# Patient Record
Sex: Female | Born: 1935 | Race: White | Hispanic: No | Marital: Married | Smoking: Never smoker
Health system: Southern US, Community
[De-identification: ages and names within clinical notes are randomized; demographics above are authoritative.]

## PROBLEM LIST (undated history)

## (undated) DIAGNOSIS — R0602 Shortness of breath: Secondary | ICD-10-CM

## (undated) DIAGNOSIS — K219 Gastro-esophageal reflux disease without esophagitis: Secondary | ICD-10-CM

## (undated) DIAGNOSIS — I10 Essential (primary) hypertension: Secondary | ICD-10-CM

## (undated) DIAGNOSIS — H209 Unspecified iridocyclitis: Secondary | ICD-10-CM

## (undated) DIAGNOSIS — J45909 Unspecified asthma, uncomplicated: Secondary | ICD-10-CM

## (undated) DIAGNOSIS — IMO0001 Reserved for inherently not codable concepts without codable children: Secondary | ICD-10-CM

## (undated) HISTORY — DX: Shortness of breath: R06.02

## (undated) HISTORY — PX: NO PAST SURGERIES: SHX2092

---

## 1998-12-16 ENCOUNTER — Other Ambulatory Visit: Admission: RE | Admit: 1998-12-16 | Discharge: 1998-12-16 | Payer: Self-pay | Admitting: *Deleted

## 1999-07-19 ENCOUNTER — Emergency Department (HOSPITAL_COMMUNITY): Admission: EM | Admit: 1999-07-19 | Discharge: 1999-07-19 | Payer: Self-pay

## 2000-05-08 ENCOUNTER — Other Ambulatory Visit: Admission: RE | Admit: 2000-05-08 | Discharge: 2000-05-08 | Payer: Self-pay | Admitting: *Deleted

## 2001-05-09 ENCOUNTER — Other Ambulatory Visit: Admission: RE | Admit: 2001-05-09 | Discharge: 2001-05-09 | Payer: Self-pay | Admitting: Family Medicine

## 2001-05-14 ENCOUNTER — Encounter: Payer: Self-pay | Admitting: Family Medicine

## 2001-05-14 ENCOUNTER — Encounter: Admission: RE | Admit: 2001-05-14 | Discharge: 2001-05-14 | Payer: Self-pay | Admitting: Family Medicine

## 2001-11-09 ENCOUNTER — Encounter: Admission: RE | Admit: 2001-11-09 | Discharge: 2001-11-09 | Payer: Self-pay | Admitting: Family Medicine

## 2001-11-09 ENCOUNTER — Encounter: Payer: Self-pay | Admitting: Family Medicine

## 2004-05-25 ENCOUNTER — Other Ambulatory Visit: Admission: RE | Admit: 2004-05-25 | Discharge: 2004-05-25 | Payer: Self-pay | Admitting: Family Medicine

## 2006-06-19 ENCOUNTER — Other Ambulatory Visit: Admission: RE | Admit: 2006-06-19 | Discharge: 2006-06-19 | Payer: Self-pay | Admitting: Family Medicine

## 2008-02-18 ENCOUNTER — Encounter: Admission: RE | Admit: 2008-02-18 | Discharge: 2008-02-18 | Payer: Self-pay | Admitting: Family Medicine

## 2008-06-16 ENCOUNTER — Encounter: Admission: RE | Admit: 2008-06-16 | Discharge: 2008-06-16 | Payer: Self-pay | Admitting: Family Medicine

## 2008-06-27 ENCOUNTER — Encounter: Admission: RE | Admit: 2008-06-27 | Discharge: 2008-06-27 | Payer: Self-pay | Admitting: Family Medicine

## 2008-12-04 ENCOUNTER — Encounter: Admission: RE | Admit: 2008-12-04 | Discharge: 2008-12-04 | Payer: Self-pay | Admitting: Family Medicine

## 2009-12-14 ENCOUNTER — Encounter: Admission: RE | Admit: 2009-12-14 | Discharge: 2009-12-14 | Payer: Self-pay | Admitting: Family Medicine

## 2010-06-17 ENCOUNTER — Other Ambulatory Visit: Payer: Self-pay | Admitting: Family Medicine

## 2010-06-17 DIAGNOSIS — R918 Other nonspecific abnormal finding of lung field: Secondary | ICD-10-CM

## 2010-06-23 ENCOUNTER — Ambulatory Visit
Admission: RE | Admit: 2010-06-23 | Discharge: 2010-06-23 | Disposition: A | Discharge status: Home / Self Care | Payer: Medicare Other | Source: Ambulatory Visit | Attending: Family Medicine | Admitting: Family Medicine

## 2010-06-23 DIAGNOSIS — R918 Other nonspecific abnormal finding of lung field: Secondary | ICD-10-CM

## 2010-06-23 MED ORDER — IOHEXOL 300 MG/ML  SOLN
75.0000 mL | Freq: Once | INTRAMUSCULAR | Status: AC | PRN
  Administered 2010-06-23: 75 mL via INTRAVENOUS

## 2014-05-27 ENCOUNTER — Encounter (HOSPITAL_COMMUNITY): Payer: Self-pay | Admitting: Family Medicine

## 2014-05-27 ENCOUNTER — Emergency Department (INDEPENDENT_AMBULATORY_CARE_PROVIDER_SITE_OTHER)
Admission: EM | Admit: 2014-05-27 | Discharge: 2014-05-27 | Disposition: A | Discharge status: Home / Self Care | Payer: Medicare Other | Source: Home / Self Care | Attending: Family Medicine | Admitting: Family Medicine

## 2014-05-27 DIAGNOSIS — R079 Chest pain, unspecified: Secondary | ICD-10-CM | POA: Diagnosis not present

## 2014-05-27 DIAGNOSIS — K219 Gastro-esophageal reflux disease without esophagitis: Secondary | ICD-10-CM

## 2014-05-27 DIAGNOSIS — M7918 Myalgia, other site: Secondary | ICD-10-CM

## 2014-05-27 DIAGNOSIS — IMO0001 Reserved for inherently not codable concepts without codable children: Secondary | ICD-10-CM

## 2014-05-27 DIAGNOSIS — M791 Myalgia: Secondary | ICD-10-CM | POA: Diagnosis not present

## 2014-05-27 HISTORY — DX: Unspecified iridocyclitis: H20.9

## 2014-05-27 HISTORY — DX: Essential (primary) hypertension: I10

## 2014-05-27 HISTORY — DX: Reserved for inherently not codable concepts without codable children: IMO0001

## 2014-05-27 HISTORY — DX: Gastro-esophageal reflux disease without esophagitis: K21.9

## 2014-05-27 MED ORDER — GI COCKTAIL ~~LOC~~
30.0000 mL | Freq: Once | ORAL | Status: AC
  Administered 2014-05-27: 30 mL via ORAL

## 2014-05-27 MED ORDER — GI COCKTAIL ~~LOC~~
ORAL | Status: AC
  Filled 2014-05-27: qty 30

## 2014-05-27 NOTE — Discharge Instructions (Signed)
Your chest pain does not appear to be due to your heart. Your likely splinting musculoskeletal pain in the form of costochondritis. Please consider using an anti-inflammatory such as ibuprofen for this pain and Zantac over the next several days. Presented to the emergency room if at any point if worse. Continue using her Zantac for the next several days.

## 2014-05-27 NOTE — ED Notes (Signed)
Reports chest pain with sob.  States within getting to clinic pain is now radiating to back.  States had similar episode on Friday.

## 2014-05-27 NOTE — ED Provider Notes (Signed)
CSN: [id number]     Arrival date & time 05/27/14  1238 History   First MD Initiated Contact with Patient 05/27/14 1249     Chief Complaint  Patient presents with  . Chest Pain   (Consider location/radiation/quality/duration/timing/severity/associated sxs/prior Treatment) HPI  CP started this morning while in bed. Zantac w/o benefit. Sharp. Took breath away/SOB. Center of chest. Started around 05:00. Pain is constant. Non-exertional. Increased work yesterday in the yard. Slept on R side. Still in pain but significantly improved. No radiatio. Back pain which is independent of CP. Denies HA, nausea, lightheadedness, diaphoresis, palpitations, syncope, vomiting, diarrhea, abd pain, dysuria, frequency, rash. Worse w/ certain moveements and breathing deeply.   Past Medical History  Diagnosis Date  . Iritis   . High blood pressure   . Reflux    History reviewed. No pertinent past surgical history. Family History  Problem Relation Age of Onset  . Congestive Heart Failure Father   . Congestive Heart Failure Brother   . Congestive Heart Failure Paternal Grandmother    History  Substance Use Topics  . Smoking status: Never Smoker   . Smokeless tobacco: Not on file  . Alcohol Use: No   OB History    No data available     Review of Systems Per HPI with all other pertinent systems negative.   Allergies  Review of patient's allergies indicates not on file.  Home Medications   Prior to Admission medications   Medication Sig Start Date End Date Taking? Authorizing Provider  famciclovir (FAMVIR) 500 MG tablet Take by mouth 3 (three) times daily.   Yes Historical Provider, MD  hydrochlorothiazide (HYDRODIURIL) 25 MG tablet Take 25 mg by mouth daily.   Yes Historical Provider, MD  prednisoLONE acetate (PRED FORTE) 1 % ophthalmic suspension 1 drop 4 (four) times daily.   Yes Historical Provider, MD  ranitidine (ZANTAC) 150 MG tablet Take 150 mg by mouth 2 (two) times daily.   Yes Historical  Provider, MD  vitamin B-12 (CYANOCOBALAMIN) 1000 MCG tablet Take 1,000 mcg by mouth daily.   Yes Historical Provider, MD   There were no vitals taken for this visit. Physical Exam Physical Exam  Constitutional: frail appearing. oriented to person, place, and time. appears well-developed and well-nourished. No distress.  HENT:  Head: Normocephalic and atraumatic.  Eyes: EOMI. PERRL.  Neck: Normal range of motion.  Cardiovascular: RRR, no m/r/g, 2+ distal pulses,  Pulmonary/Chest: Effort normal and breath sounds normal. No respiratory distress.  Abdominal: Soft. Bowel sounds are normal. NonTTP, no distension.  Musculoskeletal: Chest pain on palpation of costochondral border.  Neurological: alert and oriented to person, place, and time.  Skin: Skin is warm. No rash noted. non diaphoretic.  Psychiatric: normal mood and affect. behavior is normal. Judgment and thought content normal.   ED Course  Procedures (including critical care time) Labs Review Labs Reviewed - No data to display  Imaging Review No results found.  ED ECG REPORT  EKG ordered for CP and independently reviewed   Date: 05/27/2014  Rate: 75  Rhythm: normal sinus rhythm  QRS Axis: left  Intervals: normal  ST/T Wave abnormalities: normal  Conduction Disutrbances:none  Narrative Interpretation:   Old EKG Reviewed: none available  I have personally reviewed the EKG tracing and agree with the computerized printout as noted.  MDM   1. Chest pain, unspecified chest pain type   2. Musculoskeletal pain   3. Reflux    Relatively healthy 81 year old presenting with likely musculoskeletal chest pain.  Very reproducible on palpation. EKG reassuring. Anticipate patient overdid it from a physical standpoint yesterday working out in the yard and then slept on her side causing further irritation of the costochondral border resulting in costochondritis. Patient's chest pain is markedly improved with rest and sitting up. There  may also be a esophagitis component of this is patient with some underlying GERD and examined this morning. no initial benefit but now improving. GI cocktail and office without significantly reduced symptoms. Unlikely to be presenting with ischemic cardio myopathy or dissection, or pleuritic etiology of chest pain. Patient with very strict return precautions and will seek emergent medical attention if needed  Shelly Flatten, MD Family Medicine 05/27/2014, 1:19 PM     Ozella Rocks, MD 05/27/14 1321

## 2014-07-15 ENCOUNTER — Other Ambulatory Visit: Payer: Self-pay | Admitting: Family Medicine

## 2014-07-15 DIAGNOSIS — R1900 Intra-abdominal and pelvic swelling, mass and lump, unspecified site: Secondary | ICD-10-CM

## 2014-07-17 ENCOUNTER — Ambulatory Visit
Admission: RE | Admit: 2014-07-17 | Discharge: 2014-07-17 | Disposition: A | Discharge status: Home / Self Care | Payer: Medicare Other | Source: Ambulatory Visit | Attending: Family Medicine | Admitting: Family Medicine

## 2014-07-17 DIAGNOSIS — R1900 Intra-abdominal and pelvic swelling, mass and lump, unspecified site: Secondary | ICD-10-CM

## 2014-07-17 MED ORDER — IOHEXOL 300 MG/ML  SOLN
100.0000 mL | Freq: Once | INTRAMUSCULAR | Status: AC | PRN
  Administered 2014-07-17: 100 mL via INTRAVENOUS

## 2014-07-22 ENCOUNTER — Other Ambulatory Visit: Payer: Self-pay | Admitting: Family Medicine

## 2014-07-22 ENCOUNTER — Other Ambulatory Visit (HOSPITAL_COMMUNITY): Payer: Self-pay | Admitting: Family Medicine

## 2014-07-22 DIAGNOSIS — R911 Solitary pulmonary nodule: Secondary | ICD-10-CM

## 2014-07-24 ENCOUNTER — Ambulatory Visit (HOSPITAL_COMMUNITY): Payer: Medicare Other

## 2014-07-25 ENCOUNTER — Ambulatory Visit (HOSPITAL_COMMUNITY)
Admission: RE | Admit: 2014-07-25 | Discharge: 2014-07-25 | Disposition: A | Discharge status: Home / Self Care | Payer: Medicare Other | Source: Ambulatory Visit | Attending: Family Medicine | Admitting: Family Medicine

## 2014-07-25 ENCOUNTER — Other Ambulatory Visit: Payer: Medicare Other

## 2014-07-25 DIAGNOSIS — R590 Localized enlarged lymph nodes: Secondary | ICD-10-CM | POA: Diagnosis not present

## 2014-07-25 DIAGNOSIS — R911 Solitary pulmonary nodule: Secondary | ICD-10-CM | POA: Diagnosis present

## 2014-07-25 DIAGNOSIS — I700 Atherosclerosis of aorta: Secondary | ICD-10-CM | POA: Diagnosis not present

## 2014-07-25 DIAGNOSIS — R918 Other nonspecific abnormal finding of lung field: Secondary | ICD-10-CM | POA: Diagnosis not present

## 2014-07-25 DIAGNOSIS — I2510 Atherosclerotic heart disease of native coronary artery without angina pectoris: Secondary | ICD-10-CM | POA: Insufficient documentation

## 2014-07-25 LAB — GLUCOSE, CAPILLARY: Glucose-Capillary: 105 mg/dL — ABNORMAL HIGH (ref 65–99)

## 2014-07-25 MED ORDER — FLUDEOXYGLUCOSE F - 18 (FDG) INJECTION
6.8400 | Freq: Once | INTRAVENOUS | Status: AC | PRN
Start: 2014-07-25 — End: 2014-07-25
  Administered 2014-07-25: 6.84 via INTRAVENOUS

## 2014-07-28 ENCOUNTER — Other Ambulatory Visit: Payer: Self-pay | Admitting: General Surgery

## 2014-07-28 ENCOUNTER — Encounter (HOSPITAL_BASED_OUTPATIENT_CLINIC_OR_DEPARTMENT_OTHER): Payer: Self-pay | Admitting: *Deleted

## 2014-07-28 NOTE — H&P (Signed)
Anne Bradshaw 07/28/2014 11:57 AM Location: [facility] Patient #: [id number] DOB: 07/10/1927 Married / Language: English / Race: White Female History of Present Illness (Lucita Montoya MD; 07/28/2014 4:06 PM) The patient is a 81 year old female who presents with lymphadenopathy. Patient is an 81-year-old female who presents with right inguinal lymphadenopathy. She has had enlarged lymph nodes in her right groin for approximately 2 years. She had been doing some yard work and noted some left-sided lymphadenopathy and a rash in her groin. She has had this seen by multiple people including dermatology. She has had it treated as a fungal infection but this is not resolved to fungal treatment. She later had the left-sided lymphadenopathy resolved and started developing right-sided lymphadenopathy. She is not sure if it has changed since that has been noticed. It is not painful. She continues to have a reddish purplish discoloration of her skin over her mons pubis. This does seem to be extending. It is uncomfortable, but her lymph nodes are not painful. She underwent PET scanning which did not demonstrate any areas of hyper metabolism elsewhere other than in her lungs. She is felt to have patchy infiltrates consistent with a low-grade pneumonia. This is confusing to her because she states that she feels fine and is able to mostly whatever she wants. She picks her vegetables out of her garden without any significant difficulty. Other Problems (Sonya Bynum, CMA; 07/28/2014 11:57 AM) Asthma Gastroesophageal Reflux Disease Hemorrhoids High blood pressure Kidney Stone Migraine Headache  Past Surgical History (Sonya Bynum, CMA; 07/28/2014 11:57 AM) No pertinent past surgical history  Diagnostic Studies History (Sonya Bynum, CMA; 07/28/2014 11:57 AM) Colonoscopy never Mammogram >3 years ago Pap Smear >5 years ago  Allergies (Sonya Bynum, CMA; 07/28/2014 11:59 AM) No Known Drug  Allergies 07/28/2014  Medication History (Sonya Bynum, CMA; 07/28/2014 11:59 AM) Famciclovir (500MG Tablet, Oral) Active. Hydrochlorothiazide (25MG Tablet, Oral) Active. Ketoconazole (2% Cream, External) Active. Fluticasone Propionate (0.05% Cream, External) Active. Medications Reconciled  Social History (Sonya Bynum, CMA; 07/28/2014 11:57 AM) Caffeine use Coffee. No alcohol use No drug use Tobacco use Never smoker.  Family History (Sonya Bynum, CMA; 07/28/2014 11:57 AM) Cerebrovascular Accident Father, Mother. Heart Disease Brother, Father. Hypertension Brother, Daughter, Father.  Pregnancy / Birth History (Sonya Bynum, CMA; 07/28/2014 11:57 AM) Age at menarche 16 years. Age of menopause 46-50 Gravida 4 Maternal age 15-20 Para 3     Review of Systems (Sonya Bynum CMA; 07/28/2014 11:57 AM) General Present- Weight Loss. Not Present- Appetite Loss, Chills, Fatigue, Fever, Night Sweats and Weight Gain. Skin Not Present- Change in Wart/Mole, Dryness, Hives, Jaundice, New Lesions, Non-Healing Wounds, Rash and Ulcer. HEENT Present- Seasonal Allergies, Visual Disturbances and Wears glasses/contact lenses. Not Present- Earache, Hearing Loss, Hoarseness, Nose Bleed, Oral Ulcers, Ringing in the Ears, Sinus Pain, Sore Throat and Yellow Eyes. Respiratory Present- Snoring. Not Present- Bloody sputum, Chronic Cough, Difficulty Breathing and Wheezing. Breast Not Present- Breast Mass, Breast Pain, Nipple Discharge and Skin Changes. Cardiovascular Present- Leg Cramps. Not Present- Chest Pain, Difficulty Breathing Lying Down, Palpitations, Rapid Heart Rate, Shortness of Breath and Swelling of Extremities. Gastrointestinal Not Present- Abdominal Pain, Bloating, Bloody Stool, Change in Bowel Habits, Chronic diarrhea, Constipation, Difficulty Swallowing, Excessive gas, Gets full quickly at meals, Hemorrhoids, Indigestion, Nausea, Rectal Pain and Vomiting. Female Genitourinary Not Present-  Frequency, Nocturia, Painful Urination, Pelvic Pain and Urgency. Musculoskeletal Not Present- Back Pain, Joint Pain, Joint Stiffness, Muscle Pain, Muscle Weakness and Swelling of Extremities. Neurological Not Present- Decreased Memory,   Fainting, Headaches, Numbness, Seizures, Tingling, Tremor, Trouble walking and Weakness. Psychiatric Not Present- Anxiety, Bipolar, Change in Sleep Pattern, Depression, Fearful and Frequent crying. Endocrine Present- Cold Intolerance and Hair Changes. Not Present- Excessive Hunger, Heat Intolerance, Hot flashes and New Diabetes. Hematology Present- Gland problems. Not Present- Easy Bruising, Excessive bleeding, HIV and Persistent Infections.  Vitals (Sonya Bynum CMA; 07/28/2014 11:58 AM) 07/28/2014 11:58 AM Weight: 105.6 lb Height: 60in Body Surface Area: 1.42 m Body Mass Index: 20.62 kg/m Temp.: 97.4F(Temporal)  Pulse: 50 (Regular)  BP: 132/80 (Sitting, Left Arm, Standard)     Physical Exam (Arleta Ostrum MD; 07/28/2014 4:07 PM)  General Mental Status-Alert. General Appearance-Consistent with stated age. Hydration-Well hydrated. Voice-Normal.  Integumentary Note: Reddish purplish discoloration that blanches. She has edema over her mons. She has no peripheral edema.   Head and Neck Head-normocephalic, atraumatic with no lesions or palpable masses. Trachea-midline. Thyroid Gland Characteristics - normal size and consistency.  Eye Eyeball - Bilateral-Extraocular movements intact. Sclera/Conjunctiva - Bilateral-No scleral icterus.  Chest and Lung Exam Chest and lung exam reveals -quiet, even and easy respiratory effort with no use of accessory muscles and on auscultation, normal breath sounds, no adventitious sounds and normal vocal resonance. Inspection Chest Wall - Normal. Back - normal.  Cardiovascular Cardiovascular examination reveals -normal heart sounds, regular rate and rhythm with no murmurs and normal  pedal pulses bilaterally.  Abdomen Inspection Inspection of the abdomen reveals - No Hernias. Palpation/Percussion Palpation and Percussion of the abdomen reveal - Soft, Non Tender, No Rebound tenderness, No Rigidity (guarding) and No hepatosplenomegaly. Auscultation Auscultation of the abdomen reveals - Bowel sounds normal.  Neurologic Neurologic evaluation reveals -alert and oriented x 3 with no impairment of recent or remote memory. Mental Status-Normal.  Musculoskeletal Global Assessment -Note:no gross deformities.  Normal Exam - Left-Upper Extremity Strength Normal and Lower Extremity Strength Normal. Normal Exam - Right-Upper Extremity Strength Normal and Lower Extremity Strength Normal.  Lymphatic Head & Neck  General Head & Neck Lymphatics: Bilateral - Description - Normal. Axillary  General Axillary Region: Bilateral - Description - Normal. Tenderness - Non Tender. Femoral & Inguinal  Generalized Femoral & Inguinal Lymphatics: Bilateral - Description - Localized lymphadenopathy(Right matted lymphadenopathy.).    Assessment & Plan (Yanni Quiroa MD; 07/28/2014 4:08 PM)  LYMPHADENOPATHY, INGUINAL (785.6  R59.0) Impression: We will plan for a right inguinal lymph node biopsy. I discussed the risk of the procedure including bleeding, infection, damage to adjacent structure, seroma, lymphocele, leg swelling, non-diagnosis, possible heart-lung complications, and others.  Current Plans Schedule for Surgery Pt Education - Lymph Nodes, Enlarged: enlarged gland RASH OF UNKNOWN CAUSE (782.1  R21) Impression: She is asleep, I will do a skin biopsy of the affected area since she has had this treated for several years without result. 

## 2014-07-29 ENCOUNTER — Ambulatory Visit (HOSPITAL_BASED_OUTPATIENT_CLINIC_OR_DEPARTMENT_OTHER): Payer: Medicare Other | Admitting: Anesthesiology

## 2014-07-29 ENCOUNTER — Encounter (HOSPITAL_BASED_OUTPATIENT_CLINIC_OR_DEPARTMENT_OTHER)
Admission: RE | Disposition: A | Discharge status: Home / Self Care | Payer: Self-pay | Source: Ambulatory Visit | Attending: General Surgery

## 2014-07-29 ENCOUNTER — Encounter (HOSPITAL_BASED_OUTPATIENT_CLINIC_OR_DEPARTMENT_OTHER): Payer: Self-pay | Admitting: Anesthesiology

## 2014-07-29 ENCOUNTER — Ambulatory Visit (HOSPITAL_BASED_OUTPATIENT_CLINIC_OR_DEPARTMENT_OTHER)
Admission: RE | Admit: 2014-07-29 | Discharge: 2014-07-29 | Disposition: A | Discharge status: Home / Self Care | Payer: Medicare Other | Source: Ambulatory Visit | Attending: General Surgery | Admitting: General Surgery

## 2014-07-29 DIAGNOSIS — I889 Nonspecific lymphadenitis, unspecified: Secondary | ICD-10-CM | POA: Insufficient documentation

## 2014-07-29 DIAGNOSIS — I10 Essential (primary) hypertension: Secondary | ICD-10-CM | POA: Diagnosis not present

## 2014-07-29 DIAGNOSIS — K219 Gastro-esophageal reflux disease without esophagitis: Secondary | ICD-10-CM | POA: Diagnosis not present

## 2014-07-29 DIAGNOSIS — R599 Enlarged lymph nodes, unspecified: Secondary | ICD-10-CM | POA: Diagnosis present

## 2014-07-29 HISTORY — DX: Gastro-esophageal reflux disease without esophagitis: K21.9

## 2014-07-29 HISTORY — PX: SKIN BIOPSY: SHX1

## 2014-07-29 HISTORY — PX: LYMPH NODE BIOPSY: SHX201

## 2014-07-29 HISTORY — DX: Unspecified asthma, uncomplicated: J45.909

## 2014-07-29 LAB — POCT I-STAT, CHEM 8
BUN: 15 mg/dL (ref 6–20)
CHLORIDE: 102 mmol/L (ref 101–111)
Calcium, Ion: 1.19 mmol/L (ref 1.13–1.30)
Creatinine, Ser: 0.8 mg/dL (ref 0.44–1.00)
GLUCOSE: 104 mg/dL — AB (ref 65–99)
HCT: 40 % (ref 36.0–46.0)
Hemoglobin: 13.6 g/dL (ref 12.0–15.0)
Potassium: 3.8 mmol/L (ref 3.5–5.1)
Sodium: 140 mmol/L (ref 135–145)
TCO2: 25 mmol/L (ref 0–100)

## 2014-07-29 SURGERY — LYMPH NODE BIOPSY
Anesthesia: General | Site: Abdomen | Laterality: Right

## 2014-07-29 MED ORDER — MIDAZOLAM HCL 2 MG/2ML IJ SOLN: 1.0000 mg | INTRAMUSCULAR | Status: DC | PRN

## 2014-07-29 MED ORDER — LIDOCAINE-EPINEPHRINE (PF) 1 %-1:200000 IJ SOLN
INTRAMUSCULAR | Status: DC | PRN
  Administered 2014-07-29: 12 mL

## 2014-07-29 MED ORDER — OXYCODONE HCL 5 MG/5ML PO SOLN
5.0000 mg | Freq: Once | ORAL | Status: AC | PRN
Start: 2014-07-29 — End: 2014-07-29

## 2014-07-29 MED ORDER — GLYCOPYRROLATE 0.2 MG/ML IJ SOLN: 0.2000 mg | Freq: Once | INTRAMUSCULAR | Status: DC | PRN

## 2014-07-29 MED ORDER — CEFAZOLIN SODIUM-DEXTROSE 2-3 GM-% IV SOLR
INTRAVENOUS | Status: AC
  Filled 2014-07-29: qty 50

## 2014-07-29 MED ORDER — OXYCODONE HCL 5 MG PO TABS
5.0000 mg | ORAL_TABLET | Freq: Once | ORAL | Status: AC | PRN
  Administered 2014-07-29: 5 mg via ORAL

## 2014-07-29 MED ORDER — FENTANYL CITRATE (PF) 100 MCG/2ML IJ SOLN
INTRAMUSCULAR | Status: AC
  Filled 2014-07-29: qty 2

## 2014-07-29 MED ORDER — SODIUM CHLORIDE 0.9 % IV SOLN: 250.0000 mL | INTRAVENOUS | Status: DC | PRN

## 2014-07-29 MED ORDER — FENTANYL CITRATE (PF) 100 MCG/2ML IJ SOLN
INTRAMUSCULAR | Status: AC
  Filled 2014-07-29: qty 4

## 2014-07-29 MED ORDER — FENTANYL CITRATE (PF) 100 MCG/2ML IJ SOLN
25.0000 ug | INTRAMUSCULAR | Status: DC | PRN
  Administered 2014-07-29 (×2): 25 ug via INTRAVENOUS

## 2014-07-29 MED ORDER — ACETAMINOPHEN 650 MG RE SUPP: 650.0000 mg | RECTAL | Status: DC | PRN

## 2014-07-29 MED ORDER — ONDANSETRON HCL 4 MG/2ML IJ SOLN
INTRAMUSCULAR | Status: DC | PRN
  Administered 2014-07-29: 4 mg via INTRAVENOUS

## 2014-07-29 MED ORDER — LACTATED RINGERS IV SOLN
INTRAVENOUS | Status: DC
  Administered 2014-07-29 (×2): via INTRAVENOUS

## 2014-07-29 MED ORDER — MIDAZOLAM HCL 2 MG/2ML IJ SOLN
INTRAMUSCULAR | Status: AC
  Filled 2014-07-29: qty 2

## 2014-07-29 MED ORDER — CEFAZOLIN SODIUM-DEXTROSE 2-3 GM-% IV SOLR
2.0000 g | INTRAVENOUS | Status: AC
  Administered 2014-07-29: 2 g via INTRAVENOUS

## 2014-07-29 MED ORDER — SODIUM CHLORIDE 0.9 % IJ SOLN: 3.0000 mL | Freq: Two times a day (BID) | INTRAMUSCULAR | Status: DC

## 2014-07-29 MED ORDER — OXYCODONE HCL 5 MG PO TABS: 5.0000 mg | ORAL_TABLET | ORAL | Status: DC | PRN

## 2014-07-29 MED ORDER — FENTANYL CITRATE (PF) 100 MCG/2ML IJ SOLN: 50.0000 ug | INTRAMUSCULAR | Status: DC | PRN

## 2014-07-29 MED ORDER — SODIUM CHLORIDE 0.9 % IJ SOLN: 3.0000 mL | INTRAMUSCULAR | Status: DC | PRN

## 2014-07-29 MED ORDER — OXYCODONE HCL 5 MG PO TABS
ORAL_TABLET | ORAL | Status: AC
Start: 2014-07-29 — End: 2014-07-29
  Filled 2014-07-29: qty 1

## 2014-07-29 MED ORDER — TRAMADOL HCL 50 MG PO TABS: 50.0000 mg | ORAL_TABLET | Freq: Four times a day (QID) | ORAL | Status: DC | PRN

## 2014-07-29 MED ORDER — PROPOFOL INFUSION 10 MG/ML OPTIME
INTRAVENOUS | Status: DC | PRN
  Administered 2014-07-29: 25 ug/kg/min via INTRAVENOUS

## 2014-07-29 MED ORDER — FENTANYL CITRATE (PF) 100 MCG/2ML IJ SOLN
INTRAMUSCULAR | Status: DC | PRN
  Administered 2014-07-29 (×2): 25 ug via INTRAVENOUS

## 2014-07-29 MED ORDER — LIDOCAINE-EPINEPHRINE (PF) 1 %-1:200000 IJ SOLN
INTRAMUSCULAR | Status: AC
  Filled 2014-07-29: qty 10

## 2014-07-29 MED ORDER — ACETAMINOPHEN 325 MG PO TABS: 650.0000 mg | ORAL_TABLET | ORAL | Status: DC | PRN

## 2014-07-29 MED ORDER — ONDANSETRON HCL 4 MG/2ML IJ SOLN: 4.0000 mg | Freq: Once | INTRAMUSCULAR | Status: DC | PRN

## 2014-07-29 SURGICAL SUPPLY — 48 items
BLADE HEX COATED 2.75 (ELECTRODE) ×2 IMPLANT
BLADE SURG 10 STRL SS (BLADE) IMPLANT
BLADE SURG 15 STRL LF DISP TIS (BLADE) ×1 IMPLANT
BLADE SURG 15 STRL SS (BLADE) ×2
CANISTER SUCT 1200ML W/VALVE (MISCELLANEOUS) IMPLANT
CHLORAPREP W/TINT 26ML (MISCELLANEOUS) ×2 IMPLANT
CLIP TI MEDIUM 6 (CLIP) ×4 IMPLANT
COVER BACK TABLE 60X90IN (DRAPES) ×2 IMPLANT
COVER MAYO STAND STRL (DRAPES) ×2 IMPLANT
DECANTER SPIKE VIAL GLASS SM (MISCELLANEOUS) IMPLANT
DRAPE LAPAROTOMY 100X72 PEDS (DRAPES) ×2 IMPLANT
DRAPE UTILITY XL STRL (DRAPES) ×2 IMPLANT
DRSG TELFA 3X8 NADH (GAUZE/BANDAGES/DRESSINGS) ×2 IMPLANT
ELECT REM PT RETURN 9FT ADLT (ELECTROSURGICAL) ×2
ELECTRODE REM PT RTRN 9FT ADLT (ELECTROSURGICAL) ×1 IMPLANT
GLOVE BIO SURGEON STRL SZ 6 (GLOVE) ×2 IMPLANT
GLOVE BIO SURGEON STRL SZ 6.5 (GLOVE) ×1 IMPLANT
GLOVE BIOGEL PI IND STRL 6.5 (GLOVE) ×1 IMPLANT
GLOVE BIOGEL PI IND STRL 7.0 (GLOVE) IMPLANT
GLOVE BIOGEL PI INDICATOR 6.5 (GLOVE) ×1
GLOVE BIOGEL PI INDICATOR 7.0 (GLOVE) ×1
GLOVE ECLIPSE 6.5 STRL STRAW (GLOVE) ×1 IMPLANT
GOWN STRL REUS W/ TWL LRG LVL3 (GOWN DISPOSABLE) ×1 IMPLANT
GOWN STRL REUS W/TWL 2XL LVL3 (GOWN DISPOSABLE) ×2 IMPLANT
GOWN STRL REUS W/TWL LRG LVL3 (GOWN DISPOSABLE) ×2
LIQUID BAND (GAUZE/BANDAGES/DRESSINGS) ×2 IMPLANT
NDL HYPO 25X1 1.5 SAFETY (NEEDLE) ×1 IMPLANT
NEEDLE HYPO 25X1 1.5 SAFETY (NEEDLE) ×2 IMPLANT
NS IRRIG 1000ML POUR BTL (IV SOLUTION) ×1 IMPLANT
PACK BASIN DAY SURGERY FS (CUSTOM PROCEDURE TRAY) ×2 IMPLANT
PAD DRESSING TELFA 3X8 NADH (GAUZE/BANDAGES/DRESSINGS) IMPLANT
PENCIL BUTTON HOLSTER BLD 10FT (ELECTRODE) ×2 IMPLANT
PUNCH BIOPSY DERMAL 3MM (MISCELLANEOUS) ×1 IMPLANT
SLEEVE SCD COMPRESS KNEE MED (MISCELLANEOUS) IMPLANT
SPONGE LAP 18X18 X RAY DECT (DISPOSABLE) ×2 IMPLANT
STAPLER VISISTAT 35W (STAPLE) IMPLANT
SUT MON AB 4-0 PC3 18 (SUTURE) ×1 IMPLANT
SUT VIC AB 2-0 SH 27 (SUTURE) ×2
SUT VIC AB 2-0 SH 27XBRD (SUTURE) IMPLANT
SUT VIC AB 3-0 54X BRD REEL (SUTURE) IMPLANT
SUT VIC AB 3-0 BRD 54 (SUTURE)
SUT VIC AB 3-0 SH 27 (SUTURE) ×2
SUT VIC AB 3-0 SH 27X BRD (SUTURE) IMPLANT
SYR CONTROL 10ML LL (SYRINGE) ×2 IMPLANT
TOWEL OR 17X24 6PK STRL BLUE (TOWEL DISPOSABLE) ×2 IMPLANT
TOWEL OR NON WOVEN STRL DISP B (DISPOSABLE) ×2 IMPLANT
TUBE CONNECTING 20X1/4 (TUBING) ×1 IMPLANT
YANKAUER SUCT BULB TIP NO VENT (SUCTIONS) ×1 IMPLANT

## 2014-07-29 NOTE — Transfer of Care (Signed)
Immediate Anesthesia Transfer of Care Note  Patient: Anne Bradshaw  Procedure(s) Performed: Procedure(s): RIGHT INGUINAL LYMPH NODE BIOPSY (Right) SKIN PUNCH BIOPSY (Right)  Patient Location: PACU  Anesthesia Type:MAC  Level of Consciousness: awake and patient cooperative  Airway & Oxygen Therapy: Patient Spontanous Breathing  Post-op Assessment: Report given to RN and Post -op Vital signs reviewed and stable  Post vital signs: Reviewed and stable  Last Vitals:  Filed Vitals:   07/29/14 1208  BP: 170/65  Pulse: 57  Temp: 36.7 C  Resp: 16    Complications: No apparent anesthesia complications

## 2014-07-29 NOTE — Anesthesia Postprocedure Evaluation (Signed)
Anesthesia Post Note  Patient: Anne Bradshaw  Procedure(s) Performed: Procedure(s) (LRB): RIGHT INGUINAL LYMPH NODE BIOPSY (Right) SKIN PUNCH BIOPSY (Right)  Anesthesia type: MAC  Patient location: PACU  Post pain: Pain level controlled and Adequate analgesia  Post assessment: Post-op Vital signs reviewed, Patient's Cardiovascular Status Stable and Respiratory Function Stable  Last Vitals:  Filed Vitals:   07/29/14 1615  BP: 154/48  Pulse: 68  Temp: 36.7 C  Resp: 20    Post vital signs: Reviewed and stable  Level of consciousness: awake, alert  and oriented  Complications: No apparent anesthesia complications

## 2014-07-29 NOTE — Anesthesia Procedure Notes (Signed)
Procedure Name: MAC Date/Time: 07/29/2014 1:43 PM Performed by: Malverne DesanctisLINKA, [facility] Pre-anesthesia Checklist: Patient identified, Timeout performed, Emergency Drugs available, Suction available and Patient being monitored Patient Re-evaluated:Patient Re-evaluated prior to inductionOxygen Delivery Method: Simple face mask

## 2014-07-29 NOTE — Interval H&P Note (Signed)
History and Physical Interval Note:  07/29/2014 12:04 PM  Anne Bradshaw  has presented today for surgery, with the diagnosis of LYMPHADENAPATHY, RASH  The various methods of treatment have been discussed with the patient and family. After consideration of risks, benefits and other options for treatment, the patient has consented to  Procedure(s): RIGHT INGUINAL LYMPH NODE BIOPSY (Right) SKIN PUNCH BIOPSY (N/A) as a surgical intervention .  The patient's history has been reviewed, patient examined, no change in status, stable for surgery.  I have reviewed the patient's chart and labs.  Questions were answered to the patient's satisfaction.     Royden Bulman

## 2014-07-29 NOTE — Discharge Instructions (Addendum)
[facility] °Office Phone Number [phone] ° ° POST OP INSTRUCTIONS ° °Always review your discharge instruction sheet given to you by the facility where your surgery was performed. ° °IF YOU HAVE DISABILITY OR FAMILY LEAVE FORMS, YOU MUST BRING THEM TO THE OFFICE FOR PROCESSING.  DO NOT GIVE THEM TO YOUR DOCTOR. ° °1. A prescription for pain medication may be given to you upon discharge.  Take your pain medication as prescribed, if needed.  If narcotic pain medicine is not needed, then you may take acetaminophen (Tylenol) or ibuprofen (Advil) as needed. °2. Take your usually prescribed medications unless otherwise directed °3. If you need a refill on your pain medication, please contact your pharmacy.  They will contact our office to request authorization.  Prescriptions will not be filled after 5pm or on week-ends. °4. You should eat very light the first 24 hours after surgery, such as soup, crackers, pudding, etc.  Resume your normal diet the day after surgery °5. It is common to experience some constipation if taking pain medication after surgery.  Increasing fluid intake and taking a stool softener will usually help or prevent this problem from occurring.  A mild laxative (Milk of Magnesia or Miralax) should be taken according to package directions if there are no bowel movements after 48 hours. °6. You may shower in 48 hours.  The surgical glue will flake off in 2-3 weeks.   °7. ACTIVITIES:  No strenuous activity or heavy lifting for 1 week.   °a. You may drive when you no longer are taking prescription pain medication, you can comfortably wear a seatbelt, and you can safely maneuver your car and apply brakes. °b. RETURN TO WORK:  _________1 week_____________ °You should see your doctor in the office for a follow-up appointment approximately three-four weeks after your surgery.   ° °WHEN TO CALL YOUR DOCTOR: °1. Fever over 101.0 °2. Nausea and/or vomiting. °3. Extreme swelling or  bruising. °4. Continued bleeding from incision. °5. Increased pain, redness, or drainage from the incision. ° °The clinic staff is available to answer your questions during regular business hours.  Please don’t hesitate to call and ask to speak to one of the nurses for clinical concerns.  If you have a medical emergency, go to the nearest emergency room or call 911.  A surgeon from [facility] is always on call at the hospital. ° °For further questions, please visit [email or web address]  ° ° °Post Anesthesia Home Care Instructions ° °Activity: °Get plenty of rest for the remainder of the day. A responsible adult should stay with you for 24 hours following the procedure.  °For the next 24 hours, DO NOT: °-Drive a car °-Operate machinery °-Drink alcoholic beverages °-Take any medication unless instructed by your physician °-Make any legal decisions or sign important papers. ° °Meals: °Start with liquid foods such as gelatin or soup. Progress to regular foods as tolerated. Avoid greasy, spicy, heavy foods. If nausea and/or vomiting occur, drink only clear liquids until the nausea and/or vomiting subsides. Call your physician if vomiting continues. ° °Special Instructions/Symptoms: °Your throat may feel dry or sore from the anesthesia or the breathing tube placed in your throat during surgery. If this causes discomfort, gargle with warm salt water. The discomfort should disappear within 24 hours. ° °If you had a scopolamine patch placed behind your ear for the management of post- operative nausea and/or vomiting: ° °1. The medication in the patch is effective for 72 hours, after which it should   be removed.  Wrap patch in a tissue and discard in the trash. Wash hands thoroughly with soap and water. °2. You may remove the patch earlier than 72 hours if you experience unpleasant side effects which may include dry mouth, dizziness or visual disturbances. °3. Avoid touching the patch. Wash your hands with  soap and water after contact with the patch. °  ° ° °

## 2014-07-29 NOTE — Anesthesia Preprocedure Evaluation (Signed)
Anesthesia Evaluation  Patient identified by MRN, date of birth, ID band Patient awake    Reviewed: Allergy & Precautions, NPO status , Patient's Chart, lab work & pertinent test results  Airway Mallampati: II   Neck ROM: full    Dental   Pulmonary asthma ,  breath sounds clear to auscultation        Cardiovascular hypertension, Rhythm:regular Rate:Normal     Neuro/Psych    GI/Hepatic GERD-  ,  Endo/Other    Renal/GU      Musculoskeletal   Abdominal   Peds  Hematology   Anesthesia Other Findings   Reproductive/Obstetrics                             Anesthesia Physical Anesthesia Plan  ASA: II  Anesthesia Plan: General   Post-op Pain Management:    Induction: Intravenous  Airway Management Planned: LMA  Additional Equipment:   Intra-op Plan:   Post-operative Plan:   Informed Consent: I have reviewed the patients History and Physical, chart, labs and discussed the procedure including the risks, benefits and alternatives for the proposed anesthesia with the patient or authorized representative who has indicated his/her understanding and acceptance.     Plan Discussed with: CRNA, Anesthesiologist and Surgeon  Anesthesia Plan Comments:         Anesthesia Quick Evaluation

## 2014-07-29 NOTE — H&P (View-Only) (Signed)
Anne Bradshaw 07/28/2014 11:57 AM Location: [facility] Patient #: [id number] DOB: [birth year] Married / Language: [place] / Race: White Female History of Present Illness Anne Lint MD; 07/28/2014 4:06 PM) The patient is a 81 year old female who presents with lymphadenopathy. Patient is an 81 year old female who presents with right inguinal lymphadenopathy. She has had enlarged lymph nodes in her right groin for approximately 2 years. She had been doing some yard work and noted some left-sided lymphadenopathy and a rash in her groin. She has had this seen by multiple people including dermatology. She has had it treated as a fungal infection but this is not resolved to fungal treatment. She later had the left-sided lymphadenopathy resolved and started developing right-sided lymphadenopathy. She is not sure if it has changed since that has been noticed. It is not painful. She continues to have a reddish purplish discoloration of her skin over her mons pubis. This does seem to be extending. It is uncomfortable, but her lymph nodes are not painful. She underwent PET scanning which did not demonstrate any areas of hyper metabolism elsewhere other than in her lungs. She is felt to have patchy infiltrates consistent with a low-grade pneumonia. This is confusing to her because she states that she feels fine and is able to mostly whatever she wants. She picks her vegetables out of her garden without any significant difficulty. Other Problems Anne Bradshaw, CMA; 07/28/2014 11:57 AM) Asthma Gastroesophageal Reflux Disease Hemorrhoids High blood pressure Kidney Stone Migraine Headache  Past Surgical History Anne Bradshaw, CMA; 07/28/2014 11:57 AM) No pertinent past surgical history  Diagnostic Studies History Anne Bradshaw, CMA; 07/28/2014 11:57 AM) Colonoscopy never Mammogram >3 years ago Pap Smear >5 years ago  Allergies Anne Bradshaw, CMA; 07/28/2014 11:59 AM) No Known Drug  Allergies 07/28/2014  Medication History (Anne Bradshaw, CMA; 07/28/2014 11:59 AM) Famciclovir (  Tablet, Oral) Active. Hydrochlorothiazide (  Tablet, Oral) Active. Ketoconazole (2% Cream, External) Active. Fluticasone Propionate (0.05% Cream, External) Active. Medications Reconciled  Social History Anne Bradshaw, CMA; 07/28/2014 11:57 AM) Caffeine use Coffee. No alcohol use No drug use Tobacco use Never smoker.  Family History Anne Bradshaw, CMA; 07/28/2014 11:57 AM) Cerebrovascular Accident Father, Mother. Heart Disease Brother, Father. Hypertension Brother, Daughter, Father.  Pregnancy / Birth History Anne Bradshaw, CMA; 07/28/2014 11:57 AM) Age at menarche 16 years. Age of menopause 60-50 Gravida 4 Maternal age 48-20 Para 3     Review of Systems Anne Bradshaw CMA; 07/28/2014 11:57 AM) General Present- Weight Loss. Not Present- Appetite Loss, Chills, Fatigue, Fever, Night Sweats and Weight Gain. Skin Not Present- Change in Wart/Mole, Dryness, Hives, Jaundice, New Lesions, Non-Healing Wounds, Rash and Ulcer. HEENT Present- Seasonal Allergies, Visual Disturbances and Wears glasses/contact lenses. Not Present- Earache, Hearing Loss, Hoarseness, Nose Bleed, Oral Ulcers, Ringing in the Ears, Sinus Pain, Sore Throat and Yellow Eyes. Respiratory Present- Snoring. Not Present- Bloody sputum, Chronic Cough, Difficulty Breathing and Wheezing. Breast Not Present- Breast Mass, Breast Pain, Nipple Discharge and Skin Changes. Cardiovascular Present- Leg Cramps. Not Present- Chest Pain, Difficulty Breathing Lying Down, Palpitations, Rapid Heart Rate, Shortness of Breath and Swelling of Extremities. Gastrointestinal Not Present- Abdominal Pain, Bloating, Bloody Stool, Change in Bowel Habits, Chronic diarrhea, Constipation, Difficulty Swallowing, Excessive gas, Gets full quickly at meals, Hemorrhoids, Indigestion, Nausea, Rectal Pain and Vomiting. Female Genitourinary Not Present-  Frequency, Nocturia, Painful Urination, Pelvic Pain and Urgency. Musculoskeletal Not Present- Back Pain, Joint Pain, Joint Stiffness, Muscle Pain, Muscle Weakness and Swelling of Extremities. Neurological Not Present- Decreased Memory,  Fainting, Headaches, Numbness, Seizures, Tingling, Tremor, Trouble walking and Weakness. Psychiatric Not Present- Anxiety, Bipolar, Change in Sleep Pattern, Depression, Fearful and Frequent crying. Endocrine Present- Cold Intolerance and Hair Changes. Not Present- Excessive Hunger, Heat Intolerance, Hot flashes and New Diabetes. Hematology Present- Gland problems. Not Present- Easy Bruising, Excessive bleeding, HIV and Persistent Infections.  Vitals (Anne Bradshaw CMA; 07/28/2014 11:58 AM) 07/28/2014 11:58 AM Weight: 105.6 lb Height: 60in Body Surface Area: 1.42 m Body Mass Index: 20.62 kg/m Temp.: 97.39F(Temporal)  Pulse: 50 (Regular)  BP: 132/80 (Sitting, Left Arm, Standard)     Physical Exam Anne Bradshaw(Anne Valtierra MD; 07/28/2014 4:07 PM)  General Mental Status-Alert. General Appearance-Consistent with stated age. Hydration-Well hydrated. Voice-Normal.  Integumentary Note: Reddish purplish discoloration that blanches. She has edema over her mons. She has no peripheral edema.   Head and Neck Head-normocephalic, atraumatic with no lesions or palpable masses. Trachea-midline. Thyroid Gland Characteristics - normal size and consistency.  Eye Eyeball - Bilateral-Extraocular movements intact. Sclera/Conjunctiva - Bilateral-No scleral icterus.  Chest and Lung Exam Chest and lung exam reveals -quiet, even and easy respiratory effort with no use of accessory muscles and on auscultation, normal breath sounds, no adventitious sounds and normal vocal resonance. Inspection Chest Wall - Normal. Back - normal.  Cardiovascular Cardiovascular examination reveals -normal heart sounds, regular rate and rhythm with no murmurs and normal  pedal pulses bilaterally.  Abdomen Inspection Inspection of the abdomen reveals - No Hernias. Palpation/Percussion Palpation and Percussion of the abdomen reveal - Soft, Non Tender, No Rebound tenderness, No Rigidity (guarding) and No hepatosplenomegaly. Auscultation Auscultation of the abdomen reveals - Bowel sounds normal.  Neurologic Neurologic evaluation reveals -alert and oriented x 3 with no impairment of recent or remote memory. Mental Status-Normal.  Musculoskeletal Global Assessment -Note:no gross deformities.  Normal Exam - Left-Upper Extremity Strength Normal and Lower Extremity Strength Normal. Normal Exam - Right-Upper Extremity Strength Normal and Lower Extremity Strength Normal.  Lymphatic Head & Neck  General Head & Neck Lymphatics: Bilateral - Description - Normal. Axillary  General Axillary Region: Bilateral - Description - Normal. Tenderness - Non Tender. Femoral & Inguinal  Generalized Femoral & Inguinal Lymphatics: Bilateral - Description - Localized lymphadenopathy(Right matted lymphadenopathy.).    Assessment & Plan Anne Bradshaw(Janeann Paisley MD; 07/28/2014 4:08 PM)  LYMPHADENOPATHY, INGUINAL (785.6  R59.0) Impression: We will plan for a right inguinal lymph node biopsy. I discussed the risk of the procedure including bleeding, infection, damage to adjacent structure, seroma, lymphocele, leg swelling, non-diagnosis, possible heart-lung complications, and others.  Current Plans Schedule for Surgery Pt Education - Lymph Nodes, Enlarged: enlarged gland RASH OF UNKNOWN CAUSE (782.1  R21) Impression: She is asleep, I will do a skin biopsy of the affected area since she has had this treated for several years without result.

## 2014-07-29 NOTE — Op Note (Signed)
Pre op diagnosis:  Lymphadenopathy, rash  Post op diagnosis:  Same  Procedure performed:  Right inguinal lymph node biopsy, skin punch biopsy  Surgeon:  Almond LintFaera Javier Gell, MD  Anesthesia:  MAC and local  Findings:  Matted inguinal lymph nodes, boggy macular rash.   EBL:  Minimal  Specimen:  Right inguinal lymph node for lymphoma workup  Procedure:   Patient was identified in the holding area and taken to the operating room and placed supine on the operating room table.  MAC anesthesia was induced. The patient abdomen and groin was clipped, prepped and draped in sterile fashion.  Time out was performed according to the surgical safety check list.  When all was correct, we continued.  The patient's platelets were finishing running in as we started.  A line was marked for the incision in the right groin.  This was infiltrated with local anesthetic.  The skin was incised with a #10 blade.  The subcutaneous tissues were divided with the cautery.  A Wietlaner retractor was used to assist with visualization. Scarpa's fascia was opened.    A very large lymph node was immediately apparent.  This was elevated with a Babcock clamp, but this was difficult as the nodes were fixed and matted.  Hemaclips were used to ligate the lymphovascular channels entering the node from all sides.  The deep attachments were tied off. Once this was complete, the node was passed off for lymphoma workup and for fungal/AFB stains/culture.    The cavity was irrigated copiously.  Hemostasis was achieved with the cautery.  The wound was closed with running 2-0 vicryl,  interrupted 3-0 Vicryl deep dermal sutures and 4-0 Monocryl running subcuticular suture.  The wound was cleaned, dried and dressed with Dermabond.    The 3 mm punch biopsy was used to obtain several representative areas of the rash in the groin region over the mons pubis.  This was also stitched closed, cleaned, dried, and dressed with dermabond.    The patient  was awakened from anesthesia and taken to the PACU in stable condition.  Needle, sponge, and instrument counts were correct.

## 2014-07-31 ENCOUNTER — Encounter (HOSPITAL_BASED_OUTPATIENT_CLINIC_OR_DEPARTMENT_OTHER): Payer: Self-pay | Admitting: General Surgery

## 2014-08-01 LAB — TISSUE CULTURE
CULTURE: NO GROWTH
Culture: NO GROWTH

## 2014-08-02 ENCOUNTER — Telehealth: Payer: Self-pay | Admitting: General Surgery

## 2014-08-02 NOTE — Telephone Encounter (Signed)
error 

## 2014-08-26 LAB — FUNGUS CULTURE W SMEAR
Fungal Smear: NONE SEEN
Fungal Smear: NONE SEEN

## 2014-09-10 LAB — AFB CULTURE WITH SMEAR (NOT AT ARMC)
ACID FAST SMEAR: NONE SEEN
ACID FAST SMEAR: NONE SEEN

## 2014-09-16 ENCOUNTER — Ambulatory Visit: Payer: Medicare Other | Admitting: Internal Medicine

## 2014-09-25 ENCOUNTER — Other Ambulatory Visit: Payer: Self-pay | Admitting: Internal Medicine

## 2014-09-25 ENCOUNTER — Ambulatory Visit (INDEPENDENT_AMBULATORY_CARE_PROVIDER_SITE_OTHER): Payer: Medicare Other | Admitting: Internal Medicine

## 2014-09-25 ENCOUNTER — Encounter: Payer: Self-pay | Admitting: Internal Medicine

## 2014-09-25 VITALS — BP 173/74 | HR 72 | Temp 98.0°F | Ht 64.0 in | Wt 108.0 lb

## 2014-09-25 DIAGNOSIS — I10 Essential (primary) hypertension: Secondary | ICD-10-CM | POA: Diagnosis not present

## 2014-09-25 DIAGNOSIS — I881 Chronic lymphadenitis, except mesenteric: Secondary | ICD-10-CM

## 2014-09-25 DIAGNOSIS — I888 Other nonspecific lymphadenitis: Secondary | ICD-10-CM

## 2014-09-26 LAB — TOXOPLASMA GONDII ANTIBODY, IGG: Toxoplasma IgG Ratio: <3 IU/mL (ref <7.2)

## 2014-09-27 DIAGNOSIS — I881 Chronic lymphadenitis, except mesenteric: Secondary | ICD-10-CM | POA: Insufficient documentation

## 2014-09-27 DIAGNOSIS — I10 Essential (primary) hypertension: Secondary | ICD-10-CM | POA: Insufficient documentation

## 2014-09-27 DIAGNOSIS — I888 Other nonspecific lymphadenitis: Secondary | ICD-10-CM | POA: Insufficient documentation

## 2014-09-27 NOTE — Progress Notes (Signed)
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   MRN: [id number]  HPI 81yo F with HTN, hx of shingles, who was in good state of health until roughly last year where she noticed having erythema, possible rash across her lower abdomen with associated lymphadenopathy. She was gardening at the time and doesn't recall having been punctured by any thorns. She was seen by her pcp at the time and was give a course of antivirals thinking it was a flare of shingles. Since it did not improve, she was then given a course of antifungals for presumed yeast infection. She states that over the next few months the erythema to the area improved with exception to lymph nodes that would occasionally become inflammed.At the end of May, roughly 12 months out from the initial occurrence, she had ab/pelvic CT that showed pelvic LAD concerning for lymphoma then went onto having PET scan that had uptake in pulmonary region as well as pelvic LN area. She was referred to Dr. Maisie Fus for LN dissection. She underwent LN biopsy in early June with tissue sent for pathology as well as culture. Her path report revealed granulomatous lymphadenitis. 2 months out none of her cultures were positive for fungal or afb. It is unclear if she had pulmonary symptoms in early June to determine if findings on PET were a pulmonary infectious process. She is feeling back to her baseline health. She is concerned that the pelvic lymphadenopathy will worsen.  i have reviewed old records, pathology reports, as well as personally reviewed abd CT abn noted from CT in 06/2014  No Known Allergies Current Outpatient Prescriptions on File Prior to Visit  Medication Sig Dispense Refill  . famciclovir (FAMVIR) 500 MG tablet Take 500 mg by mouth daily.     . hydrochlorothiazide (HYDRODIURIL) 25 MG tablet Take 25 mg by mouth daily.    . prednisoLONE acetate (PRED FORTE) 1 %  ophthalmic suspension Place 1 drop into both eyes every other day.     . Probiotic Product (PRO-BIOTIC BLEND PO) Take by mouth every evening.    . vitamin B-12 (CYANOCOBALAMIN) 1000 MCG tablet Take 1,000 mcg by mouth daily.     No current facility-administered medications on file prior to visit.   Active Ambulatory Problems    Diagnosis Date Noted  . No Active Ambulatory Problems   Resolved Ambulatory Problems    Diagnosis Date Noted  . No Resolved Ambulatory Problems   Past Medical History  Diagnosis Date  . Iritis   . High blood pressure   . Reflux   . Asthma   . GERD (gastroesophageal reflux disease)    History  Substance Use Topics  . Smoking status: Never Smoker   . Smokeless tobacco: Never Used  . Alcohol Use: No  family history includes Congestive Heart Failure in her brother, father, and paternal grandmother.   Review of Systems   Constitutional: Negative for fever, chills, diaphoresis, activity change, appetite change, fatigue and unexpected weight change.  HENT: Negative for congestion, sore throat, rhinorrhea, sneezing, trouble swallowing and sinus pressure.  Eyes: Negative for photophobia and visual disturbance.  Respiratory: Negative for cough, chest tightness, shortness of breath, wheezing and stridor.  Cardiovascular: Negative for chest pain, palpitations and leg swelling.  Gastrointestinal: Negative for nausea, vomiting, abdominal pain, diarrhea, constipation, blood in stool, abdominal distention and anal bleeding.  Genitourinary: Negative for dysuria, hematuria, flank pain and difficulty urinating.  Musculoskeletal: Negative for myalgias, back pain, joint swelling, arthralgias and gait problem.  Skin: Negative for color change, pallor, rash and wound.  Neurological: Negative for dizziness, tremors, weakness and light-headedness.  Hematological: Negative for adenopathy. Does not bruise/bleed easily.  Psychiatric/Behavioral: Negative for behavioral problems,  confusion, sleep disturbance, dysphoric mood, decreased concentration and agitation.       Objective:   Physical Exam BP 173/74 mmHg  Pulse 72  Temp(Src) 98 F (36.7 C) (Oral)  Ht  (1.626 m)  Wt 108 lb (48.988 kg)  BMI 18.53 kg/m2 Physical Exam  Constitutional:  oriented to person, place, and time. appears well-developed and well-nourished. No distress.  HENT: [place]/AT, PERRLA, no scleral icterus Mouth/Throat: Oropharynx is clear and moist. No oropharyngeal exudate.  Cardiovascular: Normal rate, regular rhythm and normal heart sounds. Exam reveals no gallop and no friction rub.  No murmur heard.  Pulmonary/Chest: Effort normal and breath sounds normal. No respiratory distress.  has no wheezes.  Neck = supple, no nuchal rigidity Abdominal: Soft. Bowel sounds are normal.  exhibits no distension. There is no tenderness. + marked right sided inguinal lymphadenopathy Lymphadenopathy: no cervical adenopathy. No axillary adenopathy Neurological: alert and oriented to person, place, and time.  Skin: Skin is warm and dry. No rash noted. No erythema.  Psychiatric: a normal mood and affect.  behavior is normal.       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 If those are negative, we will revisit PET pulmonary findings to see if this atypical NTM infection. We are fortunate that fungal and afb cutlures were done on the tissue biopsy from early June. Those remain negative, which is reassuring.  rtc in 4-6 wk.  Spent 60 min with greater than 50% in reviewing records, counseling on findings and implications for treatment   Lakesa Coste B. Drue Second MD MPH [facility] [phone]

## 2014-09-29 LAB — NEUTROPHIL OXIDATIVE BURST: % OXIDATION POSITIVE NEUTROPHILS: 98 %

## 2014-09-29 LAB — HISTOPLASMA ANTIBODIES: HISTOPLASMA AB ID: NEGATIVE

## 2014-10-01 LAB — BARTONELLA ANTIBODY PANEL

## 2014-10-01 LAB — BARTONELLA ANITBODY PANEL
B HENSELAE IGG: NEGATIVE
B HENSELAE IGM: NEGATIVE

## 2014-10-29 ENCOUNTER — Telehealth: Payer: Self-pay | Admitting: *Deleted

## 2014-10-29 ENCOUNTER — Encounter: Payer: Self-pay | Admitting: *Deleted

## 2014-10-29 NOTE — Telephone Encounter (Signed)
Patient would like a call about her results from 09/25/14. She is very frustrated that she has to reschedule her appt. Advised her will have a doctor call her asap but that her labs look normal.

## 2014-10-29 NOTE — Telephone Encounter (Signed)
Error

## 2014-10-30 ENCOUNTER — Ambulatory Visit: Payer: Medicare Other | Admitting: Internal Medicine

## 2014-10-31 NOTE — Telephone Encounter (Signed)
Will call her today

## 2014-11-04 ENCOUNTER — Other Ambulatory Visit: Payer: Self-pay | Admitting: Physician Assistant

## 2014-12-11 ENCOUNTER — Ambulatory Visit
Admission: RE | Admit: 2014-12-11 | Discharge: 2014-12-11 | Disposition: A | Discharge status: Home / Self Care | Payer: Medicare Other | Source: Ambulatory Visit | Attending: Internal Medicine | Admitting: Internal Medicine

## 2014-12-11 ENCOUNTER — Encounter: Payer: Self-pay | Admitting: Internal Medicine

## 2014-12-11 ENCOUNTER — Ambulatory Visit (INDEPENDENT_AMBULATORY_CARE_PROVIDER_SITE_OTHER): Payer: Medicare Other | Admitting: Internal Medicine

## 2014-12-11 VITALS — BP 159/70 | HR 69 | Temp 98.7°F | Wt 105.0 lb

## 2014-12-11 DIAGNOSIS — R918 Other nonspecific abnormal finding of lung field: Secondary | ICD-10-CM

## 2014-12-11 DIAGNOSIS — L920 Granuloma annulare: Secondary | ICD-10-CM | POA: Diagnosis not present

## 2014-12-11 DIAGNOSIS — J929 Pleural plaque without asbestos: Secondary | ICD-10-CM

## 2014-12-11 DIAGNOSIS — J941 Fibrothorax: Secondary | ICD-10-CM | POA: Diagnosis not present

## 2014-12-11 NOTE — Progress Notes (Signed)
Subjective:    Patient ID: Anne Bradshaw, female    DOB: 11/29/1927, 81 y.o.   MRN: [id number]  HPI  81yo F with hx of HTN, who started to notice having areas of localized swelling to LN near her groin roughly 2 years ago. It was initially preceded by a rash to her groin that she noticed after gardening. She was initially treated for zoster, but the patient had felt it was different from the time she did have an episode of shingles.  She had undergone surgical biopsy in June that showed granuloma then referred to ID clinic for granulomatous lymphadenitis. CX at that time for fungal and AFB was negative. Imaging by CT abd/pelvis also showed some abnormality to lower lung fields, thus she had PET scan that showed uptake to lung fields that was concerning for multifocal pna - which corresponded to her having symptoms at that time for possible respiratory infections. In early September, she was seen at  Dr. Dorita SciaraLupton's office, dermatology, where repeat biopsy of one of the lesions of the mons pubis showed 9/3 path: PALISADED GRANULOMA, FAVOR GRANULOMA ANNULARE. Again path did not see any fungal elements. Stains negative.   She has been feeling fine, no chills, fever, weightloss, no cough. When I had seen her roughly 2.5 months ago, I was concerned that this may have been NTM disseminated infection or other unusual infectious process. Blood work testing for endemic molds/bartonella were negative.   At this point, the patient is applying topical steroids to the affected area. She still has a 4cm long by 2cm wide endurated area of right inguinal area. Not warm, no fluctuance, not tender  She was told by dermatology to follow up at [facility] but she would prefer not to drive so far.  No Known Allergies Current Outpatient Prescriptions on File Prior to Visit  Medication Sig Dispense Refill  . famciclovir (FAMVIR) 500 MG tablet Take 500 mg by mouth daily.     . hydrochlorothiazide (HYDRODIURIL) 25 MG tablet  Take 25 mg by mouth daily.    Bertram Gala. Polyethyl Glycol-Propyl Glycol (SYSTANE) 0.4-0.3 % SOLN Apply to eye 4 (four) times daily as needed.    . prednisoLONE acetate (PRED FORTE) 1 % ophthalmic suspension Place 1 drop into both eyes every other day.     . Probiotic Product (PRO-BIOTIC BLEND PO) Take by mouth every evening.    . vitamin B-12 (CYANOCOBALAMIN) 1000 MCG tablet Take 1,000 mcg by mouth daily.    Marland Kitchen. triamcinolone lotion (KENALOG) 0.1 % Apply 1 application topically 2 (two) times daily.     No current facility-administered medications on file prior to visit.   Active Ambulatory Problems    Diagnosis Date Noted  . Essential hypertension 09/27/2014  . 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 No Resolved Ambulatory Problems   Past Medical History  Diagnosis Date  . Iritis   . High blood pressure   . Reflux   . Asthma   . GERD (gastroesophageal reflux disease)    Social History  Substance Use Topics  . Smoking status: Never Smoker   . Smokeless tobacco: Never Used  . Alcohol Use: No  family history includes Congestive Heart Failure in her brother, father, and paternal grandmother.  Review of Systems Review of Systems  Constitutional: Negative for fever, chills, diaphoresis, activity change, appetite change, fatigue and unexpected weight change.  HENT: Negative for congestion, sore throat, rhinorrhea, sneezing, trouble swallowing and sinus  pressure.  Eyes: Negative for photophobia and visual disturbance.  Respiratory: Negative for cough, chest tightness, shortness of breath, wheezing and stridor.  Cardiovascular: Negative for chest pain, palpitations and leg swelling.  Gastrointestinal: Negative for nausea, vomiting, abdominal pain, diarrhea, constipation, blood in stool, abdominal distention and anal bleeding.  Genitourinary: Negative for dysuria, hematuria, flank pain and difficulty urinating.  Musculoskeletal: Negative  for myalgias, back pain, joint swelling, arthralgias and gait problem.  Skin: Negative for color change, pallor, rash and wound.  Neurological: Negative for dizziness, tremors, weakness and light-headedness.  Hematological: Negative for adenopathy. Does not bruise/bleed easily.  Psychiatric/Behavioral: Negative for behavioral problems, confusion, sleep disturbance, dysphoric mood, decreased concentration and agitation.       Objective:   Physical Exam BP 159/70 mmHg  Pulse 69  Temp(Src) 98.7 F (37.1 C) (Oral)  Wt 105 lb (47.628 kg) Physical Exam  Constitutional:  oriented to person, place, and time. appears well-developed and well-nourished. No distress.  HENT: [place]/AT, PERRLA, no scleral icterus Mouth/Throat: Oropharynx is clear and moist. No oropharyngeal exudate.  Cardiovascular: Normal rate, regular rhythm and normal heart sounds. Exam reveals no gallop and no friction rub.  No murmur heard.  Pulmonary/Chest: Effort normal and breath sounds normal. No respiratory distress.  has no wheezes.  Neck = supple, no nuchal rigidity Abdominal: Soft. Bowel sounds are normal.  exhibits no distension. There is no tenderness.  Lymphadenopathy: no cervical adenopathy. No axillary adenopathy / GU = 4cm long by 2cm wide endurated area of right inguinal area. Not warm, no fluctuance, not tender Neurological: alert and oriented to person, place, and time.  Skin: Skin is warm and dry. No rash noted. No erythema.  Psychiatric: a normal mood and affect.  behavior is normal.   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 Concern that she still may have pulmonary process. We will get cxr today, and get chest ct to compare prior studies. Pending results may need to refer to pulmonology  Need to call [facility] to see why he referred her to [facility], patient not interested in going to  [facility], possibly due to distance.  Continue with steroid as prescribed by dr. Dorita Sciara office

## 2014-12-15 ENCOUNTER — Telehealth: Payer: Self-pay | Admitting: *Deleted

## 2014-12-15 NOTE — Telephone Encounter (Signed)
Patient called and wants to results of her chest xrays. Advised her will have the doctor give her a call or I will call her back once I speak with the doctor. Also asked for a copy to be mailed. Advised her the doctor will be in the office tomorrow and I will put results in the mail soon.

## 2014-12-24 ENCOUNTER — Telehealth: Payer: Self-pay

## 2014-12-24 NOTE — Telephone Encounter (Signed)
Patient is calling regarding medication given to her by Tollie Ethena Anderson, PA for  klebsiella pneumoniae . She was given Septra DS  Take two daily for 10 days.  She wants to make sure this is the correct medicine and that she has Dr Feliz BeamSnider's approval to take medication.   Per patient Dareen Pianonderson, [place] was suppose to fax office note for Dr Drue SecondSnider to review.   Please advise.   Laurell Josephsammy K Kitai Purdom, RN

## 2014-12-25 NOTE — Telephone Encounter (Signed)
Does sounds ok but not sure what we are treating. i will be out of office on Friday. If she is being treated for uti, would cut it down to 7 days. If being treated for pneumonia would treat for 5 days

## 2015-01-08 ENCOUNTER — Ambulatory Visit (INDEPENDENT_AMBULATORY_CARE_PROVIDER_SITE_OTHER): Payer: Medicare Other | Admitting: Internal Medicine

## 2015-01-08 ENCOUNTER — Encounter: Payer: Self-pay | Admitting: Internal Medicine

## 2015-01-08 VITALS — BP 169/69 | HR 79 | Temp 98.5°F | Wt 103.0 lb

## 2015-01-08 DIAGNOSIS — L920 Granuloma annulare: Secondary | ICD-10-CM

## 2015-01-08 DIAGNOSIS — B961 Klebsiella pneumoniae [K. pneumoniae] as the cause of diseases classified elsewhere: Secondary | ICD-10-CM

## 2015-01-08 DIAGNOSIS — A498 Other bacterial infections of unspecified site: Secondary | ICD-10-CM

## 2015-01-08 LAB — CBC WITH DIFFERENTIAL/PLATELET
Basophils Absolute: 0.1 10*3/uL (ref 0.0–0.1)
Basophils Relative: 1 % (ref 0–1)
EOS PCT: 3 % (ref 0–5)
Eosinophils Absolute: 0.2 10*3/uL (ref 0.0–0.7)
HCT: 38.9 % (ref 36.0–46.0)
HEMOGLOBIN: 12.8 g/dL (ref 12.0–15.0)
LYMPHS ABS: 1.4 10*3/uL (ref 0.7–4.0)
LYMPHS PCT: 28 % (ref 12–46)
MCH: 31.2 pg (ref 26.0–34.0)
MCHC: 32.9 g/dL (ref 30.0–36.0)
MCV: 94.9 fL (ref 78.0–100.0)
MONOS PCT: 12 % (ref 3–12)
MPV: 9.6 fL (ref 8.6–12.4)
Monocytes Absolute: 0.6 10*3/uL (ref 0.1–1.0)
Neutro Abs: 2.9 10*3/uL (ref 1.7–7.7)
Neutrophils Relative %: 56 % (ref 43–77)
Platelets: 282 10*3/uL (ref 150–400)
RBC: 4.1 MIL/uL (ref 3.87–5.11)
RDW: 13.8 % (ref 11.5–15.5)
WBC: 5.1 10*3/uL (ref 4.0–10.5)

## 2015-01-08 NOTE — Progress Notes (Signed)
RFV: granuloma annulare Subjective:    Patient ID: Anne Bradshaw, female    DOB: 11/25/1927, 81 y.o.   MRN: [id number]  HPI  81yo F with HTN, hx of shingles, who was in good state of health until Summer of 2015 where she noticed having erythema, possible rash across her mons pubis with associated right inguinal lymphadenopathy. She was gardening at the time and doesn't recall having been punctured by any thorns. She was seen by her pcp at the time and was give a course of antivirals thinking it was a flare of shingles. Since it did not improve, she was then given a course of antifungals for presumed yeast infection. She states that over the next few months the erythema to the area improved with exception to lymph nodes that would occasionally become inflammed. At the end of May 2016, roughly 12 months out from the initial occurrence, she had ab/pelvic CT that showed pelvic LAD concerning for lymphoma then went onto having PET scan that had uptake in pulmonary region as well as pelvic LN area. She was referred to Dr. Romie LeveeAlicia Thomas for LN dissection. She underwent LN biopsy in early June with tissue sent for pathology as well as fungal and AFB culture. Her path report revealed granulomatous lymphadenitis. The AFB and fungal cultures remained NGTD. It is unclear if she had pulmonary symptoms in early June to determine if findings on PET were a pulmonary infectious process. She is feeling back to her baseline health.  She also has been seen by her dermatologist group, with Dr. Terri PiedraLupton, where they had repeat FNA of the right inguinal mass with path report suggesting granuloma annulare. She has been on varying topical steroid ( 2wks on, 2 wk off) without relief. Her last tissue aspirate was also sent for aerobic culture for which it isolated pansensitive kleb pneumonaie. She was given 2 wk course of bactrim DS bid which she just finished. At her last visit with us at end of October, we repeated her CXR to  determine if need to refer to pulmonology for further evaluation of pulmonary lesions. CXR showed resolution of findings from PET scan earlier in the year.  At last appt, Dr Dorita SciaraLupton's team mentioned referring patient to [facility] but patient would prefer to stay local  i have reviewed old records, pathology reports, as well as cx results. Personally reviewed CXR that shows clear lung fields   She feels that her inguinal lesion is unchanged. Hyperpigmented area of mons pubis is also unchanged Review of Systems  Constitutional: Negative for fever, chills, diaphoresis, activity change, appetite change, fatigue and unexpected weight change.  HENT: Negative for congestion, sore throat, rhinorrhea, sneezing, trouble swallowing and sinus pressure.  Eyes: Negative for photophobia and visual disturbance.  Respiratory: Negative for cough, chest tightness, shortness of breath, wheezing and stridor.  Cardiovascular: Negative for chest pain, palpitations and leg swelling.  Gastrointestinal: Negative for nausea, vomiting, abdominal pain, diarrhea, constipation, blood in stool, abdominal distention and anal bleeding.  Genitourinary: Negative for dysuria, hematuria, flank pain and difficulty urinating.  Musculoskeletal: Negative for myalgias, back pain, joint swelling, arthralgias and gait problem.  Skin: Negative for color change, pallor, rash and wound.  Neurological: Negative for dizziness, tremors, weakness and light-headedness.  Hematological: Negative for adenopathy. Does not bruise/bleed easily.  Psychiatric/Behavioral: Negative for behavioral problems, confusion, sleep disturbance, dysphoric mood, decreased concentration and agitation.       Objective:   Physical Exam BP 169/69 mmHg  Pulse 79  Temp(Src) 98.5 F (36.9  C) (Oral)  Wt 103 lb (46.72 kg) gen = elderly female in NAD Skin = mons pubis region hyperpigmented and in the right inguinal region there is indurated matted area that measures 4.5cm  long x 2 cm height     Assessment & Plan:    Referral derm for 2nd opinion on management of granuloma annulare of inguinal region.  Klebsiella + cx = i think was likely a contaminant, but patient already received treatment. Will check bmp to ensure no kidney injury from bactrim.

## 2015-01-09 ENCOUNTER — Telehealth: Payer: Self-pay | Admitting: *Deleted

## 2015-01-09 NOTE — Telephone Encounter (Signed)
Referral faxed to [facility]. They asked that notes, demographics, and insurance info be faxed prior to an appointment being given. They will notify the patient and this office once an appt is scheduled. Patient aware Anne MolaJacqueline Cockerham

## 2015-01-12 ENCOUNTER — Other Ambulatory Visit: Payer: Self-pay | Admitting: *Deleted

## 2015-03-03 ENCOUNTER — Ambulatory Visit: Payer: Medicare Other | Admitting: Internal Medicine

## 2015-03-17 ENCOUNTER — Encounter: Payer: Self-pay | Admitting: Internal Medicine

## 2015-03-17 ENCOUNTER — Ambulatory Visit (INDEPENDENT_AMBULATORY_CARE_PROVIDER_SITE_OTHER): Payer: Medicare Other | Admitting: Internal Medicine

## 2015-03-17 VITALS — BP 158/79 | HR 69 | Temp 98.1°F | Wt 105.0 lb

## 2015-03-17 DIAGNOSIS — L920 Granuloma annulare: Secondary | ICD-10-CM

## 2015-03-17 NOTE — Progress Notes (Signed)
  Rfv: management of granuloma annulare of inguinal region Subjective:    Patient ID: Anne Bradshaw, female    DOB: 23-Jan-1928, 82 y.o.   MRN: [id number]  HPI  82yo F with inguinal granuloma annulare.has not followed up with dermatology. Area feels unchanged. Occasional twinge of pain but overall size is unchanged.  Current Outpatient Prescriptions on File Prior to Visit  Medication Sig Dispense Refill  . famciclovir (FAMVIR) 500 MG tablet Take 500 mg by mouth daily.     . hydrochlorothiazide (HYDRODIURIL) 25 MG tablet Take 25 mg by mouth daily.    Bertram Gala Glycol-Propyl Glycol (SYSTANE) 0.4-0.3 % SOLN Apply to eye 4 (four) times daily as needed.    . prednisoLONE acetate (PRED FORTE) 1 % ophthalmic suspension Place 1 drop into both eyes every other day.     . Probiotic Product (PRO-BIOTIC BLEND PO) Take by mouth every evening.    . triamcinolone lotion (KENALOG) 0.1 % Apply 1 application topically 2 (two) times daily.    . vitamin B-12 (CYANOCOBALAMIN) 1000 MCG tablet Take 1,000 mcg by mouth daily.     No current facility-administered medications on file prior to visit.   Active Ambulatory Problems    Diagnosis Date Noted  . Essential hypertension 09/27/2014  . 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 No Resolved Ambulatory Problems   Past Medical History  Diagnosis Date  . Iritis   . High blood pressure   . Reflux   . Asthma   . GERD (gastroesophageal reflux disease)     Review of Systems + seasonal allergies. Otherwise 10 point ros is negative    Objective:   Physical Exam BP 158/79 mmHg  Pulse 69  Temp(Src) 98.1 F (36.7 C) (Oral)  Wt 105 lb (47.628 kg) Gen:  A x o by 3 in NAD Skin: Left inguinal area 4 cm long by 2cm firm area, non mobile, small 1 x 2 cm inferomedial node inferior to ligament. Mons pubis area appears thickened, still purplelish hue     Assessment & Plan:   granulomatous  lymphadenitis = will refer to [facility] dermatology for 2nd opinion to guide management if she needs intralesion steroid injection vs. cream

## 2015-03-25 ENCOUNTER — Telehealth: Payer: Self-pay | Admitting: *Deleted

## 2015-03-25 NOTE — Telephone Encounter (Signed)
Patient called and wanted to let the doctor know that she is being seen by dermatology and per their conversation she will not need to follow up here. Advised her will let the doctor know and she can call back if she needs to be seen in the future.

## 2015-06-18 ENCOUNTER — Ambulatory Visit: Payer: Medicare Other | Admitting: Internal Medicine

## 2018-03-03 ENCOUNTER — Encounter (HOSPITAL_COMMUNITY): Payer: Self-pay | Admitting: Emergency Medicine

## 2018-03-03 ENCOUNTER — Emergency Department (HOSPITAL_COMMUNITY): Payer: Medicare Other

## 2018-03-03 ENCOUNTER — Emergency Department (HOSPITAL_COMMUNITY)
Admission: EM | Admit: 2018-03-03 | Discharge: 2018-03-03 | Disposition: A | Discharge status: Home / Self Care | Payer: Medicare Other | Attending: Emergency Medicine | Admitting: Emergency Medicine

## 2018-03-03 DIAGNOSIS — Y929 Unspecified place or not applicable: Secondary | ICD-10-CM | POA: Diagnosis not present

## 2018-03-03 DIAGNOSIS — W010XXA Fall on same level from slipping, tripping and stumbling without subsequent striking against object, initial encounter: Secondary | ICD-10-CM | POA: Diagnosis not present

## 2018-03-03 DIAGNOSIS — Y9389 Activity, other specified: Secondary | ICD-10-CM | POA: Insufficient documentation

## 2018-03-03 DIAGNOSIS — S3993XA Unspecified injury of pelvis, initial encounter: Secondary | ICD-10-CM | POA: Diagnosis present

## 2018-03-03 DIAGNOSIS — Y999 Unspecified external cause status: Secondary | ICD-10-CM | POA: Insufficient documentation

## 2018-03-03 DIAGNOSIS — Z79899 Other long term (current) drug therapy: Secondary | ICD-10-CM | POA: Diagnosis not present

## 2018-03-03 DIAGNOSIS — J45909 Unspecified asthma, uncomplicated: Secondary | ICD-10-CM | POA: Diagnosis not present

## 2018-03-03 DIAGNOSIS — S300XXA Contusion of lower back and pelvis, initial encounter: Secondary | ICD-10-CM | POA: Insufficient documentation

## 2018-03-03 DIAGNOSIS — W19XXXA Unspecified fall, initial encounter: Secondary | ICD-10-CM

## 2018-03-03 NOTE — Discharge Instructions (Addendum)
You can take Tylenol 650 mg every 4 hours as needed for pain.  We suggest that you get a cane to prevent falls.  Call your primary care physician to be seen if having significant pain by next week.  Return if concern for any reason

## 2018-03-03 NOTE — ED Notes (Addendum)
PT AMBULATED IN THE ROOM WITH A MILD UNSTEADY GAIT. DR. Ethelda Chick PRESENT.

## 2018-03-03 NOTE — ED Notes (Signed)
Patient transported to CT, unable to obtain vital signs at the moment

## 2018-03-03 NOTE — ED Triage Notes (Signed)
Pt reports that she was bent over getting something under the bed when she lost her balance and fell. Pt c/o bilat hip pain and neck pain. Denies LOC.

## 2018-03-03 NOTE — ED Notes (Signed)
Patient transported to CT 

## 2018-03-03 NOTE — ED Notes (Signed)
PT DISCHARGED. INSTRUCTIONS GIVEN. AAOX4. PT IN NO APPARENT DISTRESS WITH MILD PAIN. THE OPPORTUNITY TO ASK QUESTIONS WAS PROVIDED. 

## 2018-03-03 NOTE — ED Provider Notes (Addendum)
[facility] DEPT Provider Note   CSN: [id number] Arrival date & time: 03/03/18  1733     History   Chief Complaint Chief Complaint  Patient presents with  . Fall  . Neck Pain  . Hip Pain    HPI Anne Bradshaw is a 85 y.o. female.  She was squatting over to pick something up from underneath the bed approximately 5 PM today when she fell over.  She complains of diffuse pelvic pain since the fall.  She had some right sided neck pain after the fall which has since resolved she also complained of slight pain at right axilla after the fall which is also resolved she treated herself with ibuprofen.  Nothing makes symptoms better or worse.  No other associated symptoms she was feeling well before the fall.  She treated herself with Advil prior to coming here.  Denies hitting her head denies syncope or lightheadedness  HPI  Past Medical History:  Diagnosis Date  . Asthma    as child  . GERD (gastroesophageal reflux disease)   . High blood pressure   . Iritis   . Reflux     Patient Active Problem List   Diagnosis Date Noted  . Essential hypertension 09/27/2014  . 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 LYMPH NODE BIOPSY Right 07/29/2014   Procedure: RIGHT INGUINAL LYMPH NODE BIOPSY;  Surgeon: Stark Klein, MD;  Location: [facility];  Service: General;  Laterality: Right;  . NO PAST SURGERIES    . SKIN BIOPSY Right 07/29/2014   Procedure: SKIN PUNCH BIOPSY;  Surgeon: Stark Klein, MD;  Location: [facility];  Service: General;  Laterality: Right;     OB History   No obstetric history on file.      Home Medications    Prior to Admission medications   Medication Sig Start Date End Date Taking? Authorizing Provider  famciclovir (FAMVIR) 500 MG tablet Take 500 mg by mouth daily.     [provider]  hydrochlorothiazide (HYDRODIURIL) 25 MG tablet Take 25  mg by mouth daily.    [provider]  Polyethyl Glycol-Propyl Glycol (SYSTANE) 0.4-0.3 % SOLN Apply to eye 4 (four) times daily as needed.    [provider]  prednisoLONE acetate (PRED FORTE) 1 % ophthalmic suspension Place 1 drop into both eyes every other day.     [provider]  Probiotic Product (PRO-BIOTIC BLEND PO) Take by mouth every evening.    [provider]  triamcinolone lotion (KENALOG) 0.1 % Apply 1 application topically 2 (two) times daily.    [provider]  vitamin B-12 (CYANOCOBALAMIN) 1000 MCG tablet Take 1,000 mcg by mouth daily.    [provider]    Family History Family History  Problem Relation Age of Onset  . Congestive Heart Failure Father   . Congestive Heart Failure Brother   . Congestive Heart Failure Paternal Grandmother     Social History Social History   Tobacco Use  . Smoking status: Never Smoker  . Smokeless tobacco: Never Used  Substance Use Topics  . Alcohol use: No    Alcohol/week: 0.0 standard drinks  . Drug use: No     Allergies   Patient has no known allergies.   Review of Systems Review of Systems  Musculoskeletal: Positive for arthralgias.       Occasionally has knee pain with walking.  None today  All  other systems reviewed and are negative.    Physical Exam Updated Vital Signs BP (!) 186/66 (BP Location: Right Arm)   Pulse 74   Temp (!) 97.4 F (36.3 C) (Oral)   Resp 19   Ht '5\' 4"'  (1.626 m)   Wt 49.4 kg   SpO2 94%   BMI 18.71 kg/m   Physical Exam Vitals signs and nursing note reviewed.  Constitutional:      General: She is not in acute distress.    Appearance: Normal appearance. She is well-developed.  HENT:     Head: Normocephalic and atraumatic.  Eyes:     Conjunctiva/sclera: Conjunctivae normal.     Pupils: Pupils are equal, round, and reactive to light.  Neck:     Musculoskeletal: Normal range of motion and neck supple. No muscular tenderness.      Thyroid: No thyromegaly.     Trachea: No tracheal deviation.  Cardiovascular:     Rate and Rhythm: Normal rate and regular rhythm.     Heart sounds: No murmur.  Pulmonary:     Effort: Pulmonary effort is normal.     Breath sounds: Normal breath sounds.  Chest:     Chest wall: No tenderness.  Abdominal:     General: Bowel sounds are normal. There is no distension.     Palpations: Abdomen is soft.     Tenderness: There is no abdominal tenderness.  Musculoskeletal: Normal range of motion.        General: No tenderness.     Comments: Entire spine is nontender.  Pelvis is tender on compression anterior to posterior.  She is tender over pubic symphysis.  She is not tender on lateral to medial compression.  Bilateral hips nontender.  She has no pain on internal or external rotation of either thigh.  Skin:    General: Skin is warm and dry.     Findings: No rash.  Neurological:     Mental Status: She is alert and oriented to person, place, and time.     Coordination: Coordination normal.     Comments: Motor strength 5/5 overall cranial nerves II through XII grossly intact.  Moves all extremities well.      ED Treatments / Results  Labs (all labs ordered are listed, but only abnormal results are displayed) Labs Reviewed - No data to display  EKG None  Radiology No results found.  Procedures Procedures (including critical care time)  Medications Ordered in ED Medications - No data to display 7:40 PM patient states pain is improved since she took Advil at home.  She is able to walk unassisted. Results for orders placed or performed in visit on 01/08/15  CBC with Differential/Platelet  Result Value Ref Range   WBC 5.1 4.0 - 10.5 K/uL   RBC 4.10 3.87 - 5.11 MIL/uL   Hemoglobin 12.8 12.0 - 15.0 g/dL   HCT 38.9 36.0 - 46.0 %   MCV 94.9 78.0 - 100.0 fL   MCH 31.2 26.0 - 34.0 pg   MCHC 32.9 30.0 - 36.0 g/dL   RDW 13.8 11.5 - 15.5 %   Platelets 282 150 - 400 K/uL   MPV 9.6 8.6 -  12.4 fL   Neutrophils Relative % 56 43 - 77 %   Neutro Abs 2.9 1.7 - 7.7 K/uL   Lymphocytes Relative 28 12 - 46 %   Lymphs Abs 1.4 0.7 - 4.0 K/uL   Monocytes Relative 12 3 - 12 %   Monocytes Absolute 0.6  0.1 - 1.0 K/uL   Eosinophils Relative 3 0 - 5 %   Eosinophils Absolute 0.2 0.0 - 0.7 K/uL   Basophils Relative 1 0 - 1 %   Basophils Absolute 0.1 0.0 - 0.1 K/uL   Smear Review Criteria for review not met    Ct Pelvis Wo Contrast  Result Date: 03/03/2018 CLINICAL DATA:  Patient bent over getting something under bed fell today around 5pm, fell backwards Bilateral hip painNo surgeryNo cancerNo numbness or tingling EXAM: CT PELVIS WITHOUT CONTRAST TECHNIQUE: Multidetector CT imaging of the pelvis was performed following the standard protocol without intravenous contrast. COMPARISON:  CT, 07/17/2014 FINDINGS: Musculoskeletal: No fracture or bone lesion. There is mild subchondral cystic change along the anterior superior acetabula. Minimal osteophytes project from the base of the right femoral head. No other degenerative change. The SI joints are normally spaced and aligned as is the symphysis pubis. Skeletal structures are demineralized. Urinary Tract: 2 small stones in the visualized lower pole the right kidney. Ureters normal course and in caliber. Bladder unremarkable. Bowel: No bowel wall thickening or inflammation. No bowel dilation. Vascular/Lymphatic: Distal aorta and iliac artery atherosclerotic calcifications. Reproductive:  No mass or other significant abnormality Other: There are enlarged right inguinal lymph nodes, largest measuring 16 mm in short axis. These are similar to the prior CT. IMPRESSION: 1. No fracture or acute finding. 2. Enlarged right inguinal lymph nodes, stable when compared to the CT dated 07/17/2014. 3. Aortic atherosclerosis. Electronically Signed   By: Lajean Manes M.D.   On: 03/03/2018 19:07   Initial Impression / Assessment and Plan / ED Course  I have reviewed the  triage vital signs and the nursing notes. CT scan reviewed by me Pertinent labs & imaging results that were available during my care of the patient were reviewed by me and considered in my medical decision making (see chart for details).     Cervical spine cleared by Nexus criteria.  Tylenol for pain.  I  suggest that she use a cane to prevent falls. Blood pressure 7:40 PM 142/65.  Follow-up with PMD as needed Final Clinical Impressions(s) / ED Diagnoses  Dx #1 fall Final diagnoses:  None  #2 contusion to pelvis  ED Discharge Orders    None       Orlie Dakin, MD 03/03/18 1946    Orlie Dakin, MD 03/03/18 1947

## 2018-03-11 ENCOUNTER — Encounter (HOSPITAL_COMMUNITY): Payer: Self-pay

## 2018-03-11 ENCOUNTER — Inpatient Hospital Stay (HOSPITAL_COMMUNITY)
Admission: EM | Admit: 2018-03-11 | Discharge: 2018-03-16 | DRG: 640 | Disposition: A | Discharge status: Skilled Nursing Facility | Payer: Medicare Other | Attending: Internal Medicine | Admitting: Internal Medicine

## 2018-03-11 ENCOUNTER — Other Ambulatory Visit: Payer: Self-pay

## 2018-03-11 ENCOUNTER — Emergency Department (HOSPITAL_COMMUNITY): Payer: Medicare Other

## 2018-03-11 DIAGNOSIS — E8809 Other disorders of plasma-protein metabolism, not elsewhere classified: Secondary | ICD-10-CM | POA: Diagnosis present

## 2018-03-11 DIAGNOSIS — M25551 Pain in right hip: Secondary | ICD-10-CM | POA: Diagnosis present

## 2018-03-11 DIAGNOSIS — T502X5A Adverse effect of carbonic-anhydrase inhibitors, benzothiadiazides and other diuretics, initial encounter: Secondary | ICD-10-CM | POA: Diagnosis present

## 2018-03-11 DIAGNOSIS — B964 Proteus (mirabilis) (morganii) as the cause of diseases classified elsewhere: Secondary | ICD-10-CM | POA: Diagnosis present

## 2018-03-11 DIAGNOSIS — I10 Essential (primary) hypertension: Secondary | ICD-10-CM | POA: Diagnosis present

## 2018-03-11 DIAGNOSIS — K219 Gastro-esophageal reflux disease without esophagitis: Secondary | ICD-10-CM | POA: Diagnosis present

## 2018-03-11 DIAGNOSIS — I160 Hypertensive urgency: Secondary | ICD-10-CM | POA: Diagnosis present

## 2018-03-11 DIAGNOSIS — M25552 Pain in left hip: Secondary | ICD-10-CM | POA: Diagnosis present

## 2018-03-11 DIAGNOSIS — Z66 Do not resuscitate: Secondary | ICD-10-CM | POA: Diagnosis present

## 2018-03-11 DIAGNOSIS — Y92009 Unspecified place in unspecified non-institutional (private) residence as the place of occurrence of the external cause: Secondary | ICD-10-CM | POA: Diagnosis not present

## 2018-03-11 DIAGNOSIS — G92 Toxic encephalopathy: Secondary | ICD-10-CM | POA: Diagnosis present

## 2018-03-11 DIAGNOSIS — R5381 Other malaise: Secondary | ICD-10-CM | POA: Diagnosis present

## 2018-03-11 DIAGNOSIS — N179 Acute kidney failure, unspecified: Secondary | ICD-10-CM | POA: Diagnosis not present

## 2018-03-11 DIAGNOSIS — Z9181 History of falling: Secondary | ICD-10-CM | POA: Diagnosis not present

## 2018-03-11 DIAGNOSIS — N390 Urinary tract infection, site not specified: Secondary | ICD-10-CM

## 2018-03-11 DIAGNOSIS — J449 Chronic obstructive pulmonary disease, unspecified: Secondary | ICD-10-CM | POA: Diagnosis present

## 2018-03-11 DIAGNOSIS — G9341 Metabolic encephalopathy: Secondary | ICD-10-CM | POA: Diagnosis not present

## 2018-03-11 DIAGNOSIS — N170 Acute kidney failure with tubular necrosis: Secondary | ICD-10-CM | POA: Diagnosis present

## 2018-03-11 DIAGNOSIS — E860 Dehydration: Secondary | ICD-10-CM | POA: Diagnosis present

## 2018-03-11 DIAGNOSIS — X58XXXA Exposure to other specified factors, initial encounter: Secondary | ICD-10-CM | POA: Diagnosis present

## 2018-03-11 DIAGNOSIS — Z791 Long term (current) use of non-steroidal anti-inflammatories (NSAID): Secondary | ICD-10-CM

## 2018-03-11 DIAGNOSIS — E869 Volume depletion, unspecified: Secondary | ICD-10-CM | POA: Diagnosis not present

## 2018-03-11 DIAGNOSIS — E876 Hypokalemia: Secondary | ICD-10-CM | POA: Diagnosis present

## 2018-03-11 DIAGNOSIS — W19XXXD Unspecified fall, subsequent encounter: Secondary | ICD-10-CM

## 2018-03-11 DIAGNOSIS — Z79899 Other long term (current) drug therapy: Secondary | ICD-10-CM

## 2018-03-11 DIAGNOSIS — N3000 Acute cystitis without hematuria: Secondary | ICD-10-CM | POA: Diagnosis not present

## 2018-03-11 DIAGNOSIS — R4182 Altered mental status, unspecified: Secondary | ICD-10-CM

## 2018-03-11 DIAGNOSIS — M545 Low back pain: Secondary | ICD-10-CM | POA: Diagnosis present

## 2018-03-11 DIAGNOSIS — W19XXXA Unspecified fall, initial encounter: Secondary | ICD-10-CM

## 2018-03-11 DIAGNOSIS — Z8249 Family history of ischemic heart disease and other diseases of the circulatory system: Secondary | ICD-10-CM

## 2018-03-11 DIAGNOSIS — H3530 Unspecified macular degeneration: Secondary | ICD-10-CM | POA: Diagnosis present

## 2018-03-11 DIAGNOSIS — R0602 Shortness of breath: Secondary | ICD-10-CM

## 2018-03-11 LAB — COMPREHENSIVE METABOLIC PANEL
ALT: 36 U/L (ref 0–44)
AST: 53 U/L — ABNORMAL HIGH (ref 15–41)
Albumin: 3.6 g/dL (ref 3.5–5.0)
Alkaline Phosphatase: 61 U/L (ref 38–126)
Anion gap: 10 (ref 5–15)
BUN: 33 mg/dL — ABNORMAL HIGH (ref 8–23)
CHLORIDE: 96 mmol/L — AB (ref 98–111)
CO2: 31 mmol/L (ref 22–32)
CREATININE: 1.98 mg/dL — AB (ref 0.44–1.00)
Calcium: 14.1 mg/dL (ref 8.9–10.3)
GFR, EST AFRICAN AMERICAN: 26 mL/min — AB (ref >60)
GFR, EST NON AFRICAN AMERICAN: 22 mL/min — AB (ref >60)
Glucose, Bld: 113 mg/dL — ABNORMAL HIGH (ref 70–99)
Potassium: 3.2 mmol/L — ABNORMAL LOW (ref 3.5–5.1)
Sodium: 137 mmol/L (ref 135–145)
Total Bilirubin: 0.8 mg/dL (ref 0.3–1.2)
Total Protein: 7.5 g/dL (ref 6.5–8.1)

## 2018-03-11 LAB — CBC
HCT: 35.3 % — ABNORMAL LOW (ref 36.0–46.0)
Hemoglobin: 12 g/dL (ref 12.0–15.0)
MCH: 31.8 pg (ref 26.0–34.0)
MCHC: 34 g/dL (ref 30.0–36.0)
MCV: 93.6 fL (ref 80.0–100.0)
Platelets: 184 10*3/uL (ref 150–400)
RBC: 3.77 MIL/uL — ABNORMAL LOW (ref 3.87–5.11)
RDW: 13 % (ref 11.5–15.5)
WBC: 5.5 10*3/uL (ref 4.0–10.5)
nRBC: 0 % (ref 0.0–0.2)

## 2018-03-11 LAB — CREATININE, SERUM
Creatinine, Ser: 1.88 mg/dL — ABNORMAL HIGH (ref 0.44–1.00)
GFR calc Af Amer: 27 mL/min — ABNORMAL LOW (ref >60)
GFR calc non Af Amer: 23 mL/min — ABNORMAL LOW (ref >60)

## 2018-03-11 LAB — BASIC METABOLIC PANEL
Anion gap: 9 (ref 5–15)
BUN: 31 mg/dL — ABNORMAL HIGH (ref 8–23)
CO2: 27 mmol/L (ref 22–32)
Calcium: 12.9 mg/dL — ABNORMAL HIGH (ref 8.9–10.3)
Chloride: 98 mmol/L (ref 98–111)
Creatinine, Ser: 1.84 mg/dL — ABNORMAL HIGH (ref 0.44–1.00)
GFR calc Af Amer: 28 mL/min — ABNORMAL LOW (ref >60)
GFR calc non Af Amer: 24 mL/min — ABNORMAL LOW (ref >60)
Glucose, Bld: 116 mg/dL — ABNORMAL HIGH (ref 70–99)
Potassium: 2.9 mmol/L — ABNORMAL LOW (ref 3.5–5.1)
Sodium: 134 mmol/L — ABNORMAL LOW (ref 135–145)

## 2018-03-11 LAB — URINALYSIS, ROUTINE W REFLEX MICROSCOPIC
Bilirubin Urine: NEGATIVE
Glucose, UA: NEGATIVE mg/dL
Hgb urine dipstick: NEGATIVE
Ketones, ur: NEGATIVE mg/dL
Nitrite: NEGATIVE
PROTEIN: NEGATIVE mg/dL
Specific Gravity, Urine: 1.01 (ref 1.005–1.030)
pH: 7 (ref 5.0–8.0)

## 2018-03-11 LAB — CBC WITH DIFFERENTIAL/PLATELET
Abs Immature Granulocytes: 0.01 10*3/uL (ref 0.00–0.07)
BASOS PCT: 1 %
Basophils Absolute: 0.1 10*3/uL (ref 0.0–0.1)
EOS ABS: 0.2 10*3/uL (ref 0.0–0.5)
Eosinophils Relative: 4 %
HCT: 35.3 % — ABNORMAL LOW (ref 36.0–46.0)
Hemoglobin: 11.7 g/dL — ABNORMAL LOW (ref 12.0–15.0)
Immature Granulocytes: 0 %
Lymphocytes Relative: 24 %
Lymphs Abs: 1.2 10*3/uL (ref 0.7–4.0)
MCH: 31.5 pg (ref 26.0–34.0)
MCHC: 33.1 g/dL (ref 30.0–36.0)
MCV: 95.1 fL (ref 80.0–100.0)
Monocytes Absolute: 0.6 10*3/uL (ref 0.1–1.0)
Monocytes Relative: 11 %
Neutro Abs: 2.9 10*3/uL (ref 1.7–7.7)
Neutrophils Relative %: 60 %
PLATELETS: 186 10*3/uL (ref 150–400)
RBC: 3.71 MIL/uL — ABNORMAL LOW (ref 3.87–5.11)
RDW: 13.1 % (ref 11.5–15.5)
WBC: 4.9 10*3/uL (ref 4.0–10.5)
nRBC: 0 % (ref 0.0–0.2)

## 2018-03-11 LAB — PHOSPHORUS: PHOSPHORUS: 3.1 mg/dL (ref 2.5–4.6)

## 2018-03-11 LAB — MAGNESIUM: Magnesium: 1.9 mg/dL (ref 1.7–2.4)

## 2018-03-11 MED ORDER — FAMCICLOVIR 500 MG PO TABS
500.0000 mg | ORAL_TABLET | Freq: Every day | ORAL | Status: DC
  Administered 2018-03-12: 500 mg via ORAL
  Filled 2018-03-11 (×2): qty 1

## 2018-03-11 MED ORDER — SODIUM CHLORIDE 0.9 % IV SOLN
60.0000 mg | Freq: Once | INTRAVENOUS | Status: AC
  Administered 2018-03-12: 60 mg via INTRAVENOUS
  Filled 2018-03-11: qty 20

## 2018-03-11 MED ORDER — LIDOCAINE 5 % EX PTCH
1.0000 | MEDICATED_PATCH | CUTANEOUS | Status: DC
  Administered 2018-03-11 – 2018-03-16 (×5): 1 via TRANSDERMAL
  Filled 2018-03-11 (×7): qty 1

## 2018-03-11 MED ORDER — KETOROLAC TROMETHAMINE 15 MG/ML IJ SOLN: 15.0000 mg | Freq: Three times a day (TID) | INTRAMUSCULAR | Status: DC | PRN

## 2018-03-11 MED ORDER — SODIUM CHLORIDE 0.9 % IV BOLUS
1000.0000 mL | Freq: Once | INTRAVENOUS | Status: AC
  Administered 2018-03-11: 1000 mL via INTRAVENOUS

## 2018-03-11 MED ORDER — FENTANYL CITRATE (PF) 100 MCG/2ML IJ SOLN
12.5000 ug | Freq: Four times a day (QID) | INTRAMUSCULAR | Status: DC | PRN
  Administered 2018-03-12: 12.5 ug via INTRAVENOUS
  Filled 2018-03-11: qty 2

## 2018-03-11 MED ORDER — CALCITONIN (SALMON) 200 UNIT/ML IJ SOLN
4.0000 [IU]/kg | Freq: Once | INTRAMUSCULAR | Status: AC
  Administered 2018-03-12: 226 [IU] via SUBCUTANEOUS
  Filled 2018-03-11: qty 1.13

## 2018-03-11 MED ORDER — BISACODYL 5 MG PO TBEC
5.0000 mg | DELAYED_RELEASE_TABLET | Freq: Once | ORAL | Status: AC
  Administered 2018-03-11: 5 mg via ORAL
  Filled 2018-03-11: qty 1

## 2018-03-11 MED ORDER — ACETAMINOPHEN 325 MG PO TABS: 650.0000 mg | ORAL_TABLET | Freq: Every day | ORAL | Status: DC | PRN

## 2018-03-11 MED ORDER — AMLODIPINE BESYLATE 5 MG PO TABS
5.0000 mg | ORAL_TABLET | Freq: Every day | ORAL | Status: DC
  Administered 2018-03-11 – 2018-03-14 (×4): 5 mg via ORAL
  Filled 2018-03-11 (×4): qty 1

## 2018-03-11 MED ORDER — PREDNISOLONE ACETATE 1 % OP SUSP
1.0000 [drp] | OPHTHALMIC | Status: DC
  Administered 2018-03-12 – 2018-03-16 (×3): 1 [drp] via OPHTHALMIC
  Filled 2018-03-11: qty 5

## 2018-03-11 MED ORDER — SODIUM CHLORIDE 0.9 % IV SOLN
INTRAVENOUS | Status: DC
  Administered 2018-03-11 – 2018-03-14 (×6): via INTRAVENOUS

## 2018-03-11 MED ORDER — HYDRALAZINE HCL 20 MG/ML IJ SOLN
10.0000 mg | Freq: Four times a day (QID) | INTRAMUSCULAR | Status: DC | PRN
  Administered 2018-03-12 (×2): 10 mg via INTRAVENOUS
  Filled 2018-03-11 (×2): qty 1

## 2018-03-11 MED ORDER — SODIUM CHLORIDE 0.9 % IV SOLN
1.0000 g | INTRAVENOUS | Status: AC
  Administered 2018-03-11 – 2018-03-15 (×5): 1 g via INTRAVENOUS
  Filled 2018-03-11 (×5): qty 10

## 2018-03-11 MED ORDER — POTASSIUM CHLORIDE CRYS ER 20 MEQ PO TBCR
40.0000 meq | EXTENDED_RELEASE_TABLET | Freq: Once | ORAL | Status: AC
  Administered 2018-03-11: 40 meq via ORAL
  Filled 2018-03-11: qty 2

## 2018-03-11 MED ORDER — ENOXAPARIN SODIUM 30 MG/0.3ML ~~LOC~~ SOLN
30.0000 mg | SUBCUTANEOUS | Status: DC
  Administered 2018-03-11 – 2018-03-15 (×5): 30 mg via SUBCUTANEOUS
  Filled 2018-03-11 (×6): qty 0.3

## 2018-03-11 MED ORDER — HYDROCHLOROTHIAZIDE 25 MG PO TABS: 25.0000 mg | ORAL_TABLET | Freq: Every day | ORAL | Status: DC

## 2018-03-11 MED ORDER — RISAQUAD PO CAPS
ORAL_CAPSULE | Freq: Every evening | ORAL | Status: DC
  Administered 2018-03-12 – 2018-03-16 (×5): 1 via ORAL
  Filled 2018-03-11 (×7): qty 1

## 2018-03-11 MED ORDER — TRAMADOL HCL 50 MG PO TABS
50.0000 mg | ORAL_TABLET | Freq: Two times a day (BID) | ORAL | Status: DC | PRN
  Administered 2018-03-11 – 2018-03-12 (×2): 50 mg via ORAL
  Filled 2018-03-11 (×2): qty 1

## 2018-03-11 MED ORDER — VITAMIN B-12 1000 MCG PO TABS
1000.0000 ug | ORAL_TABLET | Freq: Every day | ORAL | Status: DC
  Administered 2018-03-12 – 2018-03-16 (×5): 1000 ug via ORAL
  Filled 2018-03-11 (×5): qty 1

## 2018-03-11 MED ORDER — POLYVINYL ALCOHOL 1.4 % OP SOLN
1.0000 [drp] | OPHTHALMIC | Status: DC | PRN
  Filled 2018-03-11 (×2): qty 15

## 2018-03-11 MED ORDER — HYDRALAZINE HCL 20 MG/ML IJ SOLN
10.0000 mg | Freq: Four times a day (QID) | INTRAMUSCULAR | Status: DC | PRN
  Administered 2018-03-11: 10 mg via INTRAVENOUS
  Filled 2018-03-11: qty 1

## 2018-03-11 NOTE — Consult Note (Signed)
[facility] Renal Consultation Note  Requesting MD: Dr. Lajuana Ripple Indication for Consultation:  hypercalcemia  Chief complaint: confusion, recent fall  HPI:  Anne Bradshaw is a 85 y.o. female with a history of hypertension who presented to the hospital with altered mental status and decreased p.o. intake.  Note that she fell within the past week and has been evaluated by her PCP as well as [facility] for the same.  She had a CT pelvis which demonstrated no fracture as a result of the fall.  CT this evening of the cervical spine is concerning for small lucent lesions which were felt possibly secondary to osteopenia or myeloma.  The patient has no known history of heart failure.  She has been awake and alert in the ER.  She was initiated on normal saline 150 ml per hour.  Her daughter accompanies her this evening.  She is getting the answers to orienting questions correct however is having to think about these answers a little bit more than normal.  They describe her mental status as normally "sharp as a tack."  Patient states that she has been on daily ibuprofen for over a week due to back pain.  She reports that she has had some nausea earlier today and constipation a couple days ago and some mild abdominal pain earlier today.  She denies any issues with her breathing.  She has been taking vitamin D 2000 units daily; this was increased from 1000 units daily after outpatient labs per the patient.  She denies any use of Tums or other antacids.   Creatinine, Ser  Date/Time Value Ref Range Status  03/11/2018 07:12 PM 1.88 (H) 0.44 - 1.00 mg/dL Final  11/57/2620 35:59 PM 1.98 (H) 0.44 - 1.00 mg/dL Final  74/16/3845 36:46 PM 0.80 0.44 - 1.00 mg/dL Final     PMHx:   Past Medical History:  Diagnosis Date  . Asthma    as child  . GERD (gastroesophageal reflux disease)   . High blood pressure   . Iritis   . Reflux     Past Surgical History:  Procedure Laterality Date  . LYMPH  NODE BIOPSY Right 07/29/2014   Procedure: RIGHT INGUINAL LYMPH NODE BIOPSY;  Surgeon: [name], MD;  Location: [facility];  Service: General;  Laterality: Right;  . NO PAST SURGERIES    . SKIN BIOPSY Right 07/29/2014   Procedure: SKIN PUNCH BIOPSY;  Surgeon: [name], MD;  Location: [facility];  Service: General;  Laterality: Right;    Family Hx:  Family History  Problem Relation Age of Onset  . Congestive Heart Failure Father   . Congestive Heart Failure Brother   . Congestive Heart Failure Paternal Grandmother     Social History:  reports that she has never smoked. She has never used smokeless tobacco. She reports that she does not drink alcohol or use drugs.  Allergies: No Known Allergies  Medications: Prior to Admission medications   Medication Sig Start Date End Date Taking? Authorizing Provider  acetaminophen (TYLENOL) 325 MG tablet Take 650 mg by mouth daily as needed for mild pain.   Yes [provider]  famciclovir (FAMVIR) 500 MG tablet Take 500 mg by mouth daily.    Yes [provider]  hydrochlorothiazide (HYDRODIURIL) 25 MG tablet Take 25 mg by mouth daily.   Yes [provider]  ibuprofen (ADVIL,MOTRIN) 200 MG tablet Take 200 mg by mouth daily as needed for mild pain.  Yes [provider]  Polyethyl Glycol-Propyl Glycol (SYSTANE) 0.4-0.3 % SOLN Apply to eye 4 (four) times daily as needed.   Yes [provider]  prednisoLONE acetate (PRED FORTE) 1 % ophthalmic suspension Place 1 drop into both eyes every other day.    Yes [provider]  Probiotic Product (PRO-BIOTIC BLEND PO) Take by mouth every evening.   Yes [provider]  vitamin B-12 (CYANOCOBALAMIN) 1000 MCG tablet Take 1,000 mcg by mouth daily.   Yes [provider]    I have reviewed the patient's current medications.  Labs:  BMP Latest Ref Rng & Units 03/11/2018 03/11/2018 07/29/2014  Glucose 70 - 99 mg/dL  - 801(K) 553(Z)  BUN 8 - 23 mg/dL - 48(O) 15  Creatinine 0.44 - 1.00 mg/dL 7.07(E) 6.75(Q) 4.92  Sodium 135 - 145 mmol/L - 137 140  Potassium 3.5 - 5.1 mmol/L - 3.2(L) 3.8  Chloride 98 - 111 mmol/L - 96(L) 102  CO2 22 - 32 mmol/L - 31 -  Calcium 8.9 - 10.3 mg/dL - 14.1(HH) -    Urinalysis    Component Value Date/Time   COLORURINE YELLOW 03/11/2018 1426   APPEARANCEUR CLOUDY (A) 03/11/2018 1426   LABSPEC 1.010 03/11/2018 1426   PHURINE 7.0 03/11/2018 1426   GLUCOSEU NEGATIVE 03/11/2018 1426   HGBUR NEGATIVE 03/11/2018 1426   BILIRUBINUR NEGATIVE 03/11/2018 1426   KETONESUR NEGATIVE 03/11/2018 1426   PROTEINUR NEGATIVE 03/11/2018 1426   NITRITE NEGATIVE 03/11/2018 1426   LEUKOCYTESUR MODERATE (A) 03/11/2018 1426     ROS:  Pertinent items noted in HPI and remainder of comprehensive ROS otherwise negative.   Physical Exam: Vitals:   03/11/18 1811 03/11/18 1936  BP: (!) 209/89 (!) 192/82  Pulse: 71 83  Resp: 16 20  Temp: 97.8 F (36.6 C) 97.7 F (36.5 C)  SpO2: 96% 98%     General: Elderly female in bed in no acute distress at rest HEENT: Normocephalic atraumatic  Eyes: External ocular movements intact sclera anicteric Neck: Supple no JVD trachea midline Heart: Regular rate and rhythm no murmurs rubs or gallops appreciated Lungs: Clear to auscultation bilaterally and respirations unlabored Abdomen: Thin soft nontender nondistended normal bowel sounds Extremities: No edema appreciated Skin: No cyanosis or clubbing.  No rash on extremities exposed Neuro: Alert and oriented x3 provides a history no focal deficits on gross exam; examined while supine Psych: Normal mood and affect, calm  Assessment/Plan:  # Hypercalcemia - Degree of elevation is concerning for myeloma or occult malignancy - Continue hydration as tolerated by respiratory status - We will administer calcitonin once now  - Repeat BMP now - Pamidronate IV once now - Noted SPEP is ordered.  We will also  send UPEP and kappa/lambda serum free light chains - Hold HCTZ and vitamin D  - Check vitamin D and intact PTH  # Acute kidney injury  - Secondary to prerenal insults in the setting of decreased p.o. intake as well as hypercalcemia. - Hydration as above and management of hypercalcemia as above - Avoid NSAID's  # Altered mental status - Variable, alert and oriented on my exam yet not at baseline per family.  Secondary to hypercalcemia  # Cervical spine lesions  - Obtaining SPEP, UPEP, and free light chains - Would obtain thoracic and lumbar spine images - see these are ordered  # HTN  - Assessed as uncontrolled  - Would not resume home HCTZ  - Agree with amlodipine and note hydralazine PRN  # s/p  mechanical fall  - Setting of hypercalcemia, AMS - Fall risk precautions   Thank you for the consult.  Please do not hesitate to contact me with any questions regarding our patient  Estanislado EmmsLori C Foster 03/11/2018, 7:46 PM

## 2018-03-11 NOTE — ED Provider Notes (Signed)
Medical screening examination/treatment/procedure(s) were conducted as a shared visit with non-physician practitioner(s) and myself.  I personally evaluated the patient during the encounter.  EKG Interpretation  Date/Time:  Sunday March 11 2018 12:35:26 EST Ventricular Rate:  67 PR Interval:    QRS Duration: 95 QT Interval:  378 QTC Calculation: 399 R Axis:   -47 Text Interpretation:  Sinus rhythm LAD, consider left anterior fascicular block RSR' in V1 or V2, probably normal variant Baseline wander in lead(s) I II aVR Confirmed by Lorre NickAllen, Carlus Stay ([id number]) on 03/11/2018 2:1915:[id number] PM 85 year old female here with altered mental status which is waxing and waning since a fall back in January 11.  Has been complaining of hip pain.  Labs here show hypercalcemia as well as acute kidney injury.  Will be admitted to the hospitalist service   Lorre NickAllen, Jcion Buddenhagen, MD 03/11/18 1420

## 2018-03-11 NOTE — Progress Notes (Signed)
Pt has severe headache. BP is 173/72, P 69. Too soon to give Hydralazine. Gave Tramadol for pain and messaged MD about BP.   Larey Days, RN

## 2018-03-11 NOTE — ED Triage Notes (Signed)
Pt brought in by [facility] from home for further evaluation following a fall on January 11th. Per EMS pt was seen at [facility] with c/o hip pain, per EMS she only had imaging performed of her hip. Family states pt has had an increase in confusion and restlesness after discharge. Pt has hx of dementia. Per EMS family reports pt did not hit her head when she fell, denies LOC.

## 2018-03-11 NOTE — ED Notes (Signed)
Patient transported to CT 

## 2018-03-11 NOTE — ED Notes (Signed)
Patient transported to X-ray 

## 2018-03-11 NOTE — Progress Notes (Signed)
Pt keeps asking for a laxative and having daughter ask. PT had a BM yesterday. States she gets impacted easily and cannot go without having one almost daily. Messaged MD.  Larey Dayshristy M Geetika Laborde, RN

## 2018-03-11 NOTE — Progress Notes (Signed)
Patient arrived from the ED on stretcher, pt alert and oriented 2-3.  She is in low bed for fall.  Floor mats placed.  Right AC NS bolus running.  Cardiac monitoring is on 22M 11, fall precautions taken care of, placed call light and phone within reach, will continue to monitor.

## 2018-03-11 NOTE — ED Provider Notes (Signed)
[facility] EMERGENCY DEPARTMENT Provider Note   CSN: [id number] Arrival date & time: 03/11/18  1155     History   Chief Complaint Chief Complaint  Patient presents with  . Altered Mental Status    HPI Anne Bradshaw is a 85 y.o. female with a past medical history of hypertension, GERD, who presents today for evaluation of confusion and behavior changes.  She was seen after a reportedly mechanical fall on January 11 at [facility] long for hip pain.  She reports that since then she has had increasing pain in her hips.  She says that she knows something is wrong with her hips and wants imaging of them again.  She, according to family, is normally "sharp as a tack" and recently has been confused, worsening since Thursday.  She has reportedly been restless and anxious.  She has not had any cough.  She reported that she occasionally has pain when she pees.  Denies CP/ShOB.  No abdominal pain.   According to family she has been ambulatory with PT since her fall.  Chart review shows that when she was seen for her fall she had a CT pelvis without contrast performed showing no evidence of fractures or other acute abnormalities.  As she stated that she did not strike her head, CT head was not obtained at that time.   History obtained from patient, her husband, and daughter.    HPI  Past Medical History:  Diagnosis Date  . Asthma    as child  . GERD (gastroesophageal reflux disease)   . High blood pressure   . Iritis   . Reflux     Patient Active Problem List   Diagnosis Date Noted  . Essential hypertension 09/27/2014  . 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 LYMPH NODE BIOPSY Right 07/29/2014   Procedure: RIGHT INGUINAL LYMPH NODE BIOPSY;  Surgeon: Stark Klein, MD;  Location: [facility];  Service: General;  Laterality: Right;  . NO PAST SURGERIES    . SKIN BIOPSY Right 07/29/2014   Procedure:  SKIN PUNCH BIOPSY;  Surgeon: Stark Klein, MD;  Location: [place];  Service: General;  Laterality: Right;     OB History   No obstetric history on file.      Home Medications    Prior to Admission medications   Medication Sig Start Date End Date Taking? Authorizing Provider  famciclovir (FAMVIR) 500 MG tablet Take 500 mg by mouth daily.     [provider]  hydrochlorothiazide (HYDRODIURIL) 25 MG tablet Take 25 mg by mouth daily.    [provider]  Polyethyl Glycol-Propyl Glycol (SYSTANE) 0.4-0.3 % SOLN Apply to eye 4 (four) times daily as needed.    [provider]  prednisoLONE acetate (PRED FORTE) 1 % ophthalmic suspension Place 1 drop into both eyes every other day.     [provider]  Probiotic Product (PRO-BIOTIC BLEND PO) Take by mouth every evening.    [provider]  triamcinolone lotion (KENALOG) 0.1 % Apply 1 application topically 2 (two) times daily.    [provider]  vitamin B-12 (CYANOCOBALAMIN) 1000 MCG tablet Take 1,000 mcg by mouth daily.    [provider]    Family History Family History  Problem Relation Age of Onset  . Congestive Heart Failure Father   . Congestive Heart Failure Brother   . Congestive Heart Failure Paternal Grandmother  Social History Social History   Tobacco Use  . Smoking status: Never Smoker  . Smokeless tobacco: Never Used  Substance Use Topics  . Alcohol use: No    Alcohol/week: 0.0 standard drinks  . Drug use: No     Allergies   Patient has no known allergies.   Review of Systems Review of Systems  Constitutional: Positive for fatigue. Negative for chills and fever.  HENT: Negative for congestion.   Eyes: Negative for visual disturbance.  Respiratory: Negative for cough, chest tightness and shortness of breath.   Cardiovascular: Negative for chest pain.  Gastrointestinal: Negative for abdominal pain, diarrhea and nausea.    Genitourinary: Positive for dysuria. Negative for difficulty urinating and frequency.  Musculoskeletal: Positive for neck pain. Negative for back pain.       Hip pain  Neurological: Positive for weakness (Generalized) and headaches.  Psychiatric/Behavioral: Positive for confusion. The patient is nervous/anxious.   All other systems reviewed and are negative.    Physical Exam Updated Vital Signs BP (!) 159/82   Pulse 70   Temp 98 F (36.7 C) (Oral)   Resp 16   Ht '5\' 4"'$  (1.626 m)   Wt 49.4 kg   SpO2 95%   BMI 18.69 kg/m   Physical Exam Vitals signs and nursing note reviewed.  Constitutional:      General: She is not in acute distress.    Appearance: She is well-developed and normal weight.  HENT:     Head: Normocephalic and atraumatic.     Right Ear: External ear normal. There is impacted cerumen.     Left Ear: Tympanic membrane, ear canal and external ear normal.     Nose: Nose normal.     Mouth/Throat:     Mouth: Mucous membranes are moist.  Eyes:     Conjunctiva/sclera: Conjunctivae normal.  Neck:     Musculoskeletal: Neck supple.  Cardiovascular:     Rate and Rhythm: Normal rate and regular rhythm.     Heart sounds: No murmur.  Pulmonary:     Effort: Pulmonary effort is normal. No respiratory distress.     Breath sounds: Normal breath sounds.  Abdominal:     General: There is no distension.     Palpations: Abdomen is soft.     Tenderness: There is no abdominal tenderness.  Musculoskeletal:     Right lower leg: No edema.     Left lower leg: No edema.     Comments: Upper C-spine midline TTP.  Remainder of C/T/L spine is nontender to palpation, no step-offs or deformities palpated.  There is tenderness to palpation over bilateral buttocks, laterally.   Skin:    General: Skin is warm and dry.  Neurological:     General: No focal deficit present.     Mental Status: She is alert and oriented to person, place, and time.     Comments: Mental Status:  Alert,  oriented, thought content appropriate, able to give a coherent history. Speech fluent without evidence of aphasia. Able to follow 2 step commands without difficulty.  Cranial Nerves:  II:  pupils equal, round, reactive to light III,IV, VI: ptosis not present, extra-ocular motions intact bilaterally  V,VII: smile symmetric, facial light touch sensation equal VIII: hearing grossly normal to voice  X: uvula elevates symmetrically  XI: bilateral shoulder shrug symmetric and strong XII: midline tongue extension without fassiculations Motor:  Normal tone. 5/5 in upper and lower extremities bilaterally including strong and equal grip strength and dorsiflexion/plantar flexion  CV: distal pulses palpable throughout   Psychiatric:        Mood and Affect: Mood normal.      ED Treatments / Results  Labs (all labs ordered are listed, but only abnormal results are displayed) Labs Reviewed  CBC WITH DIFFERENTIAL/PLATELET - Abnormal; Notable for the following components:      Result Value   RBC 3.71 (*)    Hemoglobin 11.7 (*)    HCT 35.3 (*)    All other components within normal limits  COMPREHENSIVE METABOLIC PANEL - Abnormal; Notable for the following components:   Potassium 3.2 (*)    Chloride 96 (*)    Glucose, Bld 113 (*)    BUN 33 (*)    Creatinine, Ser 1.98 (*)    Calcium 14.1 (*)    AST 53 (*)    GFR calc non Af Amer 22 (*)    GFR calc Af Amer 26 (*)    All other components within normal limits  URINALYSIS, ROUTINE W REFLEX MICROSCOPIC - Abnormal; Notable for the following components:   APPearance CLOUDY (*)    Leukocytes, UA MODERATE (*)    Bacteria, UA RARE (*)    Non Squamous Epithelial 0-5 (*)    All other components within normal limits  URINE CULTURE  MAGNESIUM  PHOSPHORUS  CALCIUM, IONIZED  CBC  CREATININE, SERUM  COMPREHENSIVE METABOLIC PANEL    EKG EKG Interpretation  Date/Time:  Sunday March 11 2018 12:35:26 EST Ventricular Rate:  67 PR Interval:    QRS  Duration: 95 QT Interval:  378 QTC Calculation: 399 R Axis:   -47 Text Interpretation:  Sinus rhythm LAD, consider left anterior fascicular block RSR' in V1 or V2, probably normal variant Baseline wander in lead(s) I II aVR Confirmed by Lacretia Leigh ([id number]) on 03/11/2018 2:15:51 PM   Radiology Dg Chest 2 View  Result Date: 03/11/2018 CLINICAL DATA:  Recent fall, initial encounter EXAM: CHEST - 2 VIEW COMPARISON:  12/11/2014 FINDINGS: Cardiac shadows within normal limits. Aortic calcifications are noted. Lungs are hyperinflated without focal infiltrate or sizable effusion. No acute bony abnormality is noted. IMPRESSION: No acute abnormality noted. Electronically Signed   By: Inez Catalina M.D.   On: 03/11/2018 15:18   Ct Head Wo Contrast  Result Date: 03/11/2018 CLINICAL DATA:  Altered level of consciousness, confusion EXAM: CT HEAD WITHOUT CONTRAST CT CERVICAL SPINE WITHOUT CONTRAST TECHNIQUE: Multidetector CT imaging of the head and cervical spine was performed following the standard protocol without intravenous contrast. Multiplanar CT image reconstructions of the cervical spine were also generated. COMPARISON:  None. FINDINGS: CT HEAD FINDINGS Brain: No evidence of acute infarction, hemorrhage, extra-axial collection, ventriculomegaly, or mass effect. Generalized cerebral atrophy. Periventricular white matter low attenuation likely secondary to microangiopathy. Vascular: Cerebrovascular atherosclerotic calcifications are noted. Skull: Negative for fracture or focal lesion. Sinuses/Orbits: Visualized portions of the orbits are unremarkable. Visualized portions of the paranasal sinuses and mastoid air cells are unremarkable. Other: None. CT CERVICAL SPINE FINDINGS Alignment: Normal. Skull base and vertebrae: No acute fracture. Multiple small lucent lesions in the cervical spine vertebral bodies and posterior elements . Soft tissues and spinal canal: No prevertebral fluid or swelling. No visible canal  hematoma. Disc levels: Degenerative disc disease with disc height loss at C4-5 and C5-6. Broad-based disc osteophyte complexes at C4-5 and C5-6 with bilateral uncovertebral degenerative changes. Moderate right facet arthropathy at C2-3. Moderate right and mild left facet arthropathy at C3-4. Moderate bilateral facet arthropathy at C4-5 with mild bilateral  foraminal narrowing. Mild bilateral facet arthropathy and bilateral uncovertebral degenerative changes at C5-6 with foraminal stenosis. Upper chest: Biapical fibrosis Other: No fluid collection or hematoma. IMPRESSION: 1. No acute intracranial pathology. 2.  No acute osseous injury of the cervical spine. 3. Cervical spine spondylosis as described above. 4. Multiple small lucent lesions in the cervical spine vertebral bodies and posterior elements which may be secondary to osteopenia, but a similar appearance can be seen in the setting of multiple myeloma. Correlate with laboratory values. Electronically Signed   By: Kathreen Devoid   On: 03/11/2018 13:40   Ct Cervical Spine Wo Contrast  Result Date: 03/11/2018 CLINICAL DATA:  Altered level of consciousness, confusion EXAM: CT HEAD WITHOUT CONTRAST CT CERVICAL SPINE WITHOUT CONTRAST TECHNIQUE: Multidetector CT imaging of the head and cervical spine was performed following the standard protocol without intravenous contrast. Multiplanar CT image reconstructions of the cervical spine were also generated. COMPARISON:  None. FINDINGS: CT HEAD FINDINGS Brain: No evidence of acute infarction, hemorrhage, extra-axial collection, ventriculomegaly, or mass effect. Generalized cerebral atrophy. Periventricular white matter low attenuation likely secondary to microangiopathy. Vascular: Cerebrovascular atherosclerotic calcifications are noted. Skull: Negative for fracture or focal lesion. Sinuses/Orbits: Visualized portions of the orbits are unremarkable. Visualized portions of the paranasal sinuses and mastoid air cells are  unremarkable. Other: None. CT CERVICAL SPINE FINDINGS Alignment: Normal. Skull base and vertebrae: No acute fracture. Multiple small lucent lesions in the cervical spine vertebral bodies and posterior elements . Soft tissues and spinal canal: No prevertebral fluid or swelling. No visible canal hematoma. Disc levels: Degenerative disc disease with disc height loss at C4-5 and C5-6. Broad-based disc osteophyte complexes at C4-5 and C5-6 with bilateral uncovertebral degenerative changes. Moderate right facet arthropathy at C2-3. Moderate right and mild left facet arthropathy at C3-4. Moderate bilateral facet arthropathy at C4-5 with mild bilateral foraminal narrowing. Mild bilateral facet arthropathy and bilateral uncovertebral degenerative changes at C5-6 with foraminal stenosis. Upper chest: Biapical fibrosis Other: No fluid collection or hematoma. IMPRESSION: 1. No acute intracranial pathology. 2.  No acute osseous injury of the cervical spine. 3. Cervical spine spondylosis as described above. 4. Multiple small lucent lesions in the cervical spine vertebral bodies and posterior elements which may be secondary to osteopenia, but a similar appearance can be seen in the setting of multiple myeloma. Correlate with laboratory values. Electronically Signed   By: Kathreen Devoid   On: 03/11/2018 13:40   Dg Pelvis Comp Min 3v  Result Date: 03/11/2018 CLINICAL DATA:  Recent fall with hip pain, initial encounter EXAM: [facility] PELVIS - 3+ VIEW COMPARISON:  03/03/2018 FINDINGS: Pelvic ring is intact. Postsurgical changes are noted in the region of right groin. No acute fracture or dislocation is noted. No soft tissue abnormality is seen. IMPRESSION: No acute abnormality noted. Electronically Signed   By: Inez Catalina M.D.   On: 03/11/2018 15:23    Procedures Procedures (including critical care time)  Medications Ordered in ED Medications  sodium chloride 0.9 % bolus 1,000 mL (has no administration in time range)    potassium chloride SA (K-DUR,KLOR-CON) CR tablet 40 mEq (has no administration in time range)     Initial Impression / Assessment and Plan / ED Course  I have reviewed the triage vital signs and the nursing notes.  Pertinent labs & imaging results that were available during my care of the patient were reviewed by me and considered in my medical decision making (see chart for details).  Clinical Course as of Jan  [place] Mar 11, 2018  1423 Pelvis and CXR ordered.   Calcium(!!): 14.1 [EH]  1424 AKI  Creatinine(!): 1.98 [EH]  58 Spoke with hospitalist who will admit patient.   [EH]  1602 Asked by admitting hospitalist to order a liter of fluids and 40 of potassium.  Orders were placed.   [EH]    Clinical Course User Index [EH] Lorin Glass, PA-C   Patient presents today with her family for evaluation of continued pain in her hips and worsening confusion.  She had a fall on the 11th and was seen then.  She had a CT scan of the pelvis which did not show anything acute.  She did not strike her head or pass out therefore CT head and neck was not obtained.  Given my concern for change in mental status CT head was obtained which did not show any acute abnormalities.  CT neck was obtained as she had some mild upper C-spine tenderness to palpation showing concern for multiple lucent lesions either osteopenia or multiple myeloma possibly.    Her CMP was significant for hypokalemia with a potassium of 3.2.  P.o. replacement was ordered.  Her creatinine is 1.98, and do not have recent labs to compare, however according to family her kidney function is normal.  GFR is 22.  Her urine had moderate leukocytes with rare bacteria, and she does report occasional dysuria.  Her white count is not elevated at 4.9.  Her hemoglobin is 11.7.  Chest x-ray and pelvis x-ray were obtained, both are without acute abnormalities.  Her calcium is significantly elevated at 14.1, this raises concern for a  underlying pathology, especially in the setting of lucent lesions concerning for multiple myeloma.  This patient was seen as a shared visit with Dr. Zenia Resides.  Patient and her family were informed of results.  She will be admitted by the hospitalist for further evaluation.   Final Clinical Impressions(s) / ED Diagnoses   Final diagnoses:  Hypercalcemia  AKI (acute kidney injury) ([place])  Altered mental status, unspecified altered mental status type  Fall, subsequent encounter    ED Discharge Orders    None       Ollen Gross 03/11/18 1719    Lacretia Leigh, MD 03/12/18 [phone]

## 2018-03-11 NOTE — ED Notes (Signed)
Attempted to call report x 1  

## 2018-03-11 NOTE — Progress Notes (Signed)
Transporter came to get pt for CT. Daughter stated that the MD who came and spoke at the bedside informed the patient and the daughter that the pt can wait til in the am to have CT done due to pt being exhausted and ready for bed. Transporter was informed to let CT know to come again in the am.   Larey Days, RN

## 2018-03-11 NOTE — H&P (Signed)
History and Physical    DOA: 03/11/2018  PCP: Mayra Neer, MD  Patient coming from: Home  Chief Complaint: Altered mental status  HPI: Anne Bradshaw is a 85 y.o. female with no significant past medical history except for GERD, hypertension (takes hydrochlorothiazide) and macular degeneration for which she takes eyedrops brought in by family due to concerns for altered mental status, fatigue and poor oral intake since she suffered a mechanical fall 8 days back.  Patient apparently bent down to pick up a paper, lost her balance and fell backwards onto her buttocks.  Husband who was in the den heard her fall and came to assist her.  There is no clear report of head injury.  Patient presented to [facility] long ED where she primarily complained of hip/buttock pain and underwent CT pelvis which was negative for any acute injuries.  She was discharged home with instructions to take Tylenol as needed.  She however continued to have severe muscle aches/pain along her buttocks going up to the mid back for which she sought help with her PCP on Tuesday.  Patient apparently was advised by PCP to take over-the-counter Advil as needed for pain and also home physical therapy was set up.  Patient was using a walker and ambulating with physical therapy but over the last couple of days she has had periods of confusion, variable oral intake and fatigue.  There is no report of fever or chills or abdominal pain or diarrhea or dysuria.  She slumped over when husband tried to help her in the bathroom yesterday and was sleeping most of the day. She was brought in for evaluation today and work-up in the ED revealed AKI, hypercalcemia and CT of C-spine showing areas of lucency indicating myeloma.  CT head was negative.  Her blood pressure has been elevated in the ED with systolic up to 585I.  She is requested to be admitted for further evaluation and management.   Review of Systems: As per HPI otherwise 10 point review of  systems negative.    Past Medical History:  Diagnosis Date  . Asthma    as child  . GERD (gastroesophageal reflux disease)   . High blood pressure   . Iritis   . Reflux     Past Surgical History:  Procedure Laterality Date  . LYMPH NODE BIOPSY Right 07/29/2014   Procedure: RIGHT INGUINAL LYMPH NODE BIOPSY;  Surgeon: Stark Klein, MD;  Location: [place];  Service: General;  Laterality: Right;  . NO PAST SURGERIES    . SKIN BIOPSY Right 07/29/2014   Procedure: SKIN PUNCH BIOPSY;  Surgeon: Stark Klein, MD;  Location: [place];  Service: General;  Laterality: Right;    Social history:  reports that she has never smoked. She has never used smokeless tobacco. She reports that she does not drink alcohol or use drugs.   No Known Allergies  Family History  Problem Relation Age of Onset  . Congestive Heart Failure Father   . Congestive Heart Failure Brother   . Congestive Heart Failure Paternal Grandmother       Prior to Admission medications   Medication Sig Start Date End Date Taking? Authorizing Provider  acetaminophen (TYLENOL) 325 MG tablet Take 650 mg by mouth daily as needed for mild pain.   Yes [provider]  famciclovir (FAMVIR) 500 MG tablet Take 500 mg by mouth daily.    Yes [provider]  hydrochlorothiazide (HYDRODIURIL) 25 MG tablet Take 25  mg by mouth daily.   Yes [provider]  ibuprofen (ADVIL,MOTRIN) 200 MG tablet Take 200 mg by mouth daily as needed for mild pain.   Yes [provider]  Polyethyl Glycol-Propyl Glycol (SYSTANE) 0.4-0.3 % SOLN Apply to eye 4 (four) times daily as needed.   Yes [provider]  prednisoLONE acetate (PRED FORTE) 1 % ophthalmic suspension Place 1 drop into both eyes every other day.    Yes [provider]  Probiotic Product (PRO-BIOTIC BLEND PO) Take by mouth every evening.   Yes [provider]  vitamin B-12 (CYANOCOBALAMIN) 1000 MCG  tablet Take 1,000 mcg by mouth daily.   Yes [provider]    Physical Exam: Vitals:   03/11/18 1515 03/11/18 1615 03/11/18 1630 03/11/18 1645  BP: (!) 174/60 (!) 189/75 (!) 175/83 (!) 174/78  Pulse: 67 68 73 71  Resp: (!) 21 20 (!) 25 15  Temp:      TempSrc:      SpO2: 96% 99% 98% 100%  Weight:      Height:        Constitutional: Frail-appearing elderly female laying flat on her back and in mild distress due to pain Eyes: PERRL, lids and conjunctivae normal ENMT: Mucous membranes are dry. Posterior pharynx clear of any exudate or lesions.Normal dentition.  Neck: normal, supple, no masses, no thyromegaly Respiratory: clear to auscultation bilaterally, no wheezing, no crackles. Normal respiratory effort. No accessory muscle use.  Cardiovascular: Regular rate and rhythm, no murmurs / rubs / gallops. No extremity edema. 2+ pedal pulses. No carotid bruits.  Abdomen: no tenderness, no masses palpated. No hepatosplenomegaly. Bowel sounds positive.  Musculoskeletal: Patient has mildly decreased range of motion along bilateral hip flexion due to pain in her lower back.  Reproducible pain along paraspinal area in the lower back. Neurologic: CN 2-12 grossly intact. Sensation intact, DTR normal. Strength 5/5 in all 4.  Psychiatric: Normal judgment and insight. Alert and oriented x 3. Normal mood.  SKIN/catheters: no rashes, lesions, ulcers. No induration  Labs on Admission: I have personally reviewed following labs and imaging studies  CBC: Recent Labs  Lab 03/11/18 1323  WBC 4.9  NEUTROABS 2.9  HGB 11.7*  HCT 35.3*  MCV 95.1  PLT 974   Basic Metabolic Panel: Recent Labs  Lab 03/11/18 1323  NA 137  K 3.2*  CL 96*  CO2 31  GLUCOSE 113*  BUN 33*  CREATININE 1.98*  CALCIUM 14.1*  MG 1.9  PHOS 3.1   GFR: Estimated Creatinine Clearance: 15.3 mL/min (A) (by C-G formula based on SCr of 1.98 mg/dL (H)). Liver Function Tests: Recent Labs  Lab 03/11/18 1323  AST 53*   ALT 36  ALKPHOS 61  BILITOT 0.8  PROT 7.5  ALBUMIN 3.6   No results for input(s): LIPASE, AMYLASE in the last 168 hours. No results for input(s): AMMONIA in the last 168 hours. Coagulation Profile: No results for input(s): INR, PROTIME in the last 168 hours. Cardiac Enzymes: No results for input(s): CKTOTAL, CKMB, CKMBINDEX, TROPONINI in the last 168 hours. BNP (last 3 results) No results for input(s): PROBNP in the last 8760 hours. HbA1C: No results for input(s): HGBA1C in the last 72 hours. CBG: No results for input(s): GLUCAP in the last 168 hours. Lipid Profile: No results for input(s): CHOL, HDL, LDLCALC, TRIG, CHOLHDL, LDLDIRECT in the last 72 hours. Thyroid Function Tests: No results for input(s): TSH, T4TOTAL, FREET4, T3FREE, THYROIDAB in the last 72 hours. Anemia Panel: No  results for input(s): VITAMINB12, FOLATE, FERRITIN, TIBC, IRON, RETICCTPCT in the last 72 hours. Urine analysis:    Component Value Date/Time   COLORURINE YELLOW 03/11/2018 1426   APPEARANCEUR CLOUDY (A) 03/11/2018 1426   LABSPEC 1.010 03/11/2018 1426   PHURINE 7.0 03/11/2018 1426   GLUCOSEU NEGATIVE 03/11/2018 1426   HGBUR NEGATIVE 03/11/2018 1426   BILIRUBINUR NEGATIVE 03/11/2018 1426   KETONESUR NEGATIVE 03/11/2018 1426   PROTEINUR NEGATIVE 03/11/2018 1426   NITRITE NEGATIVE 03/11/2018 1426   LEUKOCYTESUR MODERATE (A) 03/11/2018 1426    Radiological Exams on Admission: Dg Chest 2 View  Result Date: 03/11/2018 CLINICAL DATA:  Recent fall, initial encounter EXAM: CHEST - 2 VIEW COMPARISON:  12/11/2014 FINDINGS: Cardiac shadows within normal limits. Aortic calcifications are noted. Lungs are hyperinflated without focal infiltrate or sizable effusion. No acute bony abnormality is noted. IMPRESSION: No acute abnormality noted. Electronically Signed   By: Inez Catalina M.D.   On: 03/11/2018 15:18   Ct Head Wo Contrast  Result Date: 03/11/2018 CLINICAL DATA:  Altered level of consciousness,  confusion EXAM: CT HEAD WITHOUT CONTRAST CT CERVICAL SPINE WITHOUT CONTRAST TECHNIQUE: Multidetector CT imaging of the head and cervical spine was performed following the standard protocol without intravenous contrast. Multiplanar CT image reconstructions of the cervical spine were also generated. COMPARISON:  None. FINDINGS: CT HEAD FINDINGS Brain: No evidence of acute infarction, hemorrhage, extra-axial collection, ventriculomegaly, or mass effect. Generalized cerebral atrophy. Periventricular white matter low attenuation likely secondary to microangiopathy. Vascular: Cerebrovascular atherosclerotic calcifications are noted. Skull: Negative for fracture or focal lesion. Sinuses/Orbits: Visualized portions of the orbits are unremarkable. Visualized portions of the paranasal sinuses and mastoid air cells are unremarkable. Other: None. CT CERVICAL SPINE FINDINGS Alignment: Normal. Skull base and vertebrae: No acute fracture. Multiple small lucent lesions in the cervical spine vertebral bodies and posterior elements . Soft tissues and spinal canal: No prevertebral fluid or swelling. No visible canal hematoma. Disc levels: Degenerative disc disease with disc height loss at C4-5 and C5-6. Broad-based disc osteophyte complexes at C4-5 and C5-6 with bilateral uncovertebral degenerative changes. Moderate right facet arthropathy at C2-3. Moderate right and mild left facet arthropathy at C3-4. Moderate bilateral facet arthropathy at C4-5 with mild bilateral foraminal narrowing. Mild bilateral facet arthropathy and bilateral uncovertebral degenerative changes at C5-6 with foraminal stenosis. Upper chest: Biapical fibrosis Other: No fluid collection or hematoma. IMPRESSION: 1. No acute intracranial pathology. 2.  No acute osseous injury of the cervical spine. 3. Cervical spine spondylosis as described above. 4. Multiple small lucent lesions in the cervical spine vertebral bodies and posterior elements which may be secondary to  osteopenia, but a similar appearance can be seen in the setting of multiple myeloma. Correlate with laboratory values. Electronically Signed   By: Kathreen Devoid   On: 03/11/2018 13:40   Ct Cervical Spine Wo Contrast  Result Date: 03/11/2018 CLINICAL DATA:  Altered level of consciousness, confusion EXAM: CT HEAD WITHOUT CONTRAST CT CERVICAL SPINE WITHOUT CONTRAST TECHNIQUE: Multidetector CT imaging of the head and cervical spine was performed following the standard protocol without intravenous contrast. Multiplanar CT image reconstructions of the cervical spine were also generated. COMPARISON:  None. FINDINGS: CT HEAD FINDINGS Brain: No evidence of acute infarction, hemorrhage, extra-axial collection, ventriculomegaly, or mass effect. Generalized cerebral atrophy. Periventricular white matter low attenuation likely secondary to microangiopathy. Vascular: Cerebrovascular atherosclerotic calcifications are noted. Skull: Negative for fracture or focal lesion. Sinuses/Orbits: Visualized portions of the orbits are unremarkable. Visualized portions of the paranasal sinuses and  mastoid air cells are unremarkable. Other: None. CT CERVICAL SPINE FINDINGS Alignment: Normal. Skull base and vertebrae: No acute fracture. Multiple small lucent lesions in the cervical spine vertebral bodies and posterior elements . Soft tissues and spinal canal: No prevertebral fluid or swelling. No visible canal hematoma. Disc levels: Degenerative disc disease with disc height loss at C4-5 and C5-6. Broad-based disc osteophyte complexes at C4-5 and C5-6 with bilateral uncovertebral degenerative changes. Moderate right facet arthropathy at C2-3. Moderate right and mild left facet arthropathy at C3-4. Moderate bilateral facet arthropathy at C4-5 with mild bilateral foraminal narrowing. Mild bilateral facet arthropathy and bilateral uncovertebral degenerative changes at C5-6 with foraminal stenosis. Upper chest: Biapical fibrosis Other: No fluid  collection or hematoma. IMPRESSION: 1. No acute intracranial pathology. 2.  No acute osseous injury of the cervical spine. 3. Cervical spine spondylosis as described above. 4. Multiple small lucent lesions in the cervical spine vertebral bodies and posterior elements which may be secondary to osteopenia, but a similar appearance can be seen in the setting of multiple myeloma. Correlate with laboratory values. Electronically Signed   By: Kathreen Devoid   On: 03/11/2018 13:40   Dg Pelvis Comp Min 3v  Result Date: 03/11/2018 CLINICAL DATA:  Recent fall with hip pain, initial encounter EXAM: JUDET PELVIS - 3+ VIEW COMPARISON:  03/03/2018 FINDINGS: Pelvic ring is intact. Postsurgical changes are noted in the region of right groin. No acute fracture or dislocation is noted. No soft tissue abnormality is seen. IMPRESSION: No acute abnormality noted. Electronically Signed   By: Inez Catalina M.D.   On: 03/11/2018 15:23    EKG: Independently reviewed.  Normal sinus rhythm with QTC at 399 ms     Assessment and Plan:   1.  Hypercalcemia: Secondary to bone injury from recent fall versus bone malignancy versus thiazide use.  Admit with IV fluids at 150/h (unknown baseline EF but no history of CHF).  Hold hydrochlorothiazide.  Consulted nephrology who will evaluate patient.  Patient's albumin and total protein within normal limits.  However given back pain, hypercalcemia and renal insufficiency in the setting of abnormal CT findings, myeloma is in fact on the differential.  Will order ionized calcium level and serum protein electrophoresis.  Further work-up and treatment (calcitonin/zoledronate) as directed by nephrology  2.  Back pain: Patient does not have any complaints of neck pain but rather complains of low back pain.  CT pelvis unremarkable as described above.  Will obtain CT thoracolumbar spine  May need total body scan to rule out other areas of lucency/oncology evaluation.  3.?  UTI: Patient has pyuria,  bacteriuria and moderate leukocyte esterase on UA.  No evidence of fever or white count.  Will treat with empiric antibiotics and sent for urine cultures  4.  Hypertension: Hold HCTZ.  Added Norvasc and hydralazine as needed  5.  AKI: Hold diuretics.  Repeat labs after IV hydration.  6.  Hypokalemia: Replacement ordered  DVT prophylaxis: Lovenox  Code Status: DNR per patient's wish and confirmed by husband/daughter bedside  Family Communication: Discussed with patient and family bedside. Health care proxy would be husband and daughter Melissa Noon called: Nephrology Admission status:  Patient admitted as inpatient as anticipated LOS greater than 2 midnights    Guilford Shi MD [facility] Pager [phone]  If 7PM-7AM, please contact night-coverage [email or web address] Password [facility]  03/11/2018, 5:36 PM

## 2018-03-12 ENCOUNTER — Inpatient Hospital Stay (HOSPITAL_COMMUNITY): Payer: Medicare Other

## 2018-03-12 LAB — TROPONIN I

## 2018-03-12 LAB — COMPREHENSIVE METABOLIC PANEL
ALT: 33 U/L (ref 0–44)
AST: 47 U/L — ABNORMAL HIGH (ref 15–41)
Albumin: 3.2 g/dL — ABNORMAL LOW (ref 3.5–5.0)
Alkaline Phosphatase: 54 U/L (ref 38–126)
Anion gap: 11 (ref 5–15)
BUN: 29 mg/dL — ABNORMAL HIGH (ref 8–23)
CO2: 24 mmol/L (ref 22–32)
Calcium: 12.2 mg/dL — ABNORMAL HIGH (ref 8.9–10.3)
Chloride: 102 mmol/L (ref 98–111)
Creatinine, Ser: 1.82 mg/dL — ABNORMAL HIGH (ref 0.44–1.00)
GFR calc Af Amer: 28 mL/min — ABNORMAL LOW (ref >60)
GFR calc non Af Amer: 24 mL/min — ABNORMAL LOW (ref >60)
Glucose, Bld: 149 mg/dL — ABNORMAL HIGH (ref 70–99)
Potassium: 3.2 mmol/L — ABNORMAL LOW (ref 3.5–5.1)
Sodium: 137 mmol/L (ref 135–145)
Total Bilirubin: 0.7 mg/dL (ref 0.3–1.2)
Total Protein: 7.2 g/dL (ref 6.5–8.1)

## 2018-03-12 LAB — CALCIUM, IONIZED: Calcium, Ionized, Serum: 7.9 mg/dL — ABNORMAL HIGH (ref 4.5–5.6)

## 2018-03-12 MED ORDER — PANTOPRAZOLE SODIUM 40 MG IV SOLR
40.0000 mg | INTRAVENOUS | Status: DC
  Administered 2018-03-12 – 2018-03-16 (×5): 40 mg via INTRAVENOUS
  Filled 2018-03-12 (×5): qty 40

## 2018-03-12 MED ORDER — PROMETHAZINE HCL 25 MG/ML IJ SOLN
12.5000 mg | Freq: Once | INTRAMUSCULAR | Status: AC
  Administered 2018-03-12: 12.5 mg via INTRAVENOUS
  Filled 2018-03-12: qty 1

## 2018-03-12 MED ORDER — POTASSIUM CHLORIDE 10 MEQ/100ML IV SOLN
10.0000 meq | INTRAVENOUS | Status: AC
  Administered 2018-03-12: 10 meq via INTRAVENOUS
  Filled 2018-03-12: qty 100

## 2018-03-12 MED ORDER — POTASSIUM CHLORIDE 10 MEQ/100ML IV SOLN
10.0000 meq | Freq: Once | INTRAVENOUS | Status: AC
  Administered 2018-03-12: 10 meq via INTRAVENOUS
  Filled 2018-03-12: qty 100

## 2018-03-12 MED ORDER — ONDANSETRON HCL 4 MG/2ML IJ SOLN
4.0000 mg | Freq: Four times a day (QID) | INTRAMUSCULAR | Status: DC | PRN
  Administered 2018-03-12 (×2): 4 mg via INTRAVENOUS
  Filled 2018-03-12 (×2): qty 2

## 2018-03-12 NOTE — Progress Notes (Signed)
Medications: Aredia and Calcitonin did not get verified or sent up from pharmacy until much later than 2030 as directed by MD. Once medication was received pt had just went to sleep and daughter did not want pt to be disturbed due to nausea, vomiting, and headache. Daughter refused to have medication given at that time. RN did explain what the medication was for and daughter stated " I would just rather wait.".  This is why the medication was not given at the scheduled time.  Larey Days, RN

## 2018-03-12 NOTE — Evaluation (Signed)
Physical Therapy Evaluation Patient Details Name: Anne Bradshaw MRN: [id number] DOB: 12-24-1927 Today's Date: 03/12/2018   History of Present Illness  Pt is an 85 y/o female who presents to the ED s/p fall ~8 days PTA with AMS, fatigue, pain, and poor oral intake. CT scan negative for acute injuries (at [facility] immediately after fall, d/c home), however CT of spine here at [facility] revealed lucencies in cervical spine concerning for myeloma. In the ED pt was found to have AKI, hypercalcemia.    Clinical Impression  Pt admitted with above diagnosis. Pt currently with functional limitations due to the deficits listed below (see PT Problem List). At the time of PT eval pt was able to perform transfers to/from [facility] with up to +2 max assist for balance support and safety. Discussed baseline of function with family and recommendation of post-acute rehab. If pt progresses with therapies acutely, would like to explore the possibility of [facility] if pt qualifies. If pt does not qualify, she will likely need SNF at d/c. Acutely, pt will benefit from skilled PT to increase their independence and safety with mobility to allow discharge to the venue listed below.    Follow Up Recommendations SNF;Supervision/Assistance - 24 hour    Equipment Recommendations  Other (comment)(TBD by next venue of care or progress with PT)    Recommendations for Other Services       Precautions / Restrictions Precautions Precautions: Fall Restrictions Weight Bearing Restrictions: No      Mobility  Bed Mobility Overal bed mobility: Needs Assistance Bed Mobility: Rolling;Sidelying to Sit;Sit to Supine Rolling: Mod assist;+2 for physical assistance Sidelying to sit: Min assist;+2 for physical assistance   Sit to supine: Max assist;+2 for physical assistance   General bed mobility comments: Hand over hand assist to reach for rails. Pt initiating movement towards EOB however required assist to complete.    Transfers Overall transfer level: Needs assistance Equipment used: 2 person hand held assist Transfers: Sit to/from [employer] Sit to Stand: Max assist;+2 safety/equipment Stand pivot transfers: Max assist;+2 physical assistance;+2 safety/equipment       General transfer comment: Heavy posterior lean and difficulty extending hips/knees into standing. Pt was able to take a few pivotal steps around to/from [facility] with +2 assist for balance support and safety.   Ambulation/Gait             General Gait Details: Unable at this time.   Stairs            Wheelchair Mobility    Modified Rankin (Stroke Patients Only)       Balance Overall balance assessment: Needs assistance Sitting-balance support: Feet supported;Bilateral upper extremity supported Sitting balance-Leahy Scale: Poor Sitting balance - Comments: Requires at least 1 UE support on bed to maintain sitting balance   Standing balance support: Bilateral upper extremity supported;During functional activity Standing balance-Leahy Scale: Zero Standing balance comment: Max assist required                             Pertinent Vitals/Pain Pain Assessment: Faces Faces Pain Scale: Hurts little more Pain Location: Generalized pain in back/trunk during mobility Pain Descriptors / Indicators: Discomfort Pain Intervention(s): Limited activity within patient's tolerance;Monitored during session;Repositioned    Home Living Family/patient expects to be discharged to:: Private residence Living Arrangements: Spouse/significant other Available Help at Discharge: Family;Available 24 hours/day Type of Home: House Home Access: Stairs to enter   [employer] of  Steps: 3 Home Layout: One level Home Equipment: Walker - 2 wheels;Walker - 4 wheels Additional Comments: lives with elderly spouse     Prior Function Level of Independence: Needs assistance   Gait / Transfers Assistance  Needed: Pt required assistance for ambulation from spouse with a week long deterioration in function   ADL's / Homemaking Assistance Needed: Spouse was assisting with ADLs at home   Comments: Prior to 8-9 days ago pt was independent without an AD. After pt fell, function declined.      Hand Dominance        Extremity/Trunk Assessment   Upper Extremity Assessment Upper Extremity Assessment: Defer to OT evaluation    Lower Extremity Assessment Lower Extremity Assessment: Generalized weakness    Cervical / Trunk Assessment Cervical / Trunk Assessment: Kyphotic  Communication   Communication: No difficulties  Cognition Arousal/Alertness: Lethargic Behavior During Therapy: Flat affect Overall Cognitive Status: Impaired/Different from baseline Area of Impairment: Orientation;Following commands;Safety/judgement;Problem solving;Awareness                 Orientation Level: Disoriented to;Place     Following Commands: Follows one step commands consistently;Follows one step commands with increased time Safety/Judgement: Decreased awareness of safety Awareness: Intellectual Problem Solving: Slow processing;Decreased initiation;Difficulty sequencing;Requires verbal cues;Requires tactile cues        General Comments      Exercises     Assessment/Plan    PT Assessment Patient needs continued PT services  PT Problem List Decreased strength;Decreased activity tolerance;Decreased balance;Decreased mobility;Decreased cognition;Decreased knowledge of use of DME;Decreased safety awareness;Decreased knowledge of precautions;Pain       PT Treatment Interventions DME instruction;Gait training;Stair training;Functional mobility training;Therapeutic activities;Therapeutic exercise;Neuromuscular re-education;Patient/family education    PT Goals (Current goals can be found in the Care Plan section)  Acute Rehab PT Goals Patient Stated Goal: Family's goal is back to baseline PT  Goal Formulation: With patient/family Time For Goal Achievement: 03/26/18 Potential to Achieve Goals: Fair    Frequency Min 3X/week   Barriers to discharge        Co-evaluation PT/OT/SLP Co-Evaluation/Treatment: Yes Reason for Co-Treatment: Complexity of the patient's impairments (multi-system involvement);For patient/therapist safety;To address functional/ADL transfers PT goals addressed during session: Mobility/safety with mobility;Balance         AM-PAC PT "6 Clicks" Mobility  Outcome Measure Help needed turning from your back to your side while in a flat bed without using bedrails?: A Lot Help needed moving from lying on your back to sitting on the side of a flat bed without using bedrails?: A Lot Help needed moving to and from a bed to a chair (including a wheelchair)?: A Lot Help needed standing up from a chair using your arms (e.g., wheelchair or bedside chair)?: A Lot Help needed to walk in hospital room?: Total Help needed climbing 3-5 steps with a railing? : Total 6 Click Score: 10    End of Session Equipment Utilized During Treatment: Gait belt Activity Tolerance: Patient limited by fatigue Patient left: in bed;with call bell/phone within reach;with family/visitor present Nurse Communication: Mobility status PT Visit Diagnosis: Muscle weakness (generalized) (M62.81);History of falling (Z91.81);Difficulty in walking, not elsewhere classified (R26.2)    Time: 6503-5465 PT Time Calculation (min) (ACUTE ONLY): 33 min   Charges:   PT Evaluation $PT Eval Moderate Complexity: 1 Mod          Anne Bradshaw, PT, DPT Acute Rehabilitation Services Pager: [phone] Office: [phone]   Marylynn Pearson 03/12/2018, 3:56 PM

## 2018-03-12 NOTE — Progress Notes (Signed)
OT Cancellation Note  Patient Details Name: LAKEVIA WAWRZYNIAK MRN: [id number] DOB: [birth year]   Cancelled Treatment:    Reason Eval/Treat Not Completed: Fatigue/lethargy limiting ability to participate.  RN requesting therapies allow pt to rest this am.  Will reattempt as schedule permits this pm.   Jeani Hawking, OTR/L Acute Rehabilitation Services Pager [phone] Office [phone]   Jeani Hawking M 03/12/2018, 12:11 PM

## 2018-03-12 NOTE — Progress Notes (Signed)
PT Cancellation Note  Patient Details Name: Anne Bradshaw MRN: [id number] DOB: 1927-05-31   Cancelled Treatment:    Reason Eval/Treat Not Completed: Patient not medically ready. Discussed pt case with RN who asks that therapies check back after lunch to initiate evaluation. RN states she would like pt to rest at this time. Will continue to follow.    Marylynn Pearson 03/12/2018, 11:17 AM   Conni Slipper, PT, DPT Acute Rehabilitation Services Pager: [phone] Office: [phone]

## 2018-03-12 NOTE — Progress Notes (Addendum)
Patient ID: Anne Bradshaw, female   DOB: [birth year], 85 y.o.   MRN: [id number] [facility] Progress Note   Assessment/ Plan:   1.  Hypercalcemia: Status post initial treatment with bisphosphonate and ongoing volume expansion with saline.  Awaiting testing for multiple myeloma/plasma cell dyscrasia and earlier today had lumbar/thoracic imaging as well as part of bone series after cervical spine imaging showing suggestion of lytic lesions versus osteopenia.  Her hydrochlorothiazide has been discontinued and vitamin D is on hold.  Calcium level improving and anticipate to improve further with calcitonin.  She does not have anemia but is hypoalbuminemic.  Based on labs from 11/2017- creatinine 0.8 and calcium was 10.1. 2.  Metabolic encephalopathy: Secondary to hypercalcemia, slightly better but not back to baseline per her daughter [name]. 3.  Acute kidney injury: Secondary to hypercalcemia, monitor with volume expansion. 4.  Hypertension: Likely exacerbated by ongoing volume expansion/saline loading, on amlodipine and will add low-dose hydralazine.  Subjective:   Reports to be feeling tired and having difficulty getting into a comfortable position in bed.  Daughter at bedside notes that her mentation is still not at baseline.   Objective:   BP (!) 157/81 (BP Location: Left Arm)   Pulse 78   Temp 99 F (37.2 C) (Oral)   Resp 20   Ht _0  (1.626 m)   Wt 56.7 kg   SpO2 95%   BMI 21.46 kg/m   Intake/Output Summary (Last 24 hours) at 03/12/2018 4163 Last data filed at 03/12/2018 0200 Gross per 24 hour  Intake 120 ml  Output 176 ml  Net -56 ml   Weight change:   Physical Exam: Gen: Appears to be uncomfortable resting in bed, somnolent but awakens to questioning CVS: Pulse regular rhythm, S1 and S2 normal without any murmurs or rubs Resp: Clear to auscultation, no rales/rhonchi Abd: Soft, flat, nontender Ext: Without lower extremity edema  Imaging: Dg Chest 2  View  Result Date: 03/11/2018 CLINICAL DATA:  Recent fall, initial encounter EXAM: CHEST - 2 VIEW COMPARISON:  12/11/2014 FINDINGS: Cardiac shadows within normal limits. Aortic calcifications are noted. Lungs are hyperinflated without focal infiltrate or sizable effusion. No acute bony abnormality is noted. IMPRESSION: No acute abnormality noted. Electronically Signed   By: Inez Catalina M.D.   On: 03/11/2018 15:18   Ct Head Wo Contrast  Result Date: 03/11/2018 CLINICAL DATA:  Altered level of consciousness, confusion EXAM: CT HEAD WITHOUT CONTRAST CT CERVICAL SPINE WITHOUT CONTRAST TECHNIQUE: Multidetector CT imaging of the head and cervical spine was performed following the standard protocol without intravenous contrast. Multiplanar CT image reconstructions of the cervical spine were also generated. COMPARISON:  None. FINDINGS: CT HEAD FINDINGS Brain: No evidence of acute infarction, hemorrhage, extra-axial collection, ventriculomegaly, or mass effect. Generalized cerebral atrophy. Periventricular white matter low attenuation likely secondary to microangiopathy. Vascular: Cerebrovascular atherosclerotic calcifications are noted. Skull: Negative for fracture or focal lesion. Sinuses/Orbits: Visualized portions of the orbits are unremarkable. Visualized portions of the paranasal sinuses and mastoid air cells are unremarkable. Other: None. CT CERVICAL SPINE FINDINGS Alignment: Normal. Skull base and vertebrae: No acute fracture. Multiple small lucent lesions in the cervical spine vertebral bodies and posterior elements . Soft tissues and spinal canal: No prevertebral fluid or swelling. No visible canal hematoma. Disc levels: Degenerative disc disease with disc height loss at C4-5 and C5-6. Broad-based disc osteophyte complexes at C4-5 and C5-6 with bilateral uncovertebral degenerative changes. Moderate right facet arthropathy at C2-3. Moderate right and mild left  facet arthropathy at C3-4. Moderate bilateral  facet arthropathy at C4-5 with mild bilateral foraminal narrowing. Mild bilateral facet arthropathy and bilateral uncovertebral degenerative changes at C5-6 with foraminal stenosis. Upper chest: Biapical fibrosis Other: No fluid collection or hematoma. IMPRESSION: 1. No acute intracranial pathology. 2.  No acute osseous injury of the cervical spine. 3. Cervical spine spondylosis as described above. 4. Multiple small lucent lesions in the cervical spine vertebral bodies and posterior elements which may be secondary to osteopenia, but a similar appearance can be seen in the setting of multiple myeloma. Correlate with laboratory values. Electronically Signed   By: Kathreen Devoid   On: 03/11/2018 13:40   Ct Cervical Spine Wo Contrast  Result Date: 03/11/2018 CLINICAL DATA:  Altered level of consciousness, confusion EXAM: CT HEAD WITHOUT CONTRAST CT CERVICAL SPINE WITHOUT CONTRAST TECHNIQUE: Multidetector CT imaging of the head and cervical spine was performed following the standard protocol without intravenous contrast. Multiplanar CT image reconstructions of the cervical spine were also generated. COMPARISON:  None. FINDINGS: CT HEAD FINDINGS Brain: No evidence of acute infarction, hemorrhage, extra-axial collection, ventriculomegaly, or mass effect. Generalized cerebral atrophy. Periventricular white matter low attenuation likely secondary to microangiopathy. Vascular: Cerebrovascular atherosclerotic calcifications are noted. Skull: Negative for fracture or focal lesion. Sinuses/Orbits: Visualized portions of the orbits are unremarkable. Visualized portions of the paranasal sinuses and mastoid air cells are unremarkable. Other: None. CT CERVICAL SPINE FINDINGS Alignment: Normal. Skull base and vertebrae: No acute fracture. Multiple small lucent lesions in the cervical spine vertebral bodies and posterior elements . Soft tissues and spinal canal: No prevertebral fluid or swelling. No visible canal hematoma. Disc  levels: Degenerative disc disease with disc height loss at C4-5 and C5-6. Broad-based disc osteophyte complexes at C4-5 and C5-6 with bilateral uncovertebral degenerative changes. Moderate right facet arthropathy at C2-3. Moderate right and mild left facet arthropathy at C3-4. Moderate bilateral facet arthropathy at C4-5 with mild bilateral foraminal narrowing. Mild bilateral facet arthropathy and bilateral uncovertebral degenerative changes at C5-6 with foraminal stenosis. Upper chest: Biapical fibrosis Other: No fluid collection or hematoma. IMPRESSION: 1. No acute intracranial pathology. 2.  No acute osseous injury of the cervical spine. 3. Cervical spine spondylosis as described above. 4. Multiple small lucent lesions in the cervical spine vertebral bodies and posterior elements which may be secondary to osteopenia, but a similar appearance can be seen in the setting of multiple myeloma. Correlate with laboratory values. Electronically Signed   By: Kathreen Devoid   On: 03/11/2018 13:40   Dg Pelvis Comp Min 3v  Result Date: 03/11/2018 CLINICAL DATA:  Recent fall with hip pain, initial encounter EXAM: JUDET PELVIS - 3+ VIEW COMPARISON:  03/03/2018 FINDINGS: Pelvic ring is intact. Postsurgical changes are noted in the region of right groin. No acute fracture or dislocation is noted. No soft tissue abnormality is seen. IMPRESSION: No acute abnormality noted. Electronically Signed   By: Inez Catalina M.D.   On: 03/11/2018 15:23   Labs: BMET Recent Labs  Lab 03/11/18 1323 03/11/18 1912 03/11/18 2258 03/12/18 0430  NA 137  --  134* 137  K 3.2*  --  2.9* 3.2*  CL 96*  --  98 102  CO2 31  --  27 24  GLUCOSE 113*  --  116* 149*  BUN 33*  --  31* 29*  CREATININE 1.98* 1.88* 1.84* 1.82*  CALCIUM 14.1*  --  12.9* 12.2*  PHOS 3.1  --   --   --    CBC  Recent Labs  Lab 03/11/18 1323 03/11/18 1912  WBC 4.9 5.5  NEUTROABS 2.9  --   HGB 11.7* 12.0  HCT 35.3* 35.3*  MCV 95.1 93.6  PLT 186 184     Medications:    . acidophilus   Oral QPM  . amLODipine  5 mg Oral Daily  . enoxaparin (LOVENOX) injection  30 mg Subcutaneous Q24H  . famciclovir  500 mg Oral Daily  . lidocaine  1 patch Transdermal Q24H  . prednisoLONE acetate  1 drop Both Eyes QODAY  . vitamin B-12  1,000 mcg Oral Daily   Elmarie Shiley, MD 03/12/2018, 9:29 AM

## 2018-03-12 NOTE — Progress Notes (Signed)
[facility]  Patient Status: Active (receiving services up to time of hospitalization)  [facility] is providing the following services: PT  If patient discharges after hours, please call [phone].   Kizzie Furnish 03/12/2018, 11:24 AM

## 2018-03-12 NOTE — Progress Notes (Addendum)
PROGRESS NOTE    Anne Bradshaw  MPN:[id number] DOB: 03-30-1927 DOA: 03/11/2018 PCP: Mayra Neer, MD   Brief Narrative: 85 year old female with history except for GERD, hypertension on HCTZ, macular degeneration brought to the ER for evaluation of altered mental status, fatigue, poor oral intake since her fall 8 days back where at [facility] long  ED CT scan showed no acute injuries, was discharged home however continued to have severe muscle aches and pain brought to the ER  In the ER, found to have AKI, hypercalcemia, CT of the c-spine showed lucency indicating myeloma, CT head was negative blood pressure was in 008Q systolic.  Patient was admitted for further management and nephrology was consulted.  Subjective: Seen this morning able to tell me her name appears confused. Complains of some epigastric discomfort.  Assessment & Plan:  Altered mental status/encephalopathy, toxic metabolic in the setting of hypercalcemia, severe dehydration.  Continue supportive care, continue hydration.  Appears alert awake but somewhat confused. Cont supportive care, IV hydration.  Hypercalcemia:  14.1-->12.2. ? Etiology could be myeloma, work-up in progress.  Imaging study shows lytic lesions versus osteopenia.  Has mild albuminemia but no anemia . S/p pamidronate 60 mg iv and calcitonin to 4u/kg (226 units) x1.  Calcium down-trending.  Continue to monitor calcium continue IV fluid hydration.  Nephrology on board and appreciate input.  Vitamin D/HCTZ on hold.  AKI : Secondary to hypercalcemia/dehydration.  Creatinine slowly  improving on ivf, cont same Recent Labs  Lab 03/11/18 1323 03/11/18 1912 03/11/18 2258 03/12/18 0430  CREATININE 1.98* 1.88* 1.84* 1.82*   Hypertension: blood pressure poorly controlled, adding low-dose hydralazine, and amlodipine.  Hypokalemia: Repleted this morning.  ? UTI : On empiric ceftriaxone.  Follow-up on culture-10,000 gram-negative rods..  Epigastric pain/?   Chest pain, with vomiting: Obtain EKG/troponin.  appears atypical, added protonix, cut back to liquid diet  Back pain: No acute finding on CT thoracic and lumbar spine.  Continue pain control,  DVT prophylaxis: LOVENOX Code Status: DNR Family Communication: no family at bedside Disposition Plan:  remains inpatient pending clinical improvement.  Will need PT OT evaluation once more stable.  Consultants:  Neurology Procedures: none 03/11/18   CT C spine  1. No acute intracranial pathology. 2.  No acute osseous injury of the cervical spine. 3. Cervical spine spondylosis as described above. 4. Multiple small lucent lesions in the cervical spine vertebral bodies and posterior elements which may be secondary to osteopenia, but a similar appearance can be seen in the setting of multiple myeloma. Correlate with laboratory values.  03/12/18 CT THORACIC SPINE IMPRESSION  1. No acute finding. 2. Usual degenerative changes.  CT LUMBAR SPINE IMPRESSION  1. No evidence of lumbar spine fracture. 2. Presacral fat stranding without visible fracture of the covered Sacrum.  Antimicrobials: Anti-infectives (From admission, onward)   Start     Dose/Rate Route Frequency Ordered Stop   03/12/18 1000  famciclovir [facility]) tablet 500 mg     500 mg Oral Daily 03/11/18 1705     03/11/18 2000  cefTRIAXone (ROCEPHIN) 1 g in sodium chloride 0.9 % 100 mL IVPB     1 g 200 mL/hr over 30 Minutes Intravenous Every 24 hours 03/11/18 1904         Objective: Vitals:   03/11/18 2218 03/12/18 0228 03/12/18 0358 03/12/18 0900  BP: (!) 173/72 (!) 148/61 (!) 157/81 (!) 176/63  Pulse: 69 67 78 80  Resp:   20   Temp:   99 F (  37.2 C) 98 F (36.7 C)  TempSrc:   Oral Oral  SpO2: 95% 96% 95% 98%  Weight:      Height:        Intake/Output Summary (Last 24 hours) at 03/12/2018 1115 Last data filed at 03/12/2018 1048 Gross per 24 hour  Intake 240 ml  Output 576 ml  Net -336 ml   Filed Weights    03/11/18 1206 03/11/18 1936  Weight: 49.4 kg 56.7 kg   Weight change:   Body mass index is 21.46 kg/m.  Intake/Output from previous day: 01/19 0701 - 01/20 0700 In: 120 [P.O.:120] Out: 176 [Urine:175; Emesis/NG output:1] Intake/Output this shift: Total I/O In: 120 [P.O.:120] Out: 400 [Urine:400]  Examination:  General exam: Appears calm and comfortable, alert awake  HEENT:PERRL,Oral mucosa DRY, Ear/Nose normal on gross exam Respiratory system: Bilateral equal air entry, normal vesicular breath sounds, no wheezes or crackles  Cardiovascular system: S1 & S2 heard,No JVD, murmurs. Gastrointestinal system: Abdomen is  soft, non tender, non distended, BS +  Nervous System:Alert/confused. No focal neurological deficits, but generalized weakness in her extremities. Extremities: No edema, no clubbing, distal peripheral pulses palpable. Skin: No rashes, lesions, no icterus MSK: Normal muscle bulk,tone ,power  Medications:  Scheduled Meds: . acidophilus   Oral QPM  . amLODipine  5 mg Oral Daily  . enoxaparin (LOVENOX) injection  30 mg Subcutaneous Q24H  . famciclovir  500 mg Oral Daily  . lidocaine  1 patch Transdermal Q24H  . pantoprazole (PROTONIX) IV  40 mg Intravenous Q24H  . prednisoLONE acetate  1 drop Both Eyes QODAY  . vitamin B-12  1,000 mcg Oral Daily   Continuous Infusions: . sodium chloride 150 mL/hr at 03/11/18 2028  . cefTRIAXone (ROCEPHIN)  IV 1 g (03/11/18 2047)    Data Reviewed: I have personally reviewed following labs and imaging studies  CBC: Recent Labs  Lab 03/11/18 1323 03/11/18 1912  WBC 4.9 5.5  NEUTROABS 2.9  --   HGB 11.7* 12.0  HCT 35.3* 35.3*  MCV 95.1 93.6  PLT 186 803   Basic Metabolic Panel: Recent Labs  Lab 03/11/18 1323 03/11/18 1912 03/11/18 2258 03/12/18 0430  NA 137  --  134* 137  K 3.2*  --  2.9* 3.2*  CL 96*  --  98 102  CO2 31  --  27 24  GLUCOSE 113*  --  116* 149*  BUN 33*  --  31* 29*  CREATININE 1.98* 1.88*  1.84* 1.82*  CALCIUM 14.1*  --  12.9* 12.2*  MG 1.9  --   --   --   PHOS 3.1  --   --   --    GFR: Estimated Creatinine Clearance: 18.5 mL/min (A) (by C-G formula based on SCr of 1.82 mg/dL (H)). Liver Function Tests: Recent Labs  Lab 03/11/18 1323 03/12/18 0430  AST 53* 47*  ALT 36 33  ALKPHOS 61 54  BILITOT 0.8 0.7  PROT 7.5 7.2  ALBUMIN 3.6 3.2*   No results for input(s): LIPASE, AMYLASE in the last 168 hours. No results for input(s): AMMONIA in the last 168 hours. Coagulation Profile: No results for input(s): INR, PROTIME in the last 168 hours. Cardiac Enzymes: No results for input(s): CKTOTAL, CKMB, CKMBINDEX, TROPONINI in the last 168 hours. BNP (last 3 results) No results for input(s): PROBNP in the last 8760 hours. HbA1C: No results for input(s): HGBA1C in the last 72 hours. CBG: No results for input(s): GLUCAP in the last 168 hours.  Lipid Profile: No results for input(s): CHOL, HDL, LDLCALC, TRIG, CHOLHDL, LDLDIRECT in the last 72 hours. Thyroid Function Tests: No results for input(s): TSH, T4TOTAL, FREET4, T3FREE, THYROIDAB in the last 72 hours. Anemia Panel: No results for input(s): VITAMINB12, FOLATE, FERRITIN, TIBC, IRON, RETICCTPCT in the last 72 hours. Sepsis Labs: No results for input(s): PROCALCITON, LATICACIDVEN in the last 168 hours.  No results found for this or any previous visit (from the past 240 hour(s)).    Radiology Studies: Dg Chest 2 View  Result Date: 03/11/2018 CLINICAL DATA:  Recent fall, initial encounter EXAM: CHEST - 2 VIEW COMPARISON:  12/11/2014 FINDINGS: Cardiac shadows within normal limits. Aortic calcifications are noted. Lungs are hyperinflated without focal infiltrate or sizable effusion. No acute bony abnormality is noted. IMPRESSION: No acute abnormality noted. Electronically Signed   By: Inez Catalina M.D.   On: 03/11/2018 15:18   Ct Head Wo Contrast  Result Date: 03/11/2018 CLINICAL DATA:  Altered level of consciousness,  confusion EXAM: CT HEAD WITHOUT CONTRAST CT CERVICAL SPINE WITHOUT CONTRAST TECHNIQUE: Multidetector CT imaging of the head and cervical spine was performed following the standard protocol without intravenous contrast. Multiplanar CT image reconstructions of the cervical spine were also generated. COMPARISON:  None. FINDINGS: CT HEAD FINDINGS Brain: No evidence of acute infarction, hemorrhage, extra-axial collection, ventriculomegaly, or mass effect. Generalized cerebral atrophy. Periventricular white matter low attenuation likely secondary to microangiopathy. Vascular: Cerebrovascular atherosclerotic calcifications are noted. Skull: Negative for fracture or focal lesion. Sinuses/Orbits: Visualized portions of the orbits are unremarkable. Visualized portions of the paranasal sinuses and mastoid air cells are unremarkable. Other: None. CT CERVICAL SPINE FINDINGS Alignment: Normal. Skull base and vertebrae: No acute fracture. Multiple small lucent lesions in the cervical spine vertebral bodies and posterior elements . Soft tissues and spinal canal: No prevertebral fluid or swelling. No visible canal hematoma. Disc levels: Degenerative disc disease with disc height loss at C4-5 and C5-6. Broad-based disc osteophyte complexes at C4-5 and C5-6 with bilateral uncovertebral degenerative changes. Moderate right facet arthropathy at C2-3. Moderate right and mild left facet arthropathy at C3-4. Moderate bilateral facet arthropathy at C4-5 with mild bilateral foraminal narrowing. Mild bilateral facet arthropathy and bilateral uncovertebral degenerative changes at C5-6 with foraminal stenosis. Upper chest: Biapical fibrosis Other: No fluid collection or hematoma. IMPRESSION: 1. No acute intracranial pathology. 2.  No acute osseous injury of the cervical spine. 3. Cervical spine spondylosis as described above. 4. Multiple small lucent lesions in the cervical spine vertebral bodies and posterior elements which may be secondary to  osteopenia, but a similar appearance can be seen in the setting of multiple myeloma. Correlate with laboratory values. Electronically Signed   By: Kathreen Devoid   On: 03/11/2018 13:40   Ct Cervical Spine Wo Contrast  Result Date: 03/11/2018 CLINICAL DATA:  Altered level of consciousness, confusion EXAM: CT HEAD WITHOUT CONTRAST CT CERVICAL SPINE WITHOUT CONTRAST TECHNIQUE: Multidetector CT imaging of the head and cervical spine was performed following the standard protocol without intravenous contrast. Multiplanar CT image reconstructions of the cervical spine were also generated. COMPARISON:  None. FINDINGS: CT HEAD FINDINGS Brain: No evidence of acute infarction, hemorrhage, extra-axial collection, ventriculomegaly, or mass effect. Generalized cerebral atrophy. Periventricular white matter low attenuation likely secondary to microangiopathy. Vascular: Cerebrovascular atherosclerotic calcifications are noted. Skull: Negative for fracture or focal lesion. Sinuses/Orbits: Visualized portions of the orbits are unremarkable. Visualized portions of the paranasal sinuses and mastoid air cells are unremarkable. Other: None. CT CERVICAL SPINE FINDINGS Alignment: Normal.  Skull base and vertebrae: No acute fracture. Multiple small lucent lesions in the cervical spine vertebral bodies and posterior elements . Soft tissues and spinal canal: No prevertebral fluid or swelling. No visible canal hematoma. Disc levels: Degenerative disc disease with disc height loss at C4-5 and C5-6. Broad-based disc osteophyte complexes at C4-5 and C5-6 with bilateral uncovertebral degenerative changes. Moderate right facet arthropathy at C2-3. Moderate right and mild left facet arthropathy at C3-4. Moderate bilateral facet arthropathy at C4-5 with mild bilateral foraminal narrowing. Mild bilateral facet arthropathy and bilateral uncovertebral degenerative changes at C5-6 with foraminal stenosis. Upper chest: Biapical fibrosis Other: No fluid  collection or hematoma. IMPRESSION: 1. No acute intracranial pathology. 2.  No acute osseous injury of the cervical spine. 3. Cervical spine spondylosis as described above. 4. Multiple small lucent lesions in the cervical spine vertebral bodies and posterior elements which may be secondary to osteopenia, but a similar appearance can be seen in the setting of multiple myeloma. Correlate with laboratory values. Electronically Signed   By: Kathreen Devoid   On: 03/11/2018 13:40   Ct Thoracic Spine Wo Contrast  Result Date: 03/12/2018 CLINICAL DATA:  Back pain with minor trauma EXAM: CT THORACIC AND LUMBAR SPINE WITHOUT CONTRAST TECHNIQUE: Multidetector CT imaging of the thoracic and lumbar spine was performed without contrast. Multiplanar CT image reconstructions were also generated. COMPARISON:  Abdominal CT 07/17/2014 FINDINGS: CT THORACIC SPINE FINDINGS Alignment: No traumatic malalignment. Vertebrae: Negative for fracture Paraspinal and other soft tissues: Reticulonodular opacities in the bilateral apical lungs, a similar pattern was seen in 2016 and this is likely chronic indolent infection or post infectious scarring. No evident hematoma or swelling. Proximal gastric diverticulum Disc levels: Midthoracic disc narrowing and endplate degeneration. Unremarkable facets. No evident impingement CT LUMBAR SPINE FINDINGS Segmentation: 5 lumbar type vertebral bodies Alignment: These no traumatic malalignment. Degenerative grade 1 anterolisthesis at L5-S1 Vertebrae: Negative for fracture, bone lesion, or erosion. Paraspinal and other soft tissues: Mild presacral stranding. There is atherosclerosis and bilateral nephrolithiasis. Disc levels: Good disc height preservation for age. Degenerative facet spurring that is advanced at L5-S1 where there is also anterolisthesis. IMPRESSION: CT THORACIC SPINE IMPRESSION 1. No acute finding. 2. Usual degenerative changes. CT LUMBAR SPINE IMPRESSION 1. No evidence of lumbar spine  fracture. 2. Presacral fat stranding without visible fracture of the covered sacrum. Electronically Signed   By: Monte Fantasia M.D.   On: 03/12/2018 09:53   Ct Lumbar Spine Wo Contrast  Result Date: 03/12/2018 CLINICAL DATA:  Back pain with minor trauma EXAM: CT THORACIC AND LUMBAR SPINE WITHOUT CONTRAST TECHNIQUE: Multidetector CT imaging of the thoracic and lumbar spine was performed without contrast. Multiplanar CT image reconstructions were also generated. COMPARISON:  Abdominal CT 07/17/2014 FINDINGS: CT THORACIC SPINE FINDINGS Alignment: No traumatic malalignment. Vertebrae: Negative for fracture Paraspinal and other soft tissues: Reticulonodular opacities in the bilateral apical lungs, a similar pattern was seen in 2016 and this is likely chronic indolent infection or post infectious scarring. No evident hematoma or swelling. Proximal gastric diverticulum Disc levels: Midthoracic disc narrowing and endplate degeneration. Unremarkable facets. No evident impingement CT LUMBAR SPINE FINDINGS Segmentation: 5 lumbar type vertebral bodies Alignment: These no traumatic malalignment. Degenerative grade 1 anterolisthesis at L5-S1 Vertebrae: Negative for fracture, bone lesion, or erosion. Paraspinal and other soft tissues: Mild presacral stranding. There is atherosclerosis and bilateral nephrolithiasis. Disc levels: Good disc height preservation for age. Degenerative facet spurring that is advanced at L5-S1 where there is also anterolisthesis. IMPRESSION: CT THORACIC  SPINE IMPRESSION 1. No acute finding. 2. Usual degenerative changes. CT LUMBAR SPINE IMPRESSION 1. No evidence of lumbar spine fracture. 2. Presacral fat stranding without visible fracture of the covered sacrum. Electronically Signed   By: Monte Fantasia M.D.   On: 03/12/2018 09:53   Dg Pelvis Comp Min 3v  Result Date: 03/11/2018 CLINICAL DATA:  Recent fall with hip pain, initial encounter EXAM: JUDET PELVIS - 3+ VIEW COMPARISON:  03/03/2018  FINDINGS: Pelvic ring is intact. Postsurgical changes are noted in the region of right groin. No acute fracture or dislocation is noted. No soft tissue abnormality is seen. IMPRESSION: No acute abnormality noted. Electronically Signed   By: Inez Catalina M.D.   On: 03/11/2018 15:23      LOS: 1 day   Time spent: More than 50% of that time was spent in counseling and/or coordination of care.  Antonieta Pert, MD [facility]  03/12/2018, 11:15 AM

## 2018-03-13 LAB — PROTEIN ELECTRO, RANDOM URINE
ALPHA-1-GLOBULIN, U: 4.8 %
Albumin ELP, Urine: 42.7 %
Alpha-2-Globulin, U: 8 %
Beta Globulin, U: 24.9 %
Gamma Globulin, U: 19.6 %
Total Protein, Urine: 18 mg/dL

## 2018-03-13 LAB — PTH, INTACT AND CALCIUM
CALCIUM TOTAL (PTH): 13.1 mg/dL — AB (ref 8.7–10.3)
PTH: 10 pg/mL — ABNORMAL LOW (ref 15–65)

## 2018-03-13 LAB — CBC
HEMATOCRIT: 29.7 % — AB (ref 36.0–46.0)
Hemoglobin: 9.8 g/dL — ABNORMAL LOW (ref 12.0–15.0)
MCH: 31.9 pg (ref 26.0–34.0)
MCHC: 33 g/dL (ref 30.0–36.0)
MCV: 96.7 fL (ref 80.0–100.0)
NRBC: 0 % (ref 0.0–0.2)
Platelets: 190 10*3/uL (ref 150–400)
RBC: 3.07 MIL/uL — ABNORMAL LOW (ref 3.87–5.11)
RDW: 13.9 % (ref 11.5–15.5)
WBC: 7.9 10*3/uL (ref 4.0–10.5)

## 2018-03-13 LAB — VITAMIN D 25 HYDROXY (VIT D DEFICIENCY, FRACTURES): Vit D, 25-Hydroxy: 45.5 ng/mL (ref 30.0–100.0)

## 2018-03-13 LAB — PROTEIN ELECTROPHORESIS, SERUM
A/G Ratio: 0.9 (ref 0.7–1.7)
Albumin ELP: 3.4 g/dL (ref 2.9–4.4)
Alpha-1-Globulin: 0.2 g/dL (ref 0.0–0.4)
Alpha-2-Globulin: 1.1 g/dL — ABNORMAL HIGH (ref 0.4–1.0)
Beta Globulin: 1 g/dL (ref 0.7–1.3)
Gamma Globulin: 1.6 g/dL (ref 0.4–1.8)
Globulin, Total: 3.8 g/dL (ref 2.2–3.9)
Total Protein ELP: 7.2 g/dL (ref 6.0–8.5)

## 2018-03-13 LAB — BASIC METABOLIC PANEL
Anion gap: 9 (ref 5–15)
BUN: 33 mg/dL — ABNORMAL HIGH (ref 8–23)
CO2: 24 mmol/L (ref 22–32)
Calcium: 10.9 mg/dL — ABNORMAL HIGH (ref 8.9–10.3)
Chloride: 107 mmol/L (ref 98–111)
Creatinine, Ser: 2.01 mg/dL — ABNORMAL HIGH (ref 0.44–1.00)
GFR calc Af Amer: 25 mL/min — ABNORMAL LOW (ref >60)
GFR calc non Af Amer: 22 mL/min — ABNORMAL LOW (ref >60)
Glucose, Bld: 70 mg/dL (ref 70–99)
Potassium: 3.3 mmol/L — ABNORMAL LOW (ref 3.5–5.1)
Sodium: 140 mmol/L (ref 135–145)

## 2018-03-13 LAB — URINE CULTURE: Culture: 10000 — AB

## 2018-03-13 LAB — KAPPA/LAMBDA LIGHT CHAINS
Kappa free light chain: 73 mg/L — ABNORMAL HIGH (ref 3.3–19.4)
Kappa, lambda light chain ratio: 2.04 — ABNORMAL HIGH (ref 0.26–1.65)
Lambda free light chains: 35.7 mg/L — ABNORMAL HIGH (ref 5.7–26.3)

## 2018-03-13 LAB — TROPONIN I: Troponin I: 0.03 ng/mL (ref <0.03)

## 2018-03-13 MED ORDER — FAMCICLOVIR 500 MG PO TABS
250.0000 mg | ORAL_TABLET | Freq: Every day | ORAL | Status: DC
  Administered 2018-03-13: 250 mg via ORAL
  Filled 2018-03-13 (×2): qty 0.5

## 2018-03-13 NOTE — Progress Notes (Signed)
Lab called, calcium 13.1 from Ca + PTH test collected 03/11/2018. Notified NP Schorr.

## 2018-03-13 NOTE — Progress Notes (Signed)
Patient ID: Anne Bradshaw, female   DOB: Oct 11, 1927, 85 y.o.   MRN: [id number] [facility] Progress Note   Assessment/ Plan:   1.  Hypercalcemia: Calcium levels improving status post bisphosphonate and with ongoing forced diuresis with saline as well as shifting from calcitonin.  Mental status appears to be doing better.  PTH level appropriately suppressed and not indicative of primary hyperparathyroidism.  25-hydroxy vitamin D level within acceptable range, will check PTH RP and 1, 25 hydroxy vitamin D await serum protein electrophoresis and urine protein electrophoresis. 2.  Metabolic encephalopathy: Secondary to hypercalcemia, mental status noted to be improving with improving calcium levels and better sleep overnight. 3.  Acute kidney injury: Secondary to hypercalcemia, suspect that she likely has evolved into ischemic ATN from a sustained prerenal status, will give normal saline for another 24 hours and then discontinue or decrease rate to 50 cc/h. 4.  Hypertension: Likely exacerbated by ongoing volume expansion/saline loading, blood pressures acceptable for her advanced age.  Subjective:   Reports to be feeling somewhat better, excited that she had ice cream yesterday.  Poor appetite.   Objective:   BP (!) 146/77 (BP Location: Right Arm)   Pulse 71   Temp 98.4 F (36.9 C) (Oral)   Resp 20   Ht '5\' 4"'  (1.626 m)   Wt 54.9 kg   SpO2 91%   BMI 20.79 kg/m   Intake/Output Summary (Last 24 hours) at 03/13/2018 1037 Last data filed at 03/13/2018 0800 Gross per 24 hour  Intake 1680 ml  Output 1750 ml  Net -70 ml   Weight change: 5.53 kg  Physical Exam: Gen: Appears to be comfortable resting in bed, husband and daughter at bedside CVS: Pulse regular rhythm, S1 and S2 normal without any murmurs or rubs Resp: Clear to auscultation, no rales/rhonchi Abd: Soft, flat, nontender Ext: Without lower extremity edema  Imaging: Dg Chest 2 View  Result Date:  03/11/2018 CLINICAL DATA:  Recent fall, initial encounter EXAM: CHEST - 2 VIEW COMPARISON:  12/11/2014 FINDINGS: Cardiac shadows within normal limits. Aortic calcifications are noted. Lungs are hyperinflated without focal infiltrate or sizable effusion. No acute bony abnormality is noted. IMPRESSION: No acute abnormality noted. Electronically Signed   By: Inez Catalina M.D.   On: 03/11/2018 15:18   Ct Head Wo Contrast  Result Date: 03/11/2018 CLINICAL DATA:  Altered level of consciousness, confusion EXAM: CT HEAD WITHOUT CONTRAST CT CERVICAL SPINE WITHOUT CONTRAST TECHNIQUE: Multidetector CT imaging of the head and cervical spine was performed following the standard protocol without intravenous contrast. Multiplanar CT image reconstructions of the cervical spine were also generated. COMPARISON:  None. FINDINGS: CT HEAD FINDINGS Brain: No evidence of acute infarction, hemorrhage, extra-axial collection, ventriculomegaly, or mass effect. Generalized cerebral atrophy. Periventricular white matter low attenuation likely secondary to microangiopathy. Vascular: Cerebrovascular atherosclerotic calcifications are noted. Skull: Negative for fracture or focal lesion. Sinuses/Orbits: Visualized portions of the orbits are unremarkable. Visualized portions of the paranasal sinuses and mastoid air cells are unremarkable. Other: None. CT CERVICAL SPINE FINDINGS Alignment: Normal. Skull base and vertebrae: No acute fracture. Multiple small lucent lesions in the cervical spine vertebral bodies and posterior elements . Soft tissues and spinal canal: No prevertebral fluid or swelling. No visible canal hematoma. Disc levels: Degenerative disc disease with disc height loss at C4-5 and C5-6. Broad-based disc osteophyte complexes at C4-5 and C5-6 with bilateral uncovertebral degenerative changes. Moderate right facet arthropathy at C2-3. Moderate right and mild left facet arthropathy at C3-4. Moderate  bilateral facet arthropathy at  C4-5 with mild bilateral foraminal narrowing. Mild bilateral facet arthropathy and bilateral uncovertebral degenerative changes at C5-6 with foraminal stenosis. Upper chest: Biapical fibrosis Other: No fluid collection or hematoma. IMPRESSION: 1. No acute intracranial pathology. 2.  No acute osseous injury of the cervical spine. 3. Cervical spine spondylosis as described above. 4. Multiple small lucent lesions in the cervical spine vertebral bodies and posterior elements which may be secondary to osteopenia, but a similar appearance can be seen in the setting of multiple myeloma. Correlate with laboratory values. Electronically Signed   By: Kathreen Devoid   On: 03/11/2018 13:40   Ct Cervical Spine Wo Contrast  Result Date: 03/11/2018 CLINICAL DATA:  Altered level of consciousness, confusion EXAM: CT HEAD WITHOUT CONTRAST CT CERVICAL SPINE WITHOUT CONTRAST TECHNIQUE: Multidetector CT imaging of the head and cervical spine was performed following the standard protocol without intravenous contrast. Multiplanar CT image reconstructions of the cervical spine were also generated. COMPARISON:  None. FINDINGS: CT HEAD FINDINGS Brain: No evidence of acute infarction, hemorrhage, extra-axial collection, ventriculomegaly, or mass effect. Generalized cerebral atrophy. Periventricular white matter low attenuation likely secondary to microangiopathy. Vascular: Cerebrovascular atherosclerotic calcifications are noted. Skull: Negative for fracture or focal lesion. Sinuses/Orbits: Visualized portions of the orbits are unremarkable. Visualized portions of the paranasal sinuses and mastoid air cells are unremarkable. Other: None. CT CERVICAL SPINE FINDINGS Alignment: Normal. Skull base and vertebrae: No acute fracture. Multiple small lucent lesions in the cervical spine vertebral bodies and posterior elements . Soft tissues and spinal canal: No prevertebral fluid or swelling. No visible canal hematoma. Disc levels: Degenerative disc  disease with disc height loss at C4-5 and C5-6. Broad-based disc osteophyte complexes at C4-5 and C5-6 with bilateral uncovertebral degenerative changes. Moderate right facet arthropathy at C2-3. Moderate right and mild left facet arthropathy at C3-4. Moderate bilateral facet arthropathy at C4-5 with mild bilateral foraminal narrowing. Mild bilateral facet arthropathy and bilateral uncovertebral degenerative changes at C5-6 with foraminal stenosis. Upper chest: Biapical fibrosis Other: No fluid collection or hematoma. IMPRESSION: 1. No acute intracranial pathology. 2.  No acute osseous injury of the cervical spine. 3. Cervical spine spondylosis as described above. 4. Multiple small lucent lesions in the cervical spine vertebral bodies and posterior elements which may be secondary to osteopenia, but a similar appearance can be seen in the setting of multiple myeloma. Correlate with laboratory values. Electronically Signed   By: Kathreen Devoid   On: 03/11/2018 13:40   Ct Thoracic Spine Wo Contrast  Result Date: 03/12/2018 CLINICAL DATA:  Back pain with minor trauma EXAM: CT THORACIC AND LUMBAR SPINE WITHOUT CONTRAST TECHNIQUE: Multidetector CT imaging of the thoracic and lumbar spine was performed without contrast. Multiplanar CT image reconstructions were also generated. COMPARISON:  Abdominal CT 07/17/2014 FINDINGS: CT THORACIC SPINE FINDINGS Alignment: No traumatic malalignment. Vertebrae: Negative for fracture Paraspinal and other soft tissues: Reticulonodular opacities in the bilateral apical lungs, a similar pattern was seen in 2016 and this is likely chronic indolent infection or post infectious scarring. No evident hematoma or swelling. Proximal gastric diverticulum Disc levels: Midthoracic disc narrowing and endplate degeneration. Unremarkable facets. No evident impingement CT LUMBAR SPINE FINDINGS Segmentation: 5 lumbar type vertebral bodies Alignment: These no traumatic malalignment. Degenerative grade 1  anterolisthesis at L5-S1 Vertebrae: Negative for fracture, bone lesion, or erosion. Paraspinal and other soft tissues: Mild presacral stranding. There is atherosclerosis and bilateral nephrolithiasis. Disc levels: Good disc height preservation for age. Degenerative facet spurring that is advanced  at L5-S1 where there is also anterolisthesis. IMPRESSION: CT THORACIC SPINE IMPRESSION 1. No acute finding. 2. Usual degenerative changes. CT LUMBAR SPINE IMPRESSION 1. No evidence of lumbar spine fracture. 2. Presacral fat stranding without visible fracture of the covered sacrum. Electronically Signed   By: Monte Fantasia M.D.   On: 03/12/2018 09:53   Ct Lumbar Spine Wo Contrast  Result Date: 03/12/2018 CLINICAL DATA:  Back pain with minor trauma EXAM: CT THORACIC AND LUMBAR SPINE WITHOUT CONTRAST TECHNIQUE: Multidetector CT imaging of the thoracic and lumbar spine was performed without contrast. Multiplanar CT image reconstructions were also generated. COMPARISON:  Abdominal CT 07/17/2014 FINDINGS: CT THORACIC SPINE FINDINGS Alignment: No traumatic malalignment. Vertebrae: Negative for fracture Paraspinal and other soft tissues: Reticulonodular opacities in the bilateral apical lungs, a similar pattern was seen in 2016 and this is likely chronic indolent infection or post infectious scarring. No evident hematoma or swelling. Proximal gastric diverticulum Disc levels: Midthoracic disc narrowing and endplate degeneration. Unremarkable facets. No evident impingement CT LUMBAR SPINE FINDINGS Segmentation: 5 lumbar type vertebral bodies Alignment: These no traumatic malalignment. Degenerative grade 1 anterolisthesis at L5-S1 Vertebrae: Negative for fracture, bone lesion, or erosion. Paraspinal and other soft tissues: Mild presacral stranding. There is atherosclerosis and bilateral nephrolithiasis. Disc levels: Good disc height preservation for age. Degenerative facet spurring that is advanced at L5-S1 where there is also  anterolisthesis. IMPRESSION: CT THORACIC SPINE IMPRESSION 1. No acute finding. 2. Usual degenerative changes. CT LUMBAR SPINE IMPRESSION 1. No evidence of lumbar spine fracture. 2. Presacral fat stranding without visible fracture of the covered sacrum. Electronically Signed   By: Monte Fantasia M.D.   On: 03/12/2018 09:53   Dg Pelvis Comp Min 3v  Result Date: 03/11/2018 CLINICAL DATA:  Recent fall with hip pain, initial encounter EXAM: JUDET PELVIS - 3+ VIEW COMPARISON:  03/03/2018 FINDINGS: Pelvic ring is intact. Postsurgical changes are noted in the region of right groin. No acute fracture or dislocation is noted. No soft tissue abnormality is seen. IMPRESSION: No acute abnormality noted. Electronically Signed   By: Inez Catalina M.D.   On: 03/11/2018 15:23   Labs: BMET Recent Labs  Lab 03/11/18 1323 03/11/18 1912 03/11/18 2258 03/12/18 0430 03/13/18 0356  NA 137  --  134* 137 140  K 3.2*  --  2.9* 3.2* 3.3*  CL 96*  --  98 102 107  CO2 31  --  '27 24 24  ' GLUCOSE 113*  --  116* 149* 70  BUN 33*  --  31* 29* 33*  CREATININE 1.98* 1.88* 1.84* 1.82* 2.01*  CALCIUM 14.1*  --  12.9*  13.1* 12.2* 10.9*  PHOS 3.1  --   --   --   --    CBC Recent Labs  Lab 03/11/18 1323 03/11/18 1912 03/13/18 0356  WBC 4.9 5.5 7.9  NEUTROABS 2.9  --   --   HGB 11.7* 12.0 9.8*  HCT 35.3* 35.3* 29.7*  MCV 95.1 93.6 96.7  PLT 186 184 190    Medications:    . acidophilus   Oral QPM  . amLODipine  5 mg Oral Daily  . enoxaparin (LOVENOX) injection  30 mg Subcutaneous Q24H  . famciclovir  250 mg Oral Daily  . lidocaine  1 patch Transdermal Q24H  . pantoprazole (PROTONIX) IV  40 mg Intravenous Q24H  . prednisoLONE acetate  1 drop Both Eyes QODAY  . vitamin B-12  1,000 mcg Oral Daily   Elmarie Shiley, MD 03/13/2018, 10:37 AM

## 2018-03-13 NOTE — Progress Notes (Signed)
Occupational Therapy Evaluation (late entry)  Pt admitted with the below listed diagnosis and deficits.  She currently requires max A +2 for functional transfers, and mod - total A for ADLs.  She lives with her spouse who was recently providing assist for her over the past 7-8 days PTA.  Prior to that pt and daughter report pt was fully independent.  Anticipate she will require increased level of assist than spouse can provide as he is in his 72's.  Recommend SNF.      03/12/18 2050  OT Visit Information  Last OT Received On 03/13/18  Assistance Needed +2  PT/OT/SLP Co-Evaluation/Treatment Yes  Reason for Co-Treatment Complexity of the patient's impairments (multi-system involvement);For patient/therapist safety;To address functional/ADL transfers;Necessary to address cognition/behavior during functional activity  History of Present Illness Pt is an 85 y/o female who presents to the ED s/p fall ~8 days PTA with AMS, fatigue, pain, and poor oral intake. CT scan negative for acute injuries (at [facility] immediately after fall, d/c home), however CT of spine here at [facility] revealed lucencies in cervical spine concerning for myeloma. In the ED pt was found to have AKI, hypercalcemia.   Precautions  Precautions Fall  Restrictions  Weight Bearing Restrictions No  Home Living  Family/patient expects to be discharged to: Private residence  Living Arrangements Spouse/significant other  Available Help at Discharge Family;Available 24 hours/day  Type of Home House  Home Access Stairs to enter  Entrance Stairs-Number of Steps 3  Home Layout One level  Engineer, water - 2 wheels;Walker - 4 wheels  Additional Comments lives with elderly spouse   Prior Function  Level of Independence Needs assistance  Gait / Transfers Assistance Needed Pt required assistance for ambulation from spouse with a week long deterioration in function   ADL's / Homemaking Assistance Needed Spouse was  assisting with ADLs at home   Comments Prior to 8-9 days ago pt was independent without an AD. After pt fell, function declined.   Communication  Communication No difficulties  Pain Assessment  Pain Assessment Faces  Faces Pain Scale 4  Pain Location Generalized pain in back/trunk during mobility  Pain Descriptors / Indicators Discomfort  Cognition  Arousal/Alertness Lethargic  Behavior During Therapy Flat affect  Overall Cognitive Status Impaired/Different from baseline  Area of Impairment Orientation;Following commands;Safety/judgement;Problem solving;Awareness  Orientation Level Disoriented to;Place  Following Commands Follows one step commands consistently;Follows one step commands with increased time  Safety/Judgement Decreased awareness of safety  Awareness Intellectual  Problem Solving Slow processing;Decreased initiation;Difficulty sequencing;Requires verbal cues;Requires tactile cues  Upper Extremity Assessment  Upper Extremity Assessment Generalized weakness  Lower Extremity Assessment  Lower Extremity Assessment Defer to PT evaluation  Cervical / Trunk Assessment  Cervical / Trunk Assessment Kyphotic  ADL  Overall ADL's  Needs assistance/impaired  Eating/Feeding Moderate assistance;Bed level  Grooming Wash/dry hands;Wash/dry face;Oral care;Brushing hair;Moderate assistance;Bed level  Upper Body Bathing Maximal assistance;Sitting;Bed level  Lower Body Bathing Maximal assistance;Sit to/from stand  Upper Body Dressing  Maximal assistance;Sitting  Lower Body Dressing Total assistance;Bed level;Sit to/from Corporate treasurer for physical assistance;+2 for safety/equipment;Stand-pivot;BSC  Toileting- Clothing Manipulation and Hygiene Total assistance;Sit to/from stand  Functional mobility during ADLs Maximal assistance;+2 for safety/equipment;+2 for physical assistance  General ADL Comments Pt fatigues quickly and demonstratres difficulty sustaining  attention to task consistently   Vision- History  Baseline Vision/History Wears glasses  Wears Glasses At all times  Bed Mobility  Overal bed mobility Needs Assistance  Bed Mobility Rolling;Sidelying to Sit;Sit to Supine  Rolling Mod assist;+2 for physical assistance  Sidelying to sit Min assist;+2 for physical assistance  Sit to supine Max assist;+2 for physical assistance  General bed mobility comments Hand over hand assist to reach for rails. Pt initiating movement towards EOB however required assist to complete.   Transfers  Overall transfer level Needs assistance  Equipment used 2 person hand held assist  Transfers Sit to/from BJ'sStand;Stand Pivot Transfers  Sit to Stand Max assist;+2 safety/equipment  Stand pivot transfers Max assist;+2 physical assistance;+2 safety/equipment  General transfer comment Heavy posterior lean and difficulty extending hips/knees into standing. Pt was able to take a few pivotal steps around to/from [facility] with +2 assist for balance support and safety.   Balance  Overall balance assessment Needs assistance  Sitting-balance support Feet supported;Bilateral upper extremity supported  Sitting balance-Leahy Scale Poor  Sitting balance - Comments Requires at least 1 UE support on bed to maintain sitting balance  Standing balance support Bilateral upper extremity supported;During functional activity  Standing balance-Leahy Scale Zero  Standing balance comment Max assist required  General Comments  General comments (skin integrity, edema, etc.) daughter and spouse present and instructed on pt's current functional level   OT - End of Session  Equipment Utilized During Treatment Gait belt  Activity Tolerance Patient limited by fatigue  Patient left in bed;with call bell/phone within reach;with family/visitor present  Nurse Communication Mobility status;Need for lift equipment  OT Assessment  OT Recommendation/Assessment Patient needs continued OT Services  OT  Visit Diagnosis Unsteadiness on feet (R26.81);Muscle weakness (generalized) (M62.81)  OT Problem List Decreased strength;Decreased activity tolerance;Impaired balance (sitting and/or standing);Decreased cognition;Decreased safety awareness;Decreased knowledge of use of DME or AE  Barriers to Discharge Decreased caregiver support  Barriers to Discharge Comments junsure if spouse can provide needed level of care   OT Plan  OT Frequency (ACUTE ONLY) Min 2X/week  OT Treatment/Interventions (ACUTE ONLY) Self-care/ADL training;DME and/or AE instruction;Therapeutic activities;Cognitive remediation/compensation;Patient/family education;Balance training  AM-PAC OT "6 Clicks" Daily Activity Outcome Measure (Version 2)  Help from another person eating meals? 2  Help from another person taking care of personal grooming? 2  Help from another person toileting, which includes using toliet, bedpan, or urinal? 2  Help from another person bathing (including washing, rinsing, drying)? 2  Help from another person to put on and taking off regular upper body clothing? 2  Help from another person to put on and taking off regular lower body clothing? 1  6 Click Score 11  OT Recommendation  Follow Up Recommendations SNF;Supervision/Assistance - 24 hour  OT Equipment 3 in 1 bedside commode;Tub/shower bench  Individuals Consulted  Consulted and Agree with Results and Recommendations Patient;Family member/caregiver  Family Member Consulted spouse and daughter   Acute Rehab OT Goals  Patient Stated Goal family's goal is pt will increase strength and return to normal   OT Goal Formulation With patient/family  Time For Goal Achievement 03/27/18  Potential to Achieve Goals Good  OT Time Calculation  OT Start Time (ACUTE ONLY) 1425  OT Stop Time (ACUTE ONLY) 1458  OT Time Calculation (min) 33 min  OT General Charges  $OT Visit 1 Visit  OT Evaluation  $OT Eval Moderate Complexity 1 Mod  Written Expression  Dominant  Hand Right  Jeani HawkingWendi Usher Hedberg, OTR/L Acute Rehabilitation Services Pager [phone] Office [phone]

## 2018-03-13 NOTE — Consult Note (Signed)
[facility] CM Primary Care Navigator  03/13/2018  Anne Bradshaw [birth year] [id number]   Met with patient, husband Anne Bradshaw) and daughter Anne Bradshaw) at the bedsideto identify possible discharge needs.  Daughter reports that patient was brought to the ER for evaluation of altered mental status, fatigue, poor oral intake since her fall few days back (discharged from [facility] ED) and she continued to have progressive weakness- unable to get up and walk which had led to this admission.  (altered mental status/ toxic metabolic encephalopathy, hypercalcemia, acute kidney injury, HTN, UTI)  Patient endorsesDr. Mayra Bradshaw with [facility] as her primary [place].   [place] to obtain medications without difficulty.     Per daughter, patient has been Geographical information systems officer medications athome with husband's assistance using their "own organizing system".  Patient has been driving prior to admission but husband will be providing transportation toher doctors' appointments after discharge.  Patientlives at home with husband who serves as her primary caregiver. Her daughter Anne Bradshaw- lives nearby) provides assistance whenever needed.   Anticipated discharge planisskilled nursing facility(SNF- in process) per therapy recommendation.  Patient and family expressedunderstandingto callherprimary care provider's officeonceshereturns backhomefor a post discharge follow-up within1- 2 weeksor sooner if needs arise.Patient letter (with PCP's contact number) was providedasareminder.   Explained to patient and family about [facility] CM services available for health managementand resources,buthadindicated having no pressing issuesor needsfor now except for starting to monitor and keeping a record of blood pressure readings to bring to provider on her follow-up visit. Husband has been assisting patient in  managing her health needs so far and closely following up with provider as well.   Patient and family expressed understanding to seekreferral to [facility] care management from primary care provider if deemed necessary and appropriateforanyservicesinthenearfuture- when patient returns to home from rehab facility.  [facility] care management information provided for future needs that patient may have.  Primary care provider's office is listed as providing transition of care (TOC) follow-up.    For additional questions please contact:  Anne Bradshaw, BSN, RN-BC [facility] PRIMARY CARE Navigator Cell: [phone]

## 2018-03-13 NOTE — [place] FL2 (Deleted)
[place] MEDICAID FL2 LEVEL OF CARE SCREENING TOOL     IDENTIFICATION  Patient Name: Anne Bradshaw Birthdate: 01/09/1928 Sex: female Admission Date (Current Location): 03/11/2018  [facility] and [place] Number:  Producer, television/film/video and Address:  The [place], [address], [place], [place] [address]      Provider Number: [id number]  Attending Physician Name and Address:  Lanae Boast, MD  Relative Name and Phone Number:  Husband-Charlie- [phone]  Daughter-Donna- [phone]    Current Level of Care: Hospital Recommended Level of Care: Skilled Nursing Facility Prior Approval Number:    Date Approved/Denied:   PASRR Number: [id number]  Discharge Plan: SNF    Current Diagnoses: Patient Active Problem List   Diagnosis Date Noted  . Hypercalcemia 03/11/2018  . Essential hypertension 09/27/2014  . 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 Thin fluids)  AMBULATORY STATUS COMMUNICATION OF NEEDS Skin   Extensive Assist(Patient unable to ambulate with PT during evaluation) Verbally Normal                       Personal Care Assistance Level of Assistance  Bathing, Feeding, Dressing Bathing Assistance: Maximum assistance Feeding assistance: Limited assistance(Mod assist) Dressing Assistance: Maximum assistance     Functional Limitations Info  Sight, Hearing, Speech Sight Info: Impaired Hearing Info: Adequate Speech Info: Adequate    SPECIAL CARE FACTORS FREQUENCY  PT (By licensed PT), OT (By licensed OT)     PT Frequency: PT evaluated 03/12/2018. PT at SNF evaluate and treat OT Frequency: OT evaluated 03/13/2018. OT at SNF evaluate and treat            Contractures  Contractures Info: Not present    Additional Factors Info  Code Status, Allergies Code Status Info: DNR Allergies Info: No known allergies           Current Medications (03/13/2018):  This is the current hospital active medication list Current Facility-Administered Medications  Medication Dose Route Frequency Provider Last Rate Last Dose  . 0.9 %  sodium chloride infusion   Intravenous Continuous Zetta Bills, MD 100 mL/hr at 03/13/18 1056    . acidophilus (RISAQUAD) capsule   Oral QPM Alessandra Bevels, MD   1 capsule at 03/12/18 2044  . amLODipine (NORVASC) tablet 5 mg  5 mg Oral Daily Alessandra Bevels, MD   5 mg at 03/13/18 1043  . cefTRIAXone (ROCEPHIN) 1 g in sodium chloride 0.9 % 100 mL IVPB  1 g Intravenous Q24H Alessandra Bevels, MD 200 mL/hr at 03/12/18 2039 1 g at 03/12/18 2039  . enoxaparin (LOVENOX) injection 30 mg  30 mg Subcutaneous Q24H Alessandra Bevels, MD   30 mg at 03/12/18 2044  . famciclovir [facility]) tablet 250 mg  250 mg Oral Daily Kc, Ramesh, MD   250 mg at 03/13/18 1041  . fentaNYL (SUBLIMAZE) injection 12.5 mcg  12.5 mcg Intravenous Q6H PRN Alessandra Bevels, MD   12.5 mcg at 03/12/18 1256  . hydrALAZINE (APRESOLINE) injection 10 mg  10 mg Intravenous Q6H PRN Bodenheimer, Charles A, NP   10 mg at 03/12/18 1014  . lidocaine (LIDODERM) 5 % 1 patch  1 patch Transdermal Q24H Alessandra Bevels, MD   1 patch at  03/12/18 1803  . ondansetron (ZOFRAN) injection 4 mg  4 mg Intravenous Q6H PRN Bodenheimer, Charles A, NP   4 mg at 03/12/18 1244  . pantoprazole (PROTONIX) injection 40 mg  40 mg Intravenous Q24H Kc, Ramesh, MD   40 mg at 03/13/18 1043  . polyvinyl alcohol (LIQUIFILM TEARS) 1.4 % ophthalmic solution 1 drop  1 drop Both Eyes PRN Kamineni, Neelima, MD      . prednisoLONE acetate (PRED FORTE) 1 % ophthalmic suspension 1 drop  1 drop Both Eyes QODAY Alessandra BevelsKamineni, Neelima, MD   1 drop at 03/12/18 1014  . traMADol (ULTRAM) tablet 50 mg  50 mg Oral Q12H PRN Alessandra BevelsKamineni, Neelima,  MD   50 mg at 03/12/18 1057  . vitamin B-12 (CYANOCOBALAMIN) tablet 1,000 mcg  1,000 mcg Oral Daily Alessandra BevelsKamineni, Neelima, MD   1,000 mcg at 03/13/18 1042     Discharge Medications: Please see discharge summary for a list of discharge medications.  Relevant Imaging Results:  Relevant Lab Results:   Additional Information SS# [id number]  Llana AlimentSydney B Tarren Sabree, Student-Social Work [phone]

## 2018-03-13 NOTE — Progress Notes (Signed)
PROGRESS NOTE    Anne Bradshaw  ZOX:[id number] DOB: Aug 05, 1927 DOA: 03/11/2018 PCP: Mayra Neer, MD   Brief Narrative: 85 year old female with history except for GERD, hypertension on HCTZ, macular degeneration brought to the ER for evaluation of altered mental status, fatigue, poor oral intake since her fall 8 days back where at [facility]  ED CT scan showed no acute injuries, was discharged home however continued to have severe muscle aches and pain brought to the ER  In the ER, found to have AKI, hypercalcemia, CT of the c-spine showed lucency indicating myeloma, CT head was negative blood pressure was in 811B systolic.  Patient was admitted for further management and nephrology was consulted. Patient mental status overall improving with current management with improving hypercalcemia   Subjective: Seen and examined this morning, appears much more alert awake and oriented.  Daughter thinks she is 85% better  Assessment & Plan:  Acute metabolic encephalopathy from hypercalcemia, severe dehydration.Continue supportive care, iv hydration.  Appears alert awake but somewhat confused. Cont supportive care, IV hydration.  Hypercalcemia:14.1-->12.2->10.9.? Etiology, work-up in progress.  PTH appropriately suppressed, vitamin 25 OH Vit D stable. Imaging study shows lytic lesions versus osteopenia in C spine. Has mild albuminemia but no anemia . S/p pamidronate 60 mg iv and calcitonin, and on first diuresis with saline hydration and calcium improving.  PTH RP, 1,25 vitamin D, serum protein electrophoresis and urine protein electrophoresis pending. Vitamin D/HCTZ on hold.  Appreciate nephrology on board.  AKI : Secondary to hypercalcemia/dehydration.  Creatinine remains up, plan is to continue IV fluids as per nephrology. Recent Labs  Lab 03/11/18 1323 03/11/18 1912 03/11/18 2258 03/12/18 0430 03/13/18 0356  CREATININE 1.98* 1.88* 1.84* 1.82* 2.01*   Hypertension: Blood pressure likely  high from IV fluids.  Continue on amlodipine, low-dose hydralazine.  Stopped HCTZ.   Hypokalemia: replete and recheck  Proteus UTI :On ceftriaxone,cont  to complete the course. Urine culture 50,000 colonies  Epigastric pain/?  Chest pain, with vomiting: but also with generalized pain.  EKG no overt signs of ischemia, troponin was less than 0.03, one reading 0.03-I suspect this is due to her renal failure.  Patient denies any chest pain today and has no recollection of chest pain yesterday.    Back pain: No acute finding on CT thoracic and lumbar spine.Continue pain control.  DVT prophylaxis: LOVENOX Code Status: DNR Family Communication: Updated husband and daughter yesterday and also daughter at the bedside today  Disposition Plan:Remains inpatient pending clinical improvement. cont PT OT Palliative care consulted for goals of care.   Consultants:  Nephrology  Procedures: none 03/11/18   CT C spine  1. No acute intracranial pathology. 2.  No acute osseous injury of the cervical spine. 3. Cervical spine spondylosis as described above. 4. Multiple small lucent lesions in the cervical spine vertebral bodies and posterior elements which may be secondary to osteopenia, but a similar appearance can be seen in the setting of multiple myeloma. Correlate with laboratory values.  03/12/18 CT THORACIC SPINE IMPRESSION  1. No acute finding. 2. Usual degenerative changes.  CT LUMBAR SPINE IMPRESSION  1. No evidence of lumbar spine fracture. 2. Presacral fat stranding without visible fracture of the covered Sacrum.  Antimicrobials: Anti-infectives (From admission, onward)   Start     Dose/Rate Route Frequency Ordered Stop   03/13/18 1000  famciclovir [facility]) tablet 250 mg     250 mg Oral Daily 03/13/18 0638     03/12/18 1000  famciclovir [facility]) tablet  500 mg  Status:  Discontinued     500 mg Oral Daily 03/11/18 1705 03/13/18 0638   03/11/18 2000  cefTRIAXone (ROCEPHIN) 1 g  in sodium chloride 0.9 % 100 mL IVPB     1 g 200 mL/hr over 30 Minutes Intravenous Every 24 hours 03/11/18 1904         Objective: Vitals:   03/12/18 1718 03/12/18 2000 03/13/18 0336 03/13/18 0800  BP: (!) 160/71 (!) 141/54 (!) 149/69 (!) 146/77  Pulse: 79 74 72 71  Resp: _0 Temp: 97.6 F (36.4 C) (!) 97.4 F (36.3 C) 98.6 F (37 C) 98.4 F (36.9 C)  TempSrc: Oral Oral Oral Oral  SpO2: 98% 95% 93% 91%  Weight:   54.9 kg   Height:        Intake/Output Summary (Last 24 hours) at 03/13/2018 1259 Last data filed at 03/13/2018 0800 Gross per 24 hour  Intake 1560 ml  Output 1350 ml  Net 210 ml   Filed Weights   03/11/18 1206 03/11/18 1936 03/13/18 0336  Weight: 49.4 kg 56.7 kg 54.9 kg   Weight change: 5.53 kg  Body mass index is 20.79 kg/m.  Intake/Output from previous day: 01/20 0701 - 01/21 0700 In: 1680 [P.O.:380; I.V.:1200; IV Piggyback:100] Out: 1150 [Urine:1150] Intake/Output this shift: Total I/O In: -  Out: 600 [Urine:600]  Examination:  General exam:Calm,comfortable, elderly,frail,older for age,average built.  HEENT:Oral mucosa DRY+,Ear/Nose WNL grossly,dentition normal. Respiratory system:Bilateral equal air entry, no crackles and wheezing,no use of accessory muscle, non tender on palpation. Cardiovascular system: regular rate and rhythm, S1 & S2 heard, No JVD/murmurs. Gastrointestinal system:Abdomen soft, non-tender, non-distended, BS +.No hepatosplenomegaly palpable. Nervous System:Alert, awake and oriented at baseline. Able to move UE and LE, sensation intact. Extremities: No edema, distal peripheral pulses palpable.  Skin:No rashes,no icterus. MBW:GYKZLD muscle bulk,tone, power.  Medications:  Scheduled Meds: . acidophilus   Oral QPM  . amLODipine  5 mg Oral Daily  . enoxaparin (LOVENOX) injection  30 mg Subcutaneous Q24H  . famciclovir  250 mg Oral Daily  . lidocaine  1 patch Transdermal Q24H  . pantoprazole (PROTONIX) IV  40 mg  Intravenous Q24H  . prednisoLONE acetate  1 drop Both Eyes QODAY  . vitamin B-12  1,000 mcg Oral Daily   Continuous Infusions: . sodium chloride 100 mL/hr at 03/13/18 1056  . cefTRIAXone (ROCEPHIN)  IV 1 g (03/12/18 2039)    Data Reviewed: I have personally reviewed following labs and imaging studies  CBC: Recent Labs  Lab 03/11/18 1323 03/11/18 1912 03/13/18 0356  WBC 4.9 5.5 7.9  NEUTROABS 2.9  --   --   HGB 11.7* 12.0 9.8*  HCT 35.3* 35.3* 29.7*  MCV 95.1 93.6 96.7  PLT 186 184 357   Basic Metabolic Panel: Recent Labs  Lab 03/11/18 1323 03/11/18 1912 03/11/18 2258 03/12/18 0430 03/13/18 0356  NA 137  --  134* 137 140  K 3.2*  --  2.9* 3.2* 3.3*  CL 96*  --  98 102 107  CO2 31  --  _1 GLUCOSE 113*  --  116* 149* 70  BUN 33*  --  31* 29* 33*  CREATININE 1.98* 1.88* 1.84* 1.82* 2.01*  CALCIUM 14.1*  --  12.9*  13.1* 12.2* 10.9*  MG 1.9  --   --   --   --   PHOS 3.1  --   --   --   --    GFR:  Estimated Creatinine Clearance: 16.7 mL/min (A) (by C-G formula based on SCr of 2.01 mg/dL (H)). Liver Function Tests: Recent Labs  Lab 03/11/18 1323 03/12/18 0430  AST 53* 47*  ALT 36 33  ALKPHOS 61 54  BILITOT 0.8 0.7  PROT 7.5 7.2  ALBUMIN 3.6 3.2*   No results for input(s): LIPASE, AMYLASE in the last 168 hours. No results for input(s): AMMONIA in the last 168 hours. Coagulation Profile: No results for input(s): INR, PROTIME in the last 168 hours. Cardiac Enzymes: Recent Labs  Lab 03/12/18 1311 03/12/18 1931 03/13/18 0025  TROPONINI <0.03 <0.03 0.03*   BNP (last 3 results) No results for input(s): PROBNP in the last 8760 hours. HbA1C: No results for input(s): HGBA1C in the last 72 hours. CBG: No results for input(s): GLUCAP in the last 168 hours. Lipid Profile: No results for input(s): CHOL, HDL, LDLCALC, TRIG, CHOLHDL, LDLDIRECT in the last 72 hours. Thyroid Function Tests: No results for input(s): TSH, T4TOTAL, FREET4, T3FREE, THYROIDAB  in the last 72 hours. Anemia Panel: No results for input(s): VITAMINB12, FOLATE, FERRITIN, TIBC, IRON, RETICCTPCT in the last 72 hours. Sepsis Labs: No results for input(s): PROCALCITON, LATICACIDVEN in the last 168 hours.  Recent Results (from the past 240 hour(s))  Urine culture     Status: Abnormal   Collection Time: 03/11/18  2:26 PM  Result Value Ref Range Status   Specimen Description URINE, CLEAN CATCH  Final   Special Requests   Final    NONE Performed at [facility], [address], [place], [place] [address]    Culture 10,000 COLONIES/mL PROTEUS MIRABILIS (A)  Final   Report Status 03/13/2018 FINAL  Final   Organism ID, Bacteria PROTEUS MIRABILIS (A)  Final      Susceptibility   Proteus mirabilis - MIC*    AMPICILLIN <=2 SENSITIVE Sensitive     CEFAZOLIN <=4 SENSITIVE Sensitive     CEFTRIAXONE <=1 SENSITIVE Sensitive     CIPROFLOXACIN <=0.25 SENSITIVE Sensitive     GENTAMICIN <=1 SENSITIVE Sensitive     IMIPENEM 4 SENSITIVE Sensitive     NITROFURANTOIN 128 RESISTANT Resistant     TRIMETH/SULFA <=20 SENSITIVE Sensitive     AMPICILLIN/SULBACTAM <=2 SENSITIVE Sensitive     PIP/TAZO <=4 SENSITIVE Sensitive     * 10,000 COLONIES/mL PROTEUS MIRABILIS  Culture, Urine     Status: Abnormal (Preliminary result)   Collection Time: 03/11/18  7:04 PM  Result Value Ref Range Status   Specimen Description URINE, CLEAN CATCH  Final   Special Requests   Final    NONE Performed at [facility], [place] [address] [place], [place] [address]    Culture 50,000 COLONIES/mL PROTEUS MIRABILIS (A)  Final   Report Status PENDING  Incomplete      Radiology Studies: Dg Chest 2 View  Result Date: 03/11/2018 CLINICAL DATA:  Recent fall, initial encounter EXAM: CHEST - 2 VIEW COMPARISON:  12/11/2014 FINDINGS: Cardiac shadows within normal limits. Aortic calcifications are noted. Lungs are hyperinflated without focal infiltrate or sizable effusion. No acute bony abnormality is  noted. IMPRESSION: No acute abnormality noted. Electronically Signed   By: Inez Catalina M.D.   On: 03/11/2018 15:18   Ct Head Wo Contrast  Result Date: 03/11/2018 CLINICAL DATA:  Altered level of consciousness, confusion EXAM: CT HEAD WITHOUT CONTRAST CT CERVICAL SPINE WITHOUT CONTRAST TECHNIQUE: Multidetector CT imaging of the head and cervical spine was performed following the standard protocol without intravenous contrast. Multiplanar CT  image reconstructions of the cervical spine were also generated. COMPARISON:  None. FINDINGS: CT HEAD FINDINGS Brain: No evidence of acute infarction, hemorrhage, extra-axial collection, ventriculomegaly, or mass effect. Generalized cerebral atrophy. Periventricular white matter low attenuation likely secondary to microangiopathy. Vascular: Cerebrovascular atherosclerotic calcifications are noted. Skull: Negative for fracture or focal lesion. Sinuses/Orbits: Visualized portions of the orbits are unremarkable. Visualized portions of the paranasal sinuses and mastoid air cells are unremarkable. Other: None. CT CERVICAL SPINE FINDINGS Alignment: Normal. Skull base and vertebrae: No acute fracture. Multiple small lucent lesions in the cervical spine vertebral bodies and posterior elements . Soft tissues and spinal canal: No prevertebral fluid or swelling. No visible canal hematoma. Disc levels: Degenerative disc disease with disc height loss at C4-5 and C5-6. Broad-based disc osteophyte complexes at C4-5 and C5-6 with bilateral uncovertebral degenerative changes. Moderate right facet arthropathy at C2-3. Moderate right and mild left facet arthropathy at C3-4. Moderate bilateral facet arthropathy at C4-5 with mild bilateral foraminal narrowing. Mild bilateral facet arthropathy and bilateral uncovertebral degenerative changes at C5-6 with foraminal stenosis. Upper chest: Biapical fibrosis Other: No fluid collection or hematoma. IMPRESSION: 1. No acute intracranial pathology. 2.  No  acute osseous injury of the cervical spine. 3. Cervical spine spondylosis as described above. 4. Multiple small lucent lesions in the cervical spine vertebral bodies and posterior elements which may be secondary to osteopenia, but a similar appearance can be seen in the setting of multiple myeloma. Correlate with laboratory values. Electronically Signed   By: Kathreen Devoid   On: 03/11/2018 13:40   Ct Cervical Spine Wo Contrast  Result Date: 03/11/2018 CLINICAL DATA:  Altered level of consciousness, confusion EXAM: CT HEAD WITHOUT CONTRAST CT CERVICAL SPINE WITHOUT CONTRAST TECHNIQUE: Multidetector CT imaging of the head and cervical spine was performed following the standard protocol without intravenous contrast. Multiplanar CT image reconstructions of the cervical spine were also generated. COMPARISON:  None. FINDINGS: CT HEAD FINDINGS Brain: No evidence of acute infarction, hemorrhage, extra-axial collection, ventriculomegaly, or mass effect. Generalized cerebral atrophy. Periventricular white matter low attenuation likely secondary to microangiopathy. Vascular: Cerebrovascular atherosclerotic calcifications are noted. Skull: Negative for fracture or focal lesion. Sinuses/Orbits: Visualized portions of the orbits are unremarkable. Visualized portions of the paranasal sinuses and mastoid air cells are unremarkable. Other: None. CT CERVICAL SPINE FINDINGS Alignment: Normal. Skull base and vertebrae: No acute fracture. Multiple small lucent lesions in the cervical spine vertebral bodies and posterior elements . Soft tissues and spinal canal: No prevertebral fluid or swelling. No visible canal hematoma. Disc levels: Degenerative disc disease with disc height loss at C4-5 and C5-6. Broad-based disc osteophyte complexes at C4-5 and C5-6 with bilateral uncovertebral degenerative changes. Moderate right facet arthropathy at C2-3. Moderate right and mild left facet arthropathy at C3-4. Moderate bilateral facet  arthropathy at C4-5 with mild bilateral foraminal narrowing. Mild bilateral facet arthropathy and bilateral uncovertebral degenerative changes at C5-6 with foraminal stenosis. Upper chest: Biapical fibrosis Other: No fluid collection or hematoma. IMPRESSION: 1. No acute intracranial pathology. 2.  No acute osseous injury of the cervical spine. 3. Cervical spine spondylosis as described above. 4. Multiple small lucent lesions in the cervical spine vertebral bodies and posterior elements which may be secondary to osteopenia, but a similar appearance can be seen in the setting of multiple myeloma. Correlate with laboratory values. Electronically Signed   By: Kathreen Devoid   On: 03/11/2018 13:40   Ct Thoracic Spine Wo Contrast  Result Date: 03/12/2018 CLINICAL DATA:  Back pain  with minor trauma EXAM: CT THORACIC AND LUMBAR SPINE WITHOUT CONTRAST TECHNIQUE: Multidetector CT imaging of the thoracic and lumbar spine was performed without contrast. Multiplanar CT image reconstructions were also generated. COMPARISON:  Abdominal CT 07/17/2014 FINDINGS: CT THORACIC SPINE FINDINGS Alignment: No traumatic malalignment. Vertebrae: Negative for fracture Paraspinal and other soft tissues: Reticulonodular opacities in the bilateral apical lungs, a similar pattern was seen in 2016 and this is likely chronic indolent infection or post infectious scarring. No evident hematoma or swelling. Proximal gastric diverticulum Disc levels: Midthoracic disc narrowing and endplate degeneration. Unremarkable facets. No evident impingement CT LUMBAR SPINE FINDINGS Segmentation: 5 lumbar type vertebral bodies Alignment: These no traumatic malalignment. Degenerative grade 1 anterolisthesis at L5-S1 Vertebrae: Negative for fracture, bone lesion, or erosion. Paraspinal and other soft tissues: Mild presacral stranding. There is atherosclerosis and bilateral nephrolithiasis. Disc levels: Good disc height preservation for age. Degenerative facet  spurring that is advanced at L5-S1 where there is also anterolisthesis. IMPRESSION: CT THORACIC SPINE IMPRESSION 1. No acute finding. 2. Usual degenerative changes. CT LUMBAR SPINE IMPRESSION 1. No evidence of lumbar spine fracture. 2. Presacral fat stranding without visible fracture of the covered sacrum. Electronically Signed   By: Monte Fantasia M.D.   On: 03/12/2018 09:53   Ct Lumbar Spine Wo Contrast  Result Date: 03/12/2018 CLINICAL DATA:  Back pain with minor trauma EXAM: CT THORACIC AND LUMBAR SPINE WITHOUT CONTRAST TECHNIQUE: Multidetector CT imaging of the thoracic and lumbar spine was performed without contrast. Multiplanar CT image reconstructions were also generated. COMPARISON:  Abdominal CT 07/17/2014 FINDINGS: CT THORACIC SPINE FINDINGS Alignment: No traumatic malalignment. Vertebrae: Negative for fracture Paraspinal and other soft tissues: Reticulonodular opacities in the bilateral apical lungs, a similar pattern was seen in 2016 and this is likely chronic indolent infection or post infectious scarring. No evident hematoma or swelling. Proximal gastric diverticulum Disc levels: Midthoracic disc narrowing and endplate degeneration. Unremarkable facets. No evident impingement CT LUMBAR SPINE FINDINGS Segmentation: 5 lumbar type vertebral bodies Alignment: These no traumatic malalignment. Degenerative grade 1 anterolisthesis at L5-S1 Vertebrae: Negative for fracture, bone lesion, or erosion. Paraspinal and other soft tissues: Mild presacral stranding. There is atherosclerosis and bilateral nephrolithiasis. Disc levels: Good disc height preservation for age. Degenerative facet spurring that is advanced at L5-S1 where there is also anterolisthesis. IMPRESSION: CT THORACIC SPINE IMPRESSION 1. No acute finding. 2. Usual degenerative changes. CT LUMBAR SPINE IMPRESSION 1. No evidence of lumbar spine fracture. 2. Presacral fat stranding without visible fracture of the covered sacrum. Electronically  Signed   By: Monte Fantasia M.D.   On: 03/12/2018 09:53   Dg Pelvis Comp Min 3v  Result Date: 03/11/2018 CLINICAL DATA:  Recent fall with hip pain, initial encounter EXAM: JUDET PELVIS - 3+ VIEW COMPARISON:  03/03/2018 FINDINGS: Pelvic ring is intact. Postsurgical changes are noted in the region of right groin. No acute fracture or dislocation is noted. No soft tissue abnormality is seen. IMPRESSION: No acute abnormality noted. Electronically Signed   By: Inez Catalina M.D.   On: 03/11/2018 15:23      LOS: 2 days   Time spent: More than 50% of that time was spent in counseling and/or coordination of care.  Antonieta Pert, MD [facility]  03/13/2018, 12:59 PM

## 2018-03-13 NOTE — Clinical Social Work Note (Signed)
Clinical Social Work Assessment  Patient Details  Name: Anne Bradshaw MRN: [id number] Date of Birth: 06-Apr-1927  Date of referral:  03/13/18               Reason for consult:  Facility Placement, Discharge Planning                Permission sought to share information with:  Family Supports Permission granted to share information::  Yes, Verbal Permission Granted  Name::     Anne, Bradshaw  Agency::     Relationship::  Husband, Daughter  Contact Information:  [phone], [phone]  Housing/Transportation Living arrangements for the past 2 months:  Single Family Home Source of Information:  Adult Children, Patient, Spouse Patient Interpreter Needed:  None Criminal Activity/Legal Involvement Pertinent to Current Situation/Hospitalization:  No - Comment as needed Significant Relationships:  Spouse, Adult Children Lives with:  Spouse Do you feel safe going back to the place where you live?  No Need for family participation in patient care:  Yes (Comment)  Care giving concerns:  Patient stated that care giving at home would be too hard on her husband. Patient's husband is in his 46's and their two daughters are unable to care for the patient. Daughter Anne Bradshaw lives in [place]. Their other daughter, Anne Bradshaw has a daughter with Anne Bradshaw syndrome, which compromises her immune system.    Social Worker assessment / plan:  SW intern informed patient and family that SNF was recommended. Patient and family members agree that this would be the best option based on the ability of the husband and children to care for the patient. SNF search process explained. Anne Bradshaw was given a skilled nursing facility list. [name] is complete and SW will follow up with bed offers.   Employment status:  Retired Health and safety inspector:  [employer] PT Recommendations:  Skilled Nursing Facility Information / Referral to community resources:  Skilled Nursing Facility(Provided husband with  skilled facility list)  Patient/Family's Response to care: Patient and family in agreement to SNF  Patient/Family's Understanding of and Emotional Response to Diagnosis, Current Treatment, and Prognosis:  Patient and family understand the need of SNF and wants the patient to receive the help that she needs.   Emotional Assessment Appearance:  Appears stated age Attitude/Demeanor/Rapport:  Other(appropriate) Affect (typically observed):  Accepting, Pleasant, Appropriate, Calm Orientation:  Oriented to Self, Oriented to Place, Oriented to Situation Alcohol / Substance use:  Never Used Psych involvement (Current and /or in the community):  No (Comment)  Discharge Needs  Concerns to be addressed:  Discharge Planning Concerns Readmission within the last 30 days:  No Current discharge risk:  None Barriers to Discharge:  Continued Medical Work up, [name], Student-Social Work 03/13/2018, 2:11 PM

## 2018-03-13 NOTE — NC FL2 (Signed)
[place] MEDICAID FL2 LEVEL OF CARE SCREENING TOOL     IDENTIFICATION  Patient Name: Anne Bradshaw Birthdate: 09-24-1927 Sex: female Admission Date (Current Location): 03/11/2018  [facility] and [place] Number:  Producer, television/film/video and Address:  [facility] [place], [address], [place], [place] [address]      Provider Number: [id number]  Attending Physician Name and Address:  Lanae Boast, MD  Relative Name and Phone Number:  Husband-Charlie- [phone] Daughter-Donna- [phone]    Current Level of Care: Hospital Recommended Level of Care: Skilled Nursing Facility Prior Approval Number:    Date Approved/Denied:   PASRR Number: [id number]  Discharge Plan: SNF    Current Diagnoses: Patient Active Problem List   Diagnosis Date Noted  . Hypercalcemia 03/11/2018  . Essential hypertension 09/27/2014  . 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    Orientation RESPIRATION BLADDER Height & Weight     Self, Situation, Place  Normal Incontinent Weight: 121 lb 1.6 oz (54.9 kg) Height:  5\' 4"  (162.6 cm)  BEHAVIORAL SYMPTOMS/MOOD NEUROLOGICAL BOWEL NUTRITION STATUS      Continent Diet(Full liquid diet - thin fluids)  AMBULATORY STATUS COMMUNICATION OF NEEDS Skin   Extensive Assist(PT unable to ambulate at time of evaluation) Verbally Normal                       Personal Care Assistance Level of Assistance  Bathing, Feeding, Dressing Bathing Assistance: Maximum assistance Feeding assistance: Limited assistance(Mod Assist) Dressing Assistance: Maximum assistance     Functional Limitations Info  Sight, Hearing, Speech Sight Info: Impaired Hearing Info: Adequate Speech Info: Adequate    SPECIAL CARE FACTORS FREQUENCY  PT (By licensed PT), OT (By licensed OT)     PT Frequency: PT evaluated 03/12/2018. PT at SNF evaluate and treat OT Frequency: OT evaluated 03/13/2018. OT at SNF evaluate and treat            Contractures Contractures  Info: Not present    Additional Factors Info  Code Status, Allergies Code Status Info: DNR Allergies Info: No known allergies           Current Medications (03/13/2018):  This is the current hospital active medication list Current Facility-Administered Medications  Medication Dose Route Frequency Provider Last Rate Last Dose  . 0.9 %  sodium chloride infusion   Intravenous Continuous Zetta Bills, MD 100 mL/hr at 03/13/18 1056    . acidophilus (RISAQUAD) capsule   Oral QPM Alessandra Bevels, MD   1 capsule at 03/12/18 2044  . amLODipine (NORVASC) tablet 5 mg  5 mg Oral Daily Alessandra Bevels, MD   5 mg at 03/13/18 1043  . cefTRIAXone (ROCEPHIN) 1 g in sodium chloride 0.9 % 100 mL IVPB  1 g Intravenous Q24H Alessandra Bevels, MD 200 mL/hr at 03/12/18 2039 1 g at 03/12/18 2039  . enoxaparin (LOVENOX) injection 30 mg  30 mg Subcutaneous Q24H Alessandra Bevels, MD   30 mg at 03/12/18 2044  . famciclovir [facility]) tablet 250 mg  250 mg Oral Daily Kc, Ramesh, MD   250 mg at 03/13/18 1041  . fentaNYL (SUBLIMAZE) injection 12.5 mcg  12.5 mcg Intravenous Q6H PRN Alessandra Bevels, MD   12.5 mcg at 03/12/18 1256  . hydrALAZINE (APRESOLINE) injection 10 mg  10 mg Intravenous Q6H PRN Bodenheimer, Charles A, NP   10 mg at 03/12/18 1014  . lidocaine (LIDODERM) 5 % 1 patch  1 patch Transdermal Q24H Alessandra Bevels, MD   1 patch at  03/12/18 1803  . ondansetron (ZOFRAN) injection 4 mg  4 mg Intravenous Q6H PRN Bodenheimer, Charles A, NP   4 mg at 03/12/18 1244  . pantoprazole (PROTONIX) injection 40 mg  40 mg Intravenous Q24H Kc, Ramesh, MD   40 mg at 03/13/18 1043  . polyvinyl alcohol (LIQUIFILM TEARS) 1.4 % ophthalmic solution 1 drop  1 drop Both Eyes PRN Kamineni, Neelima, MD      . prednisoLONE acetate (PRED FORTE) 1 % ophthalmic suspension 1 drop  1 drop Both Eyes QODAY Alessandra Bevels, MD   1 drop at 03/12/18 1014  . traMADol (ULTRAM) tablet 50 mg  50 mg Oral Q12H PRN Alessandra Bevels, MD   50 mg  at 03/12/18 1057  . vitamin B-12 (CYANOCOBALAMIN) tablet 1,000 mcg  1,000 mcg Oral Daily Alessandra Bevels, MD   1,000 mcg at 03/13/18 1042     Discharge Medications: Please see discharge summary for a list of discharge medications.  Relevant Imaging Results:  Relevant Lab Results:   Additional Information SS# [id number]  Llana Aliment Work [phone]

## 2018-03-14 ENCOUNTER — Inpatient Hospital Stay (HOSPITAL_COMMUNITY): Payer: Medicare Other

## 2018-03-14 DIAGNOSIS — N179 Acute kidney failure, unspecified: Secondary | ICD-10-CM

## 2018-03-14 DIAGNOSIS — G9341 Metabolic encephalopathy: Secondary | ICD-10-CM

## 2018-03-14 DIAGNOSIS — E869 Volume depletion, unspecified: Secondary | ICD-10-CM

## 2018-03-14 LAB — BASIC METABOLIC PANEL
ANION GAP: 6 (ref 5–15)
BUN: 30 mg/dL — ABNORMAL HIGH (ref 8–23)
CALCIUM: 9.1 mg/dL (ref 8.9–10.3)
CO2: 19 mmol/L — ABNORMAL LOW (ref 22–32)
Chloride: 114 mmol/L — ABNORMAL HIGH (ref 98–111)
Creatinine, Ser: 1.64 mg/dL — ABNORMAL HIGH (ref 0.44–1.00)
GFR calc Af Amer: 32 mL/min — ABNORMAL LOW (ref >60)
GFR, EST NON AFRICAN AMERICAN: 28 mL/min — AB (ref >60)
Glucose, Bld: 85 mg/dL (ref 70–99)
Potassium: 2.2 mmol/L — CL (ref 3.5–5.1)
Sodium: 139 mmol/L (ref 135–145)

## 2018-03-14 LAB — RENAL FUNCTION PANEL
Albumin: 2.6 g/dL — ABNORMAL LOW (ref 3.5–5.0)
Anion gap: 10 (ref 5–15)
BUN: 30 mg/dL — ABNORMAL HIGH (ref 8–23)
CO2: 17 mmol/L — ABNORMAL LOW (ref 22–32)
Calcium: 9.6 mg/dL (ref 8.9–10.3)
Chloride: 109 mmol/L (ref 98–111)
Creatinine, Ser: 1.56 mg/dL — ABNORMAL HIGH (ref 0.44–1.00)
GFR calc Af Amer: 34 mL/min — ABNORMAL LOW (ref >60)
GFR calc non Af Amer: 29 mL/min — ABNORMAL LOW (ref >60)
Glucose, Bld: 104 mg/dL — ABNORMAL HIGH (ref 70–99)
Phosphorus: 1.6 mg/dL — ABNORMAL LOW (ref 2.5–4.6)
Potassium: 2.8 mmol/L — ABNORMAL LOW (ref 3.5–5.1)
Sodium: 136 mmol/L (ref 135–145)

## 2018-03-14 LAB — MAGNESIUM: Magnesium: 1.3 mg/dL — ABNORMAL LOW (ref 1.7–2.4)

## 2018-03-14 LAB — CBC
HCT: 28.1 % — ABNORMAL LOW (ref 36.0–46.0)
Hemoglobin: 9.4 g/dL — ABNORMAL LOW (ref 12.0–15.0)
MCH: 31.5 pg (ref 26.0–34.0)
MCHC: 33.5 g/dL (ref 30.0–36.0)
MCV: 94.3 fL (ref 80.0–100.0)
Platelets: 150 10*3/uL (ref 150–400)
RBC: 2.98 MIL/uL — ABNORMAL LOW (ref 3.87–5.11)
RDW: 13.1 % (ref 11.5–15.5)
WBC: 5.6 10*3/uL (ref 4.0–10.5)
nRBC: 0 % (ref 0.0–0.2)

## 2018-03-14 LAB — URINE CULTURE

## 2018-03-14 LAB — CALCITRIOL (1,25 DI-OH VIT D): Vit D, 1,25-Dihydroxy: 38.2 pg/mL (ref 19.9–79.3)

## 2018-03-14 MED ORDER — IPRATROPIUM-ALBUTEROL 0.5-2.5 (3) MG/3ML IN SOLN: 3.0000 mL | Freq: Four times a day (QID) | RESPIRATORY_TRACT | Status: DC | PRN

## 2018-03-14 MED ORDER — POTASSIUM CHLORIDE 10 MEQ/100ML IV SOLN
10.0000 meq | INTRAVENOUS | Status: AC
  Administered 2018-03-14 (×3): 10 meq via INTRAVENOUS
  Filled 2018-03-14 (×3): qty 100

## 2018-03-14 MED ORDER — WHITE PETROLATUM EX OINT
TOPICAL_OINTMENT | CUTANEOUS | Status: AC
  Administered 2018-03-14: 10:00:00
  Filled 2018-03-14: qty 28.35

## 2018-03-14 MED ORDER — POTASSIUM CHLORIDE CRYS ER 20 MEQ PO TBCR
40.0000 meq | EXTENDED_RELEASE_TABLET | Freq: Once | ORAL | Status: AC
  Administered 2018-03-14: 40 meq via ORAL
  Filled 2018-03-14: qty 2

## 2018-03-14 MED ORDER — POTASSIUM CHLORIDE IN NACL 20-0.9 MEQ/L-% IV SOLN
INTRAVENOUS | Status: DC
  Administered 2018-03-14 (×2): via INTRAVENOUS
  Filled 2018-03-14 (×2): qty 1000

## 2018-03-14 MED ORDER — FLUTICASONE FUROATE-VILANTEROL 100-25 MCG/INH IN AEPB
1.0000 | INHALATION_SPRAY | Freq: Every day | RESPIRATORY_TRACT | Status: DC
  Administered 2018-03-15 – 2018-03-16 (×2): 1 via RESPIRATORY_TRACT
  Filled 2018-03-14: qty 28

## 2018-03-14 MED ORDER — HYDROCODONE-ACETAMINOPHEN 5-325 MG PO TABS
1.0000 | ORAL_TABLET | Freq: Four times a day (QID) | ORAL | Status: DC | PRN
  Administered 2018-03-15: 1 via ORAL
  Filled 2018-03-14: qty 1

## 2018-03-14 MED ORDER — FAMCICLOVIR 500 MG PO TABS
500.0000 mg | ORAL_TABLET | Freq: Every day | ORAL | Status: DC
  Administered 2018-03-14 – 2018-03-16 (×3): 500 mg via ORAL
  Filled 2018-03-14 (×3): qty 1

## 2018-03-14 MED ORDER — ACETAMINOPHEN 325 MG PO TABS
650.0000 mg | ORAL_TABLET | Freq: Four times a day (QID) | ORAL | Status: DC | PRN
  Administered 2018-03-14: 650 mg via ORAL
  Filled 2018-03-14: qty 2

## 2018-03-14 MED ORDER — UMECLIDINIUM BROMIDE 62.5 MCG/INH IN AEPB
1.0000 | INHALATION_SPRAY | Freq: Every day | RESPIRATORY_TRACT | Status: DC
  Administered 2018-03-15 – 2018-03-16 (×2): 1 via RESPIRATORY_TRACT
  Filled 2018-03-14: qty 7

## 2018-03-14 NOTE — Progress Notes (Signed)
PROGRESS NOTE    Anne Bradshaw  TIR:[id number] DOB: [birth year] DOA: 03/11/2018 PCP: Mayra Neer, MD   Brief Narrative: 85 year old female with history except for GERD, hypertension on HCTZ, macular degeneration brought to the ER for evaluation of altered mental status, fatigue, poor oral intake since her fall 8 days back where at [facility] long  ED CT scan showed no acute injuries, was discharged home however continued to have severe muscle aches and pain brought to the ER  In the ER, found to have AKI, hypercalcemia, CT of the c-spine showed lucency indicating myeloma, CT head was negative blood pressure was in 676P systolic.  Patient was admitted for further management and nephrology was consulted. Patient mental status overall improving with current management with improving hypercalcemia   03/14/2018: Patient seen.  Calcium is down to 9.1.  Potassium is 2.2 today.  We will continue to replete potassium level.  Will check magnesium level.  IV fluids normal saline has been decreased to 50 cc an hour.  Will have a low threshold to add KCl to the normal saline as well.  Patient reported intermittent shortness of breath, but chest x-ray revealed COPD changes.  No evidence of multiple myeloma to date.  Subjective: Patient seen alongside patient's daughter and nurse.  Examined patient in the presence of the husband.  Patient reported intermittent shortness of breath.  Stat chest x-ray, BNP and repeat renal panel ordered.  Start patient on nebulizer treatment as well as other nebs.  Chest x-ray said to reveal COPD changes.  Assessment & Plan:  Acute metabolic encephalopathy: -Resolved significantly.  -Likely secondary to hypercalcemia and volume depletion.     Hypercalcemia: 14.1-->12.2->10.9-->9.1. Hypercalcemia has resolved. Possibly secondary to immobility, volume depletion and HCTZ Nephrology input is appreciated Work-up is in progress.  IV fluids is decreased to 50 cc/h today.    Follow PTH-related protein as well as 25-hydroxy vitamin D level.    AKI :  Possibly secondary to prerenal and ATN (rule of volume depletion and hypercalcemia).   Serum creatinine has continued to improve.   Continue to monitor closely.    Recent Labs  Lab 03/11/18 1912 03/11/18 2258 03/12/18 0430 03/13/18 0356 03/14/18 0419  CREATININE 1.88* 1.84* 1.82* 2.01* 1.64*   Hypertension:  Continue to optimize.   Continue current medication.   Hopefully, blood pressure will improve as the rate of normal saline has been decreased.   Further management will depend on hospital course.     Hypokalemia:  Continue to replete and monitor.   Check magnesium level.  Proteus UTI : Complete course of antibiotics.   Patient is on ceftriaxone.    Back pain: No acute finding on CT thoracic and lumbar spine.Continue pain control.  DVT prophylaxis: LOVENOX Code Status: DNR Family Communication: Daughter  disposition Plan:Remains inpatient pending clinical improvement. cont PT OT Palliative care consulted for goals of care.   Consultants:  Nephrology  Procedures: none 03/11/18   CT C spine  1. No acute intracranial pathology. 2.  No acute osseous injury of the cervical spine. 3. Cervical spine spondylosis as described above. 4. Multiple small lucent lesions in the cervical spine vertebral bodies and posterior elements which may be secondary to osteopenia, but a similar appearance can be seen in the setting of multiple myeloma. Correlate with laboratory values.  03/12/18 CT THORACIC SPINE IMPRESSION  1. No acute finding. 2. Usual degenerative changes.  CT LUMBAR SPINE IMPRESSION  1. No evidence of lumbar spine fracture. 2. Presacral fat stranding  without visible fracture of the covered Sacrum.  Antimicrobials: Anti-infectives (From admission, onward)   Start     Dose/Rate Route Frequency Ordered Stop   03/14/18 1000  famciclovir [facility]) tablet 500 mg     500 mg Oral  Daily 03/14/18 0844     03/13/18 1000  famciclovir [facility]) tablet 250 mg  Status:  Discontinued     250 mg Oral Daily 03/13/18 0638 03/14/18 0844   03/12/18 1000  famciclovir (FAMVIR) tablet 500 mg  Status:  Discontinued     500 mg Oral Daily 03/11/18 1705 03/13/18 0638   03/11/18 2000  cefTRIAXone (ROCEPHIN) 1 g in sodium chloride 0.9 % 100 mL IVPB     1 g 200 mL/hr over 30 Minutes Intravenous Every 24 hours 03/11/18 1904         Objective: Vitals:   03/13/18 1706 03/13/18 2133 03/14/18 0441 03/14/18 0811  BP: (!) 152/59 (!) 159/64 (!) 142/52 (!) 157/62  Pulse: 68 71 68 64  Resp: 18 (!) '21 17 18  ' Temp:  97.8 F (36.6 C) 98.4 F (36.9 C) 98.4 F (36.9 C)  TempSrc:  Oral  Oral  SpO2: 92% 94% 96% 95%  Weight:  54.8 kg    Height:        Intake/Output Summary (Last 24 hours) at 03/14/2018 1246 Last data filed at 03/14/2018 0900 Gross per 24 hour  Intake 760 ml  Output 2000 ml  Net -1240 ml   Filed Weights   03/11/18 1936 03/13/18 0336 03/13/18 2133  Weight: 56.7 kg 54.9 kg 54.8 kg   Weight change: -0.131 kg  Body mass index is 20.74 kg/m.  Intake/Output from previous day: 01/21 0701 - 01/22 0700 In: 640 [P.O.:640] Out: 2600 [Urine:2600] Intake/Output this shift: Total I/O In: 120 [P.O.:120] Out: -   Examination:  General exam:Calm,comfortable HEENT: Mild pallor.  No jaundice.   Respiratory system: Clear to auscultation anteriorly.  Cardiovascular system: S1-S2.   Gastrointestinal system:Abdomen soft, non-tender, non-distended, BS +.No hepatosplenomegaly palpable. Nervous System: Awake and alert.  Patient moves all limbs.   Extremities: No leg edema.   Medications:  Scheduled Meds: . acidophilus   Oral QPM  . amLODipine  5 mg Oral Daily  . enoxaparin (LOVENOX) injection  30 mg Subcutaneous Q24H  . famciclovir  500 mg Oral Daily  . fluticasone furoate-vilanterol  1 puff Inhalation Daily  . lidocaine  1 patch Transdermal Q24H  . pantoprazole (PROTONIX)  IV  40 mg Intravenous Q24H  . potassium chloride  40 mEq Oral Once  . prednisoLONE acetate  1 drop Both Eyes QODAY  . umeclidinium bromide  1 puff Inhalation Daily  . vitamin B-12  1,000 mcg Oral Daily   Continuous Infusions: . sodium chloride 50 mL/hr at 03/14/18 1007  . cefTRIAXone (ROCEPHIN)  IV 1 g (03/13/18 2046)    Data Reviewed: I have personally reviewed following labs and imaging studies  CBC: Recent Labs  Lab 03/11/18 1323 03/11/18 1912 03/13/18 0356 03/14/18 0419  WBC 4.9 5.5 7.9 5.6  NEUTROABS 2.9  --   --   --   HGB 11.7* 12.0 9.8* 9.4*  HCT 35.3* 35.3* 29.7* 28.1*  MCV 95.1 93.6 96.7 94.3  PLT 186 184 190 034   Basic Metabolic Panel: Recent Labs  Lab 03/11/18 1323 03/11/18 1912 03/11/18 2258 03/12/18 0430 03/13/18 0356 03/14/18 0419  NA 137  --  134* 137 140 139  K 3.2*  --  2.9* 3.2* 3.3* 2.2*  CL 96*  --  98 102 107 114*  CO2 31  --  '27 24 24 ' 19*  GLUCOSE 113*  --  116* 149* 70 85  BUN 33*  --  31* 29* 33* 30*  CREATININE 1.98* 1.88* 1.84* 1.82* 2.01* 1.64*  CALCIUM 14.1*  --  12.9*  13.1* 12.2* 10.9* 9.1  MG 1.9  --   --   --   --   --   PHOS 3.1  --   --   --   --   --    GFR: Estimated Creatinine Clearance: 20.5 mL/min (A) (by C-G formula based on SCr of 1.64 mg/dL (H)). Liver Function Tests: Recent Labs  Lab 03/11/18 1323 03/12/18 0430  AST 53* 47*  ALT 36 33  ALKPHOS 61 54  BILITOT 0.8 0.7  PROT 7.5 7.2  ALBUMIN 3.6 3.2*   No results for input(s): LIPASE, AMYLASE in the last 168 hours. No results for input(s): AMMONIA in the last 168 hours. Coagulation Profile: No results for input(s): INR, PROTIME in the last 168 hours. Cardiac Enzymes: Recent Labs  Lab 03/12/18 1311 03/12/18 1931 03/13/18 0025  TROPONINI <0.03 <0.03 0.03*   BNP (last 3 results) No results for input(s): PROBNP in the last 8760 hours. HbA1C: No results for input(s): HGBA1C in the last 72 hours. CBG: No results for input(s): GLUCAP in the last 168  hours. Lipid Profile: No results for input(s): CHOL, HDL, LDLCALC, TRIG, CHOLHDL, LDLDIRECT in the last 72 hours. Thyroid Function Tests: No results for input(s): TSH, T4TOTAL, FREET4, T3FREE, THYROIDAB in the last 72 hours. Anemia Panel: No results for input(s): VITAMINB12, FOLATE, FERRITIN, TIBC, IRON, RETICCTPCT in the last 72 hours. Sepsis Labs: No results for input(s): PROCALCITON, LATICACIDVEN in the last 168 hours.  Recent Results (from the past 240 hour(s))  Urine culture     Status: Abnormal   Collection Time: 03/11/18  2:26 PM  Result Value Ref Range Status   Specimen Description URINE, CLEAN CATCH  Final   Special Requests   Final    NONE Performed at [facility], [address] [place], [place] [address]    Culture 10,000 COLONIES/mL PROTEUS MIRABILIS (A)  Final   Report Status 03/13/2018 FINAL  Final   Organism ID, Bacteria PROTEUS MIRABILIS (A)  Final      Susceptibility   Proteus mirabilis - MIC*    AMPICILLIN <=2 SENSITIVE Sensitive     CEFAZOLIN <=4 SENSITIVE Sensitive     CEFTRIAXONE <=1 SENSITIVE Sensitive     CIPROFLOXACIN <=0.25 SENSITIVE Sensitive     GENTAMICIN <=1 SENSITIVE Sensitive     IMIPENEM 4 SENSITIVE Sensitive     NITROFURANTOIN 128 RESISTANT Resistant     TRIMETH/SULFA <=20 SENSITIVE Sensitive     AMPICILLIN/SULBACTAM <=2 SENSITIVE Sensitive     PIP/TAZO <=4 SENSITIVE Sensitive     * 10,000 COLONIES/mL PROTEUS MIRABILIS  Culture, Urine     Status: Abnormal   Collection Time: 03/11/18  7:04 PM  Result Value Ref Range Status   Specimen Description URINE, CLEAN CATCH  Final   Special Requests   Final    NONE Performed at [facility], [address], [place], [place] [address]    Culture 50,000 COLONIES/mL PROTEUS MIRABILIS (A)  Final   Report Status 03/14/2018 FINAL  Final   Organism ID, Bacteria PROTEUS MIRABILIS (A)  Final      Susceptibility   Proteus mirabilis - MIC*    AMPICILLIN <=2 SENSITIVE Sensitive  CEFAZOLIN  <=4 SENSITIVE Sensitive     CEFTRIAXONE <=1 SENSITIVE Sensitive     CIPROFLOXACIN <=0.25 SENSITIVE Sensitive     GENTAMICIN <=1 SENSITIVE Sensitive     IMIPENEM 2 SENSITIVE Sensitive     NITROFURANTOIN 128 RESISTANT Resistant     TRIMETH/SULFA <=20 SENSITIVE Sensitive     AMPICILLIN/SULBACTAM <=2 SENSITIVE Sensitive     PIP/TAZO <=4 SENSITIVE Sensitive     * 50,000 COLONIES/mL PROTEUS MIRABILIS      Radiology Studies: Dg Chest 1 View  Result Date: 03/14/2018 CLINICAL DATA:  Shortness of breath and chest pain EXAM: CHEST  1 VIEW COMPARISON:  03/11/2018 FINDINGS: Cardiac shadows within normal limits. Aortic calcifications are again seen. The lungs are hyperinflated consistent with COPD. Mild apical scarring is noted bilaterally. No sizable infiltrate is seen. No bony abnormality is noted. IMPRESSION: COPD without acute abnormality. Electronically Signed   By: Inez Catalina M.D.   On: 03/14/2018 12:04      LOS: 3 days    Bonnell Public, MD [facility]  03/14/2018, 12:46 PM

## 2018-03-14 NOTE — Care Management Important Message (Signed)
Important Message  Patient Details  Name: Anne Bradshaw MRN: [id number] Date of Birth: 1927-11-29   Medicare Important Message Given:  Yes    Dorena Bodo 03/14/2018, 2:15 PM

## 2018-03-14 NOTE — Progress Notes (Signed)
PHARMACY NOTE:  ANTIMICROBIAL RENAL DOSAGE ADJUSTMENT  Current antimicrobial regimen includes a mismatch between antimicrobial dosage and estimated renal function.  As per policy approved by the Pharmacy & Therapeutics and Medical Executive Committees, the antimicrobial dosage will be adjusted accordingly.  Current antimicrobial dosage:  Famciclovir 250 mg daily  Indication: Per patient report - suppression of shingles given history of recurrent episodes  Renal Function:  Estimated Creatinine Clearance: 20.5 mL/min (A) (by C-G formula based on SCr of 1.64 mg/dL (H)). []      On intermittent HD, scheduled: []      On CRRT    Antimicrobial dosage has been changed to:  Famciclovir 500 mg daily (PTA dose)  Additional comments: PTA dose was renally adjusted due to AKI this admit - with improving renal function, will transition the patient back to their home dose.   Thank you for allowing pharmacy to be a part of this patient's care.  Georgina Pillion, PharmD, BCPS  8:58 AM

## 2018-03-14 NOTE — Progress Notes (Signed)
PT Cancellation Note  Patient Details Name: Anne Bradshaw MRN: [id number] DOB: 07-Apr-1927   Cancelled Treatment:    Reason Eval/Treat Not Completed: Medical issues which prohibited therapy(K+ 2.8, outside range for PT treatment at this time. Will follow acutely and attempt treatment again once medically appropriate. )  2:51 PM, 03/14/18 Rosamaria Lints, PT, DPT Physical Therapist - [place] [phone] (Pager)  [phone] (Office)    [name] 03/14/2018, 2:51 PM

## 2018-03-14 NOTE — Progress Notes (Signed)
Occupational Therapy Treatment Patient Details Name: Anne Bradshaw MRN: [id number] DOB: 01/17/1928 Today's Date: 03/14/2018    History of present illness Pt is an 85 y/o female who presents to the ED s/p fall ~8 days PTA with AMS, fatigue, pain, and poor oral intake. CT scan [facility] for acute [facility], d/c home), however [facility] revealed lucencies in cervical spine concerning for myeloma. In the ED pt was found to have AKI, hypercalcemia.    OT comments  Pt progressing towards acute OT goals. Focus of session was bed mobility and simulated toilet transfer. Pt mod A for bed mobility, mod A +2 HHA for pivotal transfer this session. Noted, pt mildly tremulous once sitting EOB, reports feeling "shaky and a little dizzy, weak". VSS. bp 177/68. Spouse present throughout session. D/c plan remains appropriate.   Follow Up Recommendations  SNF;Supervision/Assistance - 24 hour    Equipment Recommendations  3 in 1 bedside commode;Tub/shower bench    Recommendations for Other Services      Precautions / Restrictions Precautions Precautions: Fall Restrictions Weight Bearing Restrictions: No       Mobility Bed Mobility Overal bed mobility: Needs Assistance Bed Mobility: Supine to Sit     Supine to sit: Mod assist;HOB elevated     General bed mobility comments: Pt able to advance BLE off EOB with extra time and effort, mod A to powerup trunk, light assist of pad to pivot hips the last little bit to full EOB position  Transfers Overall transfer level: Needs assistance Equipment used: 2 person hand held assist Transfers: Sit to/from [employer] CorporationStand;Stand Pivot Transfers Sit to Stand: Mod assist;+2 physical assistance Stand pivot transfers: Mod assist;+2 physical assistance       General transfer comment: Posterior lean, mod +2 HHA and gait belt to facilitate pivotal steps from EOB to recliner    Balance Overall balance assessment: Needs  assistance Sitting-balance support: Feet supported;Bilateral upper extremity supported Sitting balance-Leahy Scale: Poor   Postural control: Posterior lean Standing balance support: Bilateral upper extremity supported;During functional activity Standing balance-Leahy Scale: Zero Standing balance comment: +2 HHA mod A to stand, posterior lean                           ADL either performed or assessed with clinical judgement   ADL Overall ADL's : Needs assistance/impaired                         Toilet Transfer: Moderate assistance;+2 for physical assistance;Stand-pivot;BSC Toilet Transfer Details (indicate cue type and reason): posterior lean, +2 HHA         Functional mobility during ADLs: Moderate assistance;+2 for physical assistance(+2 HHA) General ADL Comments: Pt completed bed mobility, sat EOB a few minutes then SPT to recliner.     Vision       Perception     Praxis      Cognition Arousal/Alertness: Awake/alert Behavior During Therapy: Flat affect Overall Cognitive Status: Impaired/Different from baseline Area of Impairment: Following commands;Safety/judgement;Problem solving;Awareness                       Following Commands: Follows one step commands consistently Safety/Judgement: Decreased awareness of safety Awareness: Emergent Problem Solving: Slow processing;Decreased initiation;Difficulty sequencing;Requires verbal cues;Requires tactile cues          Exercises     Shoulder Instructions  General Comments Noted, pt mildly tremulous once sitting EOB, reports feeling "shaky and a little dizzy, weak". VSS. bp 177/68. Spouse present throughout session.     Pertinent Vitals/ Pain       Pain Assessment: Faces Faces Pain Scale: Hurts even more Pain Location: headache, R>L hip Pain Descriptors / Indicators: Aching;Sore Pain Intervention(s): Limited activity within patient's tolerance;Monitored during  session;Repositioned;Patient requesting pain meds-RN notified  Home Living                                          Prior Functioning/Environment              Frequency  Min 2X/week        Progress Toward Goals  OT Goals(current goals can now be found in the care plan section)  Progress towards OT goals: Progressing toward goals  Acute Rehab OT Goals Patient Stated Goal: family's goal is pt will increase strength and return to normal  OT Goal Formulation: With patient/family Time For Goal Achievement: 03/27/18 Potential to Achieve Goals: Good  Plan Discharge plan remains appropriate    Co-evaluation                 AM-PAC OT "6 Clicks" Daily Activity     Outcome Measure   Help from another person eating meals?: A Lot Help from another person taking care of personal grooming?: A Lot Help from another person toileting, which includes using toliet, bedpan, or urinal?: A Lot Help from another person bathing (including washing, rinsing, drying)?: A Lot Help from another person to put on and taking off regular upper body clothing?: A Lot Help from another person to put on and taking off regular lower body clothing?: A Lot 6 Click Score: 12    End of Session Equipment Utilized During Treatment: Gait belt  OT Visit Diagnosis: Unsteadiness on feet (R26.81);Muscle weakness (generalized) (M62.81)   Activity Tolerance Patient limited by fatigue("shaky and a little dizzy, weak" sitting EOB)   Patient Left in chair;with call bell/phone within reach;with chair alarm set;with family/visitor present   Nurse Communication Patient requests pain meds;Mobility status        Time: [id number] OT Time Calculation (min): 24 min  Charges: OT General Charges $OT Visit: 1 Visit OT Treatments $Self Care/Home Management : 23-37 mins  Anne Bradshaw, OT Acute Rehabilitation Services Pager: [phone] Office: [phone]    Anne Bradshaw, Anne Bradshaw  H 03/14/2018, 1:05 PM

## 2018-03-14 NOTE — Progress Notes (Addendum)
Patient ID: Anne Bradshaw, female   DOB: 12-02-1927, 85 y.o.   MRN: [id number] [facility] Progress Note   Assessment/ Plan:   1.  Hypercalcemia: Calcium levels are within normal range status post bisphosphonate therapy with ongoing saline.  Mental status appears to be doing better.  PTH level appropriately suppressed and not indicative of primary hyperparathyroidism.  No evidence of multiple myeloma based on SPEP/UPEP-elevated light chains noted?  Unclear if from acute kidney injury.  No evidence of vitamin D toxicity.  Awaiting PTH related peptide and 1, 25 hydroxy vitamin D level.  I suspect etiology was from relative immobility/thiazide use. 2.  Metabolic encephalopathy: Secondary to hypercalcemia-appears resolved per daughter. 3.  Acute kidney injury: Secondary to hypercalcemia, suspect that she likely has evolved into ischemic ATN from a sustained prerenal status, renal function now improving and I will decrease saline to 50 cc/h for the next 12 hours and then discontinue. 4.  Hypertension: Likely exacerbated by ongoing volume expansion/saline loading, blood pressures acceptable for her advanced age.  Subjective:   Reports to be feeling better/stronger and more lucid per daughter.  Anticipated discharge to SNF to continue physical rehabilitation.   Objective:   BP (!) 157/62 (BP Location: Left Arm)   Pulse 64   Temp 98.4 F (36.9 C) (Oral)   Resp 18   Ht '5\' 4"'  (1.626 m)   Wt 54.8 kg   SpO2 95%   BMI 20.74 kg/m   Intake/Output Summary (Last 24 hours) at 03/14/2018 3474 Last data filed at 03/14/2018 0900 Gross per 24 hour  Intake 760 ml  Output 2000 ml  Net -1240 ml   Weight change: -0.131 kg  Physical Exam: Gen: Appears to be comfortable resting in bed, daughter at bedside CVS: Pulse regular rhythm, S1 and S2 normal without any murmurs or rubs Resp: Clear to auscultation, no rales/rhonchi Abd: Soft, flat, nontender Ext: Without lower extremity  edema  Imaging: Ct Thoracic Spine Wo Contrast  Result Date: 03/12/2018 CLINICAL DATA:  Back pain with minor trauma EXAM: CT THORACIC AND LUMBAR SPINE WITHOUT CONTRAST TECHNIQUE: Multidetector CT imaging of the thoracic and lumbar spine was performed without contrast. Multiplanar CT image reconstructions were also generated. COMPARISON:  Abdominal CT 07/17/2014 FINDINGS: CT THORACIC SPINE FINDINGS Alignment: No traumatic malalignment. Vertebrae: Negative for fracture Paraspinal and other soft tissues: Reticulonodular opacities in the bilateral apical lungs, a similar pattern was seen in 2016 and this is likely chronic indolent infection or post infectious scarring. No evident hematoma or swelling. Proximal gastric diverticulum Disc levels: Midthoracic disc narrowing and endplate degeneration. Unremarkable facets. No evident impingement CT LUMBAR SPINE FINDINGS Segmentation: 5 lumbar type vertebral bodies Alignment: These no traumatic malalignment. Degenerative grade 1 anterolisthesis at L5-S1 Vertebrae: Negative for fracture, bone lesion, or erosion. Paraspinal and other soft tissues: Mild presacral stranding. There is atherosclerosis and bilateral nephrolithiasis. Disc levels: Good disc height preservation for age. Degenerative facet spurring that is advanced at L5-S1 where there is also anterolisthesis. IMPRESSION: CT THORACIC SPINE IMPRESSION 1. No acute finding. 2. Usual degenerative changes. CT LUMBAR SPINE IMPRESSION 1. No evidence of lumbar spine fracture. 2. Presacral fat stranding without visible fracture of the covered sacrum. Electronically Signed   By: Monte Fantasia M.D.   On: 03/12/2018 09:53   Ct Lumbar Spine Wo Contrast  Result Date: 03/12/2018 CLINICAL DATA:  Back pain with minor trauma EXAM: CT THORACIC AND LUMBAR SPINE WITHOUT CONTRAST TECHNIQUE: Multidetector CT imaging of the thoracic and lumbar spine was performed  without contrast. Multiplanar CT image reconstructions were also  generated. COMPARISON:  Abdominal CT 07/17/2014 FINDINGS: CT THORACIC SPINE FINDINGS Alignment: No traumatic malalignment. Vertebrae: Negative for fracture Paraspinal and other soft tissues: Reticulonodular opacities in the bilateral apical lungs, a similar pattern was seen in 2016 and this is likely chronic indolent infection or post infectious scarring. No evident hematoma or swelling. Proximal gastric diverticulum Disc levels: Midthoracic disc narrowing and endplate degeneration. Unremarkable facets. No evident impingement CT LUMBAR SPINE FINDINGS Segmentation: 5 lumbar type vertebral bodies Alignment: These no traumatic malalignment. Degenerative grade 1 anterolisthesis at L5-S1 Vertebrae: Negative for fracture, bone lesion, or erosion. Paraspinal and other soft tissues: Mild presacral stranding. There is atherosclerosis and bilateral nephrolithiasis. Disc levels: Good disc height preservation for age. Degenerative facet spurring that is advanced at L5-S1 where there is also anterolisthesis. IMPRESSION: CT THORACIC SPINE IMPRESSION 1. No acute finding. 2. Usual degenerative changes. CT LUMBAR SPINE IMPRESSION 1. No evidence of lumbar spine fracture. 2. Presacral fat stranding without visible fracture of the covered sacrum. Electronically Signed   By: Monte Fantasia M.D.   On: 03/12/2018 09:53   Labs: BMET Recent Labs  Lab 03/11/18 1323 03/11/18 1912 03/11/18 2258 03/12/18 0430 03/13/18 0356 03/14/18 0419  NA 137  --  134* 137 140 139  K 3.2*  --  2.9* 3.2* 3.3* 2.2*  CL 96*  --  98 102 107 114*  CO2 31  --  '27 24 24 ' 19*  GLUCOSE 113*  --  116* 149* 70 85  BUN 33*  --  31* 29* 33* 30*  CREATININE 1.98* 1.88* 1.84* 1.82* 2.01* 1.64*  CALCIUM 14.1*  --  12.9*  13.1* 12.2* 10.9* 9.1  PHOS 3.1  --   --   --   --   --    CBC Recent Labs  Lab 03/11/18 1323 03/11/18 1912 03/13/18 0356 03/14/18 0419  WBC 4.9 5.5 7.9 5.6  NEUTROABS 2.9  --   --   --   HGB 11.7* 12.0 9.8* 9.4*  HCT 35.3*  35.3* 29.7* 28.1*  MCV 95.1 93.6 96.7 94.3  PLT 186 184 190 150    Medications:    . acidophilus   Oral QPM  . amLODipine  5 mg Oral Daily  . enoxaparin (LOVENOX) injection  30 mg Subcutaneous Q24H  . famciclovir  500 mg Oral Daily  . lidocaine  1 patch Transdermal Q24H  . pantoprazole (PROTONIX) IV  40 mg Intravenous Q24H  . potassium chloride  40 mEq Oral Once  . prednisoLONE acetate  1 drop Both Eyes QODAY  . vitamin B-12  1,000 mcg Oral Daily  . white petrolatum       Elmarie Shiley, MD 03/14/2018, 9:24 AM

## 2018-03-14 NOTE — Progress Notes (Signed)
CRITICAL VALUE ALERT  Critical Value:  Potassium 2.2  Date & Time Notied: 03/14/18 0600  Provider Notified:  NP Schorr  Orders Received/Actions taken:  Awaiting response

## 2018-03-14 NOTE — Clinical Social Work Placement (Signed)
   CLINICAL SOCIAL WORK PLACEMENT  NOTE **Patient will d/c to [facility] when medically stable**  Date:  03/14/2018  Patient Details  Name: Anne Bradshaw MRN: [id number] Date of Birth: 02/04/1928  Clinical Social Work is seeking post-discharge placement for this patient at the Skilled  Nursing Facility level of care (*CSW will initial, date and re-position this form in  chart as items are completed):  Yes   Patient/family provided with [facility] Work Department's list of facilities offering this level of care within the geographic area requested by the patient (or if unable, by the patient's family).  Yes   Patient/family informed of their freedom to choose among providers that offer the needed level of care, that participate in Medicare, Medicaid or managed care program needed by the patient, have an available bed and are willing to accept the patient.  Yes   Patient/family informed of [facility] ownership interest in [facility] and [facility], as well as of the fact that they are under no obligation to receive care at these facilities.  PASRR submitted to EDS on 03/14/18     PASRR number received on 03/14/18     Existing PASRR number confirmed on       FL2 transmitted to all facilities in geographic area requested by pt/family on 03/14/18     FL2 transmitted to all facilities within larger geographic area on       Patient informed that his/her managed care company has contracts with or will negotiate with certain facilities, including the following:        Yes   Patient/family informed of bed offers received.  Patient chooses bed at [facility]     Physician recommends and patient chooses bed at      Patient to be transferred to [facility] on  .  Patient to be transferred to facility by       Patient family notified on   of transfer.  Name of family member notified:        PHYSICIAN        Additional Comment:    _______________________________________________ Cristobal Goldmann, LCSW 03/14/2018, 4:58 PM

## 2018-03-15 DIAGNOSIS — N3000 Acute cystitis without hematuria: Secondary | ICD-10-CM

## 2018-03-15 DIAGNOSIS — I160 Hypertensive urgency: Secondary | ICD-10-CM

## 2018-03-15 LAB — CBC
HCT: 29.3 % — ABNORMAL LOW (ref 36.0–46.0)
Hemoglobin: 10.1 g/dL — ABNORMAL LOW (ref 12.0–15.0)
MCH: 31.8 pg (ref 26.0–34.0)
MCHC: 34.5 g/dL (ref 30.0–36.0)
MCV: 92.1 fL (ref 80.0–100.0)
Platelets: 169 10*3/uL (ref 150–400)
RBC: 3.18 MIL/uL — ABNORMAL LOW (ref 3.87–5.11)
RDW: 13.1 % (ref 11.5–15.5)
WBC: 5.7 10*3/uL (ref 4.0–10.5)
nRBC: 0 % (ref 0.0–0.2)

## 2018-03-15 LAB — RENAL FUNCTION PANEL
Albumin: 2.4 g/dL — ABNORMAL LOW (ref 3.5–5.0)
Anion gap: 6 (ref 5–15)
BUN: 25 mg/dL — ABNORMAL HIGH (ref 8–23)
CO2: 19 mmol/L — AB (ref 22–32)
Calcium: 9.5 mg/dL (ref 8.9–10.3)
Chloride: 113 mmol/L — ABNORMAL HIGH (ref 98–111)
Creatinine, Ser: 1.34 mg/dL — ABNORMAL HIGH (ref 0.44–1.00)
GFR calc Af Amer: 41 mL/min — ABNORMAL LOW (ref >60)
GFR calc non Af Amer: 35 mL/min — ABNORMAL LOW (ref >60)
Glucose, Bld: 113 mg/dL — ABNORMAL HIGH (ref 70–99)
Phosphorus: 1.8 mg/dL — ABNORMAL LOW (ref 2.5–4.6)
Potassium: 3 mmol/L — ABNORMAL LOW (ref 3.5–5.1)
Sodium: 138 mmol/L (ref 135–145)

## 2018-03-15 LAB — MAGNESIUM: Magnesium: 1.4 mg/dL — ABNORMAL LOW (ref 1.7–2.4)

## 2018-03-15 MED ORDER — CARVEDILOL 3.125 MG PO TABS
3.1250 mg | ORAL_TABLET | Freq: Two times a day (BID) | ORAL | Status: DC
  Administered 2018-03-15 – 2018-03-16 (×4): 3.125 mg via ORAL
  Filled 2018-03-15 (×4): qty 1

## 2018-03-15 MED ORDER — POTASSIUM CHLORIDE CRYS ER 20 MEQ PO TBCR
40.0000 meq | EXTENDED_RELEASE_TABLET | ORAL | Status: AC
  Administered 2018-03-15 (×2): 40 meq via ORAL
  Filled 2018-03-15 (×2): qty 2

## 2018-03-15 MED ORDER — MAGNESIUM SULFATE 2 GM/50ML IV SOLN
2.0000 g | Freq: Once | INTRAVENOUS | Status: AC
  Administered 2018-03-15: 2 g via INTRAVENOUS
  Filled 2018-03-15: qty 50

## 2018-03-15 MED ORDER — AMLODIPINE BESYLATE 10 MG PO TABS
10.0000 mg | ORAL_TABLET | Freq: Every day | ORAL | Status: DC
  Administered 2018-03-15 – 2018-03-16 (×2): 10 mg via ORAL
  Filled 2018-03-15 (×2): qty 1

## 2018-03-15 NOTE — Progress Notes (Signed)
PROGRESS NOTE    Anne Bradshaw  YHC:[id number] DOB: 11-01-1927 DOA: 03/11/2018 PCP: Mayra Neer, MD   Brief Narrative: 85 year old female with history except for GERD, hypertension on HCTZ, macular degeneration brought to the ER for evaluation of altered mental status, fatigue, poor oral intake since her fall 8 days back where at [facility] long  ED CT scan showed no acute injuries, was discharged home however continued to have severe muscle aches and pain brought to the ER  In the ER, found to have AKI, hypercalcemia, CT of the c-spine showed lucency indicating myeloma, CT head was negative blood pressure was in 517O systolic.  Patient was admitted for further management and nephrology was consulted. Patient mental status overall improving with current management with improving hypercalcemia   03/14/2018: Patient seen.  Calcium is down to 9.1.  Potassium is 2.2 today.  We will continue to replete potassium level.  Will check magnesium level.  IV fluids normal saline has been decreased to 50 cc an hour.  Will have a low threshold to add KCl to the normal saline as well.  Patient reported intermittent shortness of breath, but chest x-ray revealed COPD changes.  No evidence of multiple myeloma to date.  03/15/2018: Patient seen alongside patient's husband and daughter.  Currently elevated blood pressure noted.  Will increase amlodipine from 5 Mg p.o. once daily to 10 mg p.o. once daily.  Will add Coreg 3.125 Mg p.o. twice daily.  Will monitor patient's blood pressure overnight.  Likely discharge to skilled nursing facility in the morning.  We will continue to replete low potassium.  Calcium is back to normal.  IV fluid has been discontinued.  Hopefully, with discontinuation of IV fluid, patie'snt blood pressure will be better controlled as well.  Subjective: Patient seen alongside patient's husband and daughter.   No new complaints. Elevated blood pressure noted. Low potassium noted.  Assessment  & Plan:  Acute metabolic encephalopathy: -Resolved.  -Likely secondary to hypercalcemia and volume depletion.     Hypercalcemia: 14.1-->12.2->10.9-->9.1. Hypercalcemia has resolved. Possibly secondary to immobility, volume depletion and HCTZ Nephrology input is appreciated Work-up is in progress.  IV fluids is decreased to 50 cc/h today.  Follow PTH-related protein as well as 25-hydroxy vitamin D level. 03/15/2018: Calcium is 9.5 today.  AKI :  Possibly secondary to prerenal and ATN (rule of volume depletion and hypercalcemia).   Serum creatinine has continued to improve.   Continue to monitor closely.  03/15/2018: Serum creatinine is 1.34 today.  Creatinine continues to improve daily.  Recent Labs  Lab 03/12/18 0430 03/13/18 0356 03/14/18 0419 03/14/18 1151 03/15/18 0409  CREATININE 1.82* 2.01* 1.64* 1.56* 1.34*   Hypertension:  Continue to optimize.   Continue current medication.   Hopefully, blood pressure will improve as the rate of normal saline has been decreased.   Further management will depend on hospital course.    03/15/2018: uncontrol blood pressure noted today.  Increase Norvasc from 5 mg p.o. once daily to 10 Mg p.o. once daily.  Start Coreg 3.125 Mg p.o. twice daily.  Hypokalemia:  Continue to replete and monitor.   Check magnesium level. Continue to replete orally.  Proteus UTI : Complete course of antibiotics.   Patient is on ceftriaxone.   03/15/2018: Complete course of antibiotics today.  Back pain: No acute finding on CT thoracic and lumbar spine.Continue pain control.  DVT prophylaxis: LOVENOX Code Status: DNR Family Communication: Daughter  disposition Plan:Remains inpatient pending clinical improvement. cont PT OT Palliative care  consulted for goals of care.   Consultants:  Nephrology  Procedures: none 03/11/18   CT C spine  1. No acute intracranial pathology. 2.  No acute osseous injury of the cervical spine. 3. Cervical spine  spondylosis as described above. 4. Multiple small lucent lesions in the cervical spine vertebral bodies and posterior elements which may be secondary to osteopenia, but a similar appearance can be seen in the setting of multiple myeloma. Correlate with laboratory values.  03/12/18 CT THORACIC SPINE IMPRESSION  1. No acute finding. 2. Usual degenerative changes.  CT LUMBAR SPINE IMPRESSION  1. No evidence of lumbar spine fracture. 2. Presacral fat stranding without visible fracture of the covered Sacrum.  Antimicrobials: Anti-infectives (From admission, onward)   Start     Dose/Rate Route Frequency Ordered Stop   03/14/18 1000  famciclovir [facility]) tablet 500 mg     500 mg Oral Daily 03/14/18 0844     03/13/18 1000  famciclovir [facility]) tablet 250 mg  Status:  Discontinued     250 mg Oral Daily 03/13/18 0638 03/14/18 0844   03/12/18 1000  famciclovir (FAMVIR) tablet 500 mg  Status:  Discontinued     500 mg Oral Daily 03/11/18 1705 03/13/18 0638   03/11/18 2000  cefTRIAXone (ROCEPHIN) 1 g in sodium chloride 0.9 % 100 mL IVPB     1 g 200 mL/hr over 30 Minutes Intravenous Every 24 hours 03/11/18 1904 03/15/18 2359       Objective: Vitals:   03/14/18 1717 03/14/18 2136 03/15/18 0646 03/15/18 0801  BP: (!) 145/53 (!) 129/51 (!) 159/59 (!) 194/63  Pulse: 74 61 67 75  Resp: _0 Temp: (!) 97.4 F (36.3 C) 98.4 F (36.9 C) 98.7 F (37.1 C) (!) 97.5 F (36.4 C)  TempSrc: Oral Oral Oral Oral  SpO2: 94% 94% 95% 96%  Weight:  54.7 kg    Height:        Intake/Output Summary (Last 24 hours) at 03/15/2018 1710 Last data filed at 03/15/2018 1500 Gross per 24 hour  Intake 1010.89 ml  Output 1700 ml  Net -689.11 ml   Filed Weights   03/13/18 0336 03/13/18 2133 03/14/18 2136  Weight: 54.9 kg 54.8 kg 54.7 kg   Weight change: -0.1 kg  Body mass index is 20.7 kg/m.  Intake/Output from previous day: 01/22 0701 - 01/23 0700 In: 1325.1 [P.O.:540; I.V.:578; IV  Piggyback:207.1] Out: 1500 [Urine:1500] Intake/Output this shift: Total I/O In: 340 [P.O.:340] Out: 800 [Urine:800]  Examination:  General exam:Calm,comfortable HEENT: Mild pallor.  No jaundice.   Respiratory system: Clear to auscultation anteriorly.  Cardiovascular system: S1-S2.   Gastrointestinal system:Abdomen soft, non-tender, non-distended, BS +.No hepatosplenomegaly palpable. Nervous System: Awake and alert.  Patient moves all limbs.   Extremities: No leg edema.   Medications:  Scheduled Meds: . acidophilus   Oral QPM  . amLODipine  10 mg Oral Daily  . carvedilol  3.125 mg Oral BID WC  . enoxaparin (LOVENOX) injection  30 mg Subcutaneous Q24H  . famciclovir  500 mg Oral Daily  . fluticasone furoate-vilanterol  1 puff Inhalation Daily  . lidocaine  1 patch Transdermal Q24H  . pantoprazole (PROTONIX) IV  40 mg Intravenous Q24H  . prednisoLONE acetate  1 drop Both Eyes QODAY  . umeclidinium bromide  1 puff Inhalation Daily  . vitamin B-12  1,000 mcg Oral Daily   Continuous Infusions: . cefTRIAXone (ROCEPHIN)  IV 1 g (03/14/18 2057)    Data Reviewed: I  have personally reviewed following labs and imaging studies  CBC: Recent Labs  Lab 03/11/18 1323 03/11/18 1912 03/13/18 0356 03/14/18 0419 03/15/18 0409  WBC 4.9 5.5 7.9 5.6 5.7  NEUTROABS 2.9  --   --   --   --   HGB 11.7* 12.0 9.8* 9.4* 10.1*  HCT 35.3* 35.3* 29.7* 28.1* 29.3*  MCV 95.1 93.6 96.7 94.3 92.1  PLT 186 184 190 150 222   Basic Metabolic Panel: Recent Labs  Lab 03/11/18 1323  03/12/18 0430 03/13/18 0356 03/14/18 0419 03/14/18 1151 03/15/18 0409  NA 137   < > 137 140 139 136 138  K 3.2*   < > 3.2* 3.3* 2.2* 2.8* 3.0*  CL 96*   < > 102 107 114* 109 113*  CO2 31   < > 24 24 19* 17* 19*  GLUCOSE 113*   < > 149* 70 85 104* 113*  BUN 33*   < > 29* 33* 30* 30* 25*  CREATININE 1.98*   < > 1.82* 2.01* 1.64* 1.56* 1.34*  CALCIUM 14.1*   < > 12.2* 10.9* 9.1 9.6 9.5  MG 1.9  --   --   --   --   1.3* 1.4*  PHOS 3.1  --   --   --   --  1.6* 1.8*   < > = values in this interval not displayed.   GFR: Estimated Creatinine Clearance: 25.1 mL/min (A) (by C-G formula based on SCr of 1.34 mg/dL (H)). Liver Function Tests: Recent Labs  Lab 03/11/18 1323 03/12/18 0430 03/14/18 1151 03/15/18 0409  AST 53* 47*  --   --   ALT 36 33  --   --   ALKPHOS 61 54  --   --   BILITOT 0.8 0.7  --   --   PROT 7.5 7.2  --   --   ALBUMIN 3.6 3.2* 2.6* 2.4*   No results for input(s): LIPASE, AMYLASE in the last 168 hours. No results for input(s): AMMONIA in the last 168 hours. Coagulation Profile: No results for input(s): INR, PROTIME in the last 168 hours. Cardiac Enzymes: Recent Labs  Lab 03/12/18 1311 03/12/18 1931 03/13/18 0025  TROPONINI <0.03 <0.03 0.03*   BNP (last 3 results) No results for input(s): PROBNP in the last 8760 hours. HbA1C: No results for input(s): HGBA1C in the last 72 hours. CBG: No results for input(s): GLUCAP in the last 168 hours. Lipid Profile: No results for input(s): CHOL, HDL, LDLCALC, TRIG, CHOLHDL, LDLDIRECT in the last 72 hours. Thyroid Function Tests: No results for input(s): TSH, T4TOTAL, FREET4, T3FREE, THYROIDAB in the last 72 hours. Anemia Panel: No results for input(s): VITAMINB12, FOLATE, FERRITIN, TIBC, IRON, RETICCTPCT in the last 72 hours. Sepsis Labs: No results for input(s): PROCALCITON, LATICACIDVEN in the last 168 hours.  Recent Results (from the past 240 hour(s))  Urine culture     Status: Abnormal   Collection Time: 03/11/18  2:26 PM  Result Value Ref Range Status   Specimen Description URINE, CLEAN CATCH  Final   Special Requests   Final    NONE Performed at [facility], [address] [place], [place] [address]    Culture 10,000 COLONIES/mL PROTEUS MIRABILIS (A)  Final   Report Status 03/13/2018 FINAL  Final   Organism ID, Bacteria PROTEUS MIRABILIS (A)  Final      Susceptibility   Proteus mirabilis - MIC*     AMPICILLIN <=2 SENSITIVE Sensitive  CEFAZOLIN <=4 SENSITIVE Sensitive     CEFTRIAXONE <=1 SENSITIVE Sensitive     CIPROFLOXACIN <=0.25 SENSITIVE Sensitive     GENTAMICIN <=1 SENSITIVE Sensitive     IMIPENEM 4 SENSITIVE Sensitive     NITROFURANTOIN 128 RESISTANT Resistant     TRIMETH/SULFA <=20 SENSITIVE Sensitive     AMPICILLIN/SULBACTAM <=2 SENSITIVE Sensitive     PIP/TAZO <=4 SENSITIVE Sensitive     * 10,000 COLONIES/mL PROTEUS MIRABILIS  Culture, Urine     Status: Abnormal   Collection Time: 03/11/18  7:04 PM  Result Value Ref Range Status   Specimen Description URINE, CLEAN CATCH  Final   Special Requests   Final    NONE Performed at [facility], [place] [address] [place], [place] [address]    Culture 50,000 COLONIES/mL PROTEUS MIRABILIS (A)  Final   Report Status 03/14/2018 FINAL  Final   Organism ID, Bacteria PROTEUS MIRABILIS (A)  Final      Susceptibility   Proteus mirabilis - MIC*    AMPICILLIN <=2 SENSITIVE Sensitive     CEFAZOLIN <=4 SENSITIVE Sensitive     CEFTRIAXONE <=1 SENSITIVE Sensitive     CIPROFLOXACIN <=0.25 SENSITIVE Sensitive     GENTAMICIN <=1 SENSITIVE Sensitive     IMIPENEM 2 SENSITIVE Sensitive     NITROFURANTOIN 128 RESISTANT Resistant     TRIMETH/SULFA <=20 SENSITIVE Sensitive     AMPICILLIN/SULBACTAM <=2 SENSITIVE Sensitive     PIP/TAZO <=4 SENSITIVE Sensitive     * 50,000 COLONIES/mL PROTEUS MIRABILIS      Radiology Studies: Dg Chest 1 View  Result Date: 03/14/2018 CLINICAL DATA:  Shortness of breath and chest pain EXAM: CHEST  1 VIEW COMPARISON:  03/11/2018 FINDINGS: Cardiac shadows within normal limits. Aortic calcifications are again seen. The lungs are hyperinflated consistent with COPD. Mild apical scarring is noted bilaterally. No sizable infiltrate is seen. No bony abnormality is noted. IMPRESSION: COPD without acute abnormality. Electronically Signed   By: Inez Catalina M.D.   On: 03/14/2018 12:04      LOS: 4 days     Bonnell Public, MD [facility]  03/15/2018, 5:10 PM

## 2018-03-15 NOTE — Progress Notes (Signed)
Patient ID: EURA MCCAUSLIN, female   DOB: 12/23/1927, 85 y.o.   MRN: [id number] [facility] Progress Note   Assessment/ Plan:   1.  Hypercalcemia: Corrected with intravenous bisphosphonate/hydration-calcium levels now back to baseline.  No evidence of multiple myeloma based on SPEP/UPEP-elevated light chains noted?  Unclear if from acute kidney injury.  No evidence of vitamin D toxicity.  Awaiting PTH related peptide and 1, 25 hydroxy vitamin D level.  I suspect etiology was from relative immobility/thiazide use. 2.  Metabolic encephalopathy: Secondary to hypercalcemia-mental status now back to baseline per family members.  With debilitation for which she will be admitted to a skilled nursing facility. 3.  Acute kidney injury: Secondary to hypercalcemia, with ATN-now with consistent improving renal function and I will discontinue intravenous fluids. 4.  Hypertension: Likely exacerbated by ongoing volume expansion/saline loading, blood pressures acceptable for her advanced age. 5. Hypokalemia: Continue oral supplementation  Able to discharge from renal standpoint.  I will sign off, call with questions.  Recommended plan for follow-up is for weekly labs to be assessed by her PCP.  DO NOT RESTART HCTZ.  Subjective:   Reports to be feeling better/stronger and more lucid per daughter.  Anticipated discharge to SNF to continue physical rehabilitation.   Objective:   BP (!) 194/63 (BP Location: Left Arm)   Pulse 75   Temp (!) 97.5 F (36.4 C) (Oral)   Resp 18   Ht '5\' 4"'  (1.626 m)   Wt 54.7 kg   SpO2 96%   BMI 20.70 kg/m   Intake/Output Summary (Last 24 hours) at 03/15/2018 1021 Last data filed at 03/15/2018 0230 Gross per 24 hour  Intake 1205.14 ml  Output 1500 ml  Net -294.86 ml   Weight change: -0.1 kg  Physical Exam: Gen: Appears to be comfortable resting in bed, daughter and husband at bedside CVS: Pulse regular rhythm, S1 and S2 normal without any murmurs or  rubs Resp: Clear to auscultation, no rales/rhonchi Abd: Soft, flat, nontender Ext: Without lower extremity edema  Imaging: Dg Chest 1 View  Result Date: 03/14/2018 CLINICAL DATA:  Shortness of breath and chest pain EXAM: CHEST  1 VIEW COMPARISON:  03/11/2018 FINDINGS: Cardiac shadows within normal limits. Aortic calcifications are again seen. The lungs are hyperinflated consistent with COPD. Mild apical scarring is noted bilaterally. No sizable infiltrate is seen. No bony abnormality is noted. IMPRESSION: COPD without acute abnormality. Electronically Signed   By: Inez Catalina M.D.   On: 03/14/2018 12:04   Labs: BMET Recent Labs  Lab 03/11/18 1323 03/11/18 1912 03/11/18 2258 03/12/18 0430 03/13/18 0356 03/14/18 0419 03/14/18 1151 03/15/18 0409  NA 137  --  134* 137 140 139 136 138  K 3.2*  --  2.9* 3.2* 3.3* 2.2* 2.8* 3.0*  CL 96*  --  98 102 107 114* 109 113*  CO2 31  --  '27 24 24 ' 19* 17* 19*  GLUCOSE 113*  --  116* 149* 70 85 104* 113*  BUN 33*  --  31* 29* 33* 30* 30* 25*  CREATININE 1.98* 1.88* 1.84* 1.82* 2.01* 1.64* 1.56* 1.34*  CALCIUM 14.1*  --  12.9*  13.1* 12.2* 10.9* 9.1 9.6 9.5  PHOS 3.1  --   --   --   --   --  1.6* 1.8*   CBC Recent Labs  Lab 03/11/18 1323 03/11/18 1912 03/13/18 0356 03/14/18 0419 03/15/18 0409  WBC 4.9 5.5 7.9 5.6 5.7  NEUTROABS 2.9  --   --   --   --  HGB 11.7* 12.0 9.8* 9.4* 10.1*  HCT 35.3* 35.3* 29.7* 28.1* 29.3*  MCV 95.1 93.6 96.7 94.3 92.1  PLT 186 184 190 150 169    Medications:    . acidophilus   Oral QPM  . amLODipine  10 mg Oral Daily  . carvedilol  3.125 mg Oral BID WC  . enoxaparin (LOVENOX) injection  30 mg Subcutaneous Q24H  . famciclovir  500 mg Oral Daily  . fluticasone furoate-vilanterol  1 puff Inhalation Daily  . lidocaine  1 patch Transdermal Q24H  . pantoprazole (PROTONIX) IV  40 mg Intravenous Q24H  . potassium chloride  40 mEq Oral Q4H  . prednisoLONE acetate  1 drop Both Eyes QODAY  . umeclidinium  bromide  1 puff Inhalation Daily  . vitamin B-12  1,000 mcg Oral Daily   Elmarie Shiley, MD 03/15/2018, 10:21 AM

## 2018-03-15 NOTE — Progress Notes (Signed)
Physical Therapy Treatment Patient Details Name: Anne Bradshaw MRN: [id number] DOB: 09/06/1927 Today's Date: 03/15/2018    History of Present Illness Pt is an 85 y/o female who presents to the ED s/p fall ~8 days PTA with AMS, fatigue, pain, and poor oral intake. CT [facility]/c home), [facility] revealed lucencies in cervical spine concerning for myeloma. In the ED pt was found to have AKI, hypercalcemia.     PT Comments    Pt progressing towards physical therapy goals. Was able to perform transfers with +2 assist for balance support and safety, and pre-gait activity EOB (marching in place and side steps R and L). Tolerance for functional activity remains low, however pt is motivated to participate. Will continue to follow and progress as able per POC.   Follow Up Recommendations  SNF;Supervision/Assistance - 24 hour     Equipment Recommendations  Other (comment)(TBD by next venue of care or progress with PT)    Recommendations for Other Services       Precautions / Restrictions Precautions Precautions: Fall Restrictions Weight Bearing Restrictions: No    Mobility  Bed Mobility Overal bed mobility: Needs Assistance Bed Mobility: Supine to Sit Rolling: Min assist;+2 for physical assistance Sidelying to sit: Min assist;+2 for physical assistance   Sit to supine: Mod assist;+2 for physical assistance   General bed mobility comments: Pt able to advance BLE off EOB with extra time and effort, mod A to powerup trunk, light assist of pad to pivot hips the last little bit to full EOB position  Transfers Overall transfer level: Needs assistance Equipment used: Rolling walker (2 wheeled) Transfers: Sit to/from Stand Sit to Stand: Mod assist;+2 physical assistance         General transfer comment: Heavy posterior lean and difficulty extending hips/knees into  standing.  Ambulation/Gait Ambulation/Gait assistance: Mod assist;+2 physical assistance Gait Distance (Feet): 10 Feet Assistive device: Rolling walker (2 wheeled) Gait Pattern/deviations: Step-to pattern;Decreased stride length;Narrow base of support Gait velocity: Decreased Gait velocity interpretation: 1.31 - 2.62 ft/sec, indicative of limited community ambulator General Gait Details: Pre-gait activity EOB due to pt shaking and reporting extreme anxiety. We were able to attempt marching in place, and pt ambulated 5 feet towards foot of bed and [facility] feet [facility].    Stairs             Wheelchair Mobility    Modified Rankin (Stroke Patients Only)       Balance Overall balance assessment: Needs assistance Sitting-balance support: Feet supported;Bilateral upper extremity supported Sitting balance-Leahy Scale: Poor Sitting balance - Comments: Requires at least 1 UE support on bed to maintain sitting balance Postural control: Posterior lean Standing balance support: Bilateral upper extremity supported;During functional activity Standing balance-Leahy Scale: Poor Standing balance comment: posterior lean                            Cognition Arousal/Alertness: Awake/alert Behavior During Therapy: Flat affect Overall Cognitive Status: Impaired/Different from baseline Area of Impairment: Following commands;Safety/judgement;Problem solving;Awareness                 Orientation Level: Disoriented to;Place     Following Commands: Follows one step commands consistently Safety/Judgement: Decreased awareness of safety Awareness: Emergent Problem Solving: Slow processing;Decreased initiation;Difficulty sequencing;Requires verbal cues;Requires tactile cues        Exercises      General  Comments        Pertinent Vitals/Pain Pain Assessment: Faces Faces Pain Scale: Hurts little more Pain Location: trunk with general mobilization Pain Descriptors /  Indicators: Grimacing;Guarding Pain Intervention(s): Monitored during session;Repositioned    Home Living                      Prior Function            PT Goals (current goals can now be found in the care plan section) Acute Rehab PT Goals Patient Stated Goal: family's goal is pt will increase strength and return to normal  PT Goal Formulation: With patient/family Time For Goal Achievement: 03/26/18 Potential to Achieve Goals: Fair Progress towards PT goals: Progressing toward goals    Frequency    Min 3X/week      PT Plan      Co-evaluation              AM-PAC PT "6 Clicks" Mobility   Outcome Measure  Help needed turning from your back to your side while in a flat bed without using bedrails?: A Lot Help needed moving from lying on your back to sitting on the side of a flat bed without using bedrails?: A Lot Help needed moving to and from a bed to a chair (including a wheelchair)?: A Lot Help needed standing up from a chair using your arms (e.g., wheelchair or bedside chair)?: A Lot Help needed to walk in hospital room?: Total Help needed climbing 3-5 steps with a railing? : Total 6 Click Score: 10    End of Session Equipment Utilized During Treatment: Gait belt Activity Tolerance: Patient limited by fatigue Patient left: in bed;with call bell/phone within reach;with family/visitor present Nurse Communication: Mobility status PT Visit Diagnosis: Muscle weakness (generalized) (M62.81);History of falling (Z91.81);Difficulty in walking, not elsewhere classified (R26.2)     Time: [id number] PT Time Calculation (min) (ACUTE ONLY): 38 min  Charges:  $Gait Training: 23-37 mins $Neuromuscular Re-education: 8-22 mins                     Conni SlipperLaura Julyan Gales, PT, DPT Acute Rehabilitation Services Pager: [phone] Office: [phone]    Anne Bradshaw 03/15/2018, 2:07 PM

## 2018-03-15 NOTE — Care Management Note (Signed)
Case Management Note  Patient Details  Name: Anne Bradshaw MRN: [id number] Date of Birth: [birth year]  Subjective/Objective:                    Action/Plan:  Anticipate return to SNF [facility] as facilitated by CSW whem medically stable.  Expected Discharge Date:                  Expected Discharge Plan:  Skilled Nursing Facility  In-House Referral:  Clinical Social Work  Discharge planning Services     Post Acute Care Choice:    Choice offered to:     DME Arranged:    DME Agency:     HH Arranged:    HH Agency:     Status of Service:  Completed, signed off  If discussed at [employer], dates discussed:    Additional Comments:  Lawerance Sabal, RN 03/15/2018, 10:09 AM

## 2018-03-16 LAB — RENAL FUNCTION PANEL
Albumin: 2.5 g/dL — ABNORMAL LOW (ref 3.5–5.0)
Anion gap: 6 (ref 5–15)
BUN: 18 mg/dL (ref 8–23)
CO2: 21 mmol/L — ABNORMAL LOW (ref 22–32)
Calcium: 9.1 mg/dL (ref 8.9–10.3)
Chloride: 110 mmol/L (ref 98–111)
Creatinine, Ser: 1.29 mg/dL — ABNORMAL HIGH (ref 0.44–1.00)
GFR calc Af Amer: 43 mL/min — ABNORMAL LOW (ref >60)
GFR calc non Af Amer: 37 mL/min — ABNORMAL LOW (ref >60)
Glucose, Bld: 151 mg/dL — ABNORMAL HIGH (ref 70–99)
Phosphorus: 1.4 mg/dL — ABNORMAL LOW (ref 2.5–4.6)
Potassium: 3.2 mmol/L — ABNORMAL LOW (ref 3.5–5.1)
Sodium: 137 mmol/L (ref 135–145)

## 2018-03-16 LAB — MAGNESIUM: Magnesium: 1.6 mg/dL — ABNORMAL LOW (ref 1.7–2.4)

## 2018-03-16 MED ORDER — POTASSIUM CHLORIDE CRYS ER 20 MEQ PO TBCR
40.0000 meq | EXTENDED_RELEASE_TABLET | ORAL | Status: AC
  Administered 2018-03-16 (×2): 40 meq via ORAL
  Filled 2018-03-16 (×2): qty 2

## 2018-03-16 MED ORDER — UMECLIDINIUM BROMIDE 62.5 MCG/INH IN AEPB: 1.0000 | INHALATION_SPRAY | Freq: Every day | RESPIRATORY_TRACT | 0 refills | Status: DC

## 2018-03-16 MED ORDER — LIDOCAINE 5 % EX PTCH: 1.0000 | MEDICATED_PATCH | CUTANEOUS | 0 refills | Status: DC

## 2018-03-16 MED ORDER — MAGNESIUM SULFATE 2 GM/50ML IV SOLN
2.0000 g | Freq: Once | INTRAVENOUS | Status: AC
  Administered 2018-03-16: 2 g via INTRAVENOUS
  Filled 2018-03-16: qty 50

## 2018-03-16 MED ORDER — PANTOPRAZOLE SODIUM 40 MG PO TBEC: 40.0000 mg | DELAYED_RELEASE_TABLET | Freq: Every day | ORAL | Status: DC

## 2018-03-16 MED ORDER — CARVEDILOL 3.125 MG PO TABS: 3.1250 mg | ORAL_TABLET | Freq: Two times a day (BID) | ORAL | 0 refills | Status: DC

## 2018-03-16 MED ORDER — FLUTICASONE FUROATE-VILANTEROL 100-25 MCG/INH IN AEPB: 1.0000 | INHALATION_SPRAY | Freq: Every day | RESPIRATORY_TRACT | 0 refills | Status: DC

## 2018-03-16 MED ORDER — IPRATROPIUM-ALBUTEROL 0.5-2.5 (3) MG/3ML IN SOLN: 3.0000 mL | Freq: Four times a day (QID) | RESPIRATORY_TRACT | 0 refills | Status: DC | PRN

## 2018-03-16 MED ORDER — AMLODIPINE BESYLATE 5 MG PO TABS: 5.0000 mg | ORAL_TABLET | Freq: Two times a day (BID) | ORAL | 0 refills | Status: DC

## 2018-03-16 NOTE — Clinical Social Work Placement (Addendum)
   CLINICAL SOCIAL WORK PLACEMENT  NOTE *DISCHARGED TO [facility] VIA AMBULANCE  Date:  03/16/2018  Patient Details  Name: Anne Bradshaw MRN: [id number] Date of Birth: [birth year]  Clinical Social Work is seeking post-discharge placement for this patient at the Skilled  Nursing Facility level of care (*CSW will initial, date and re-position this form in  chart as items are completed):  Yes   Patient/family provided with [place] Clinical Social Work Department's list of facilities offering this level of care within the geographic area requested by the patient (or if unable, by the patient's family).  Yes   Patient/family informed of their freedom to choose among providers that offer the needed level of care, that participate in Medicare, Medicaid or managed care program needed by the patient, have an available bed and are willing to accept the patient.  Yes   Patient/family informed of [facility]'s ownership interest in [facility] and [facility], as well as of the fact that they are under no obligation to receive care at these facilities.  PASRR submitted to EDS on 03/14/18     PASRR number received on 03/14/18     Existing PASRR number confirmed on       FL2 transmitted to all facilities in geographic area requested by pt/family on 03/14/18     FL2 transmitted to all facilities within larger geographic area on       Patient informed that his/her managed care company has contracts with or will negotiate with certain facilities, including the following:        Yes   Patient/family informed of bed offers received.  Patient chooses bed at [facility] [place]     Physician recommends and patient chooses bed at      Patient to be transferred to [facility] [place] on  03/16/18.  Patient to be transferred to facility by  ambulance   Patient family notified on  03/16/18 of transfer.  Name of family member notified:  Daughter [name] at the  bedside  PHYSICIAN       Additional Comment:    _______________________________________________ Cristobal Goldmann, LCSW 03/16/2018, 6:06 PM

## 2018-03-16 NOTE — Discharge Summary (Signed)
Physician Discharge Summary  Patient ID: Anne Bradshaw MRN: [id number] DOB/AGE: 83-10-1927 85 y.o.  Admit date: 03/11/2018 Discharge date: 03/16/2018  Admission Diagnoses:  Discharge Diagnoses:  Active Problems:   Hypercalcemia   Hypokalemia   Hypomagnesemia   Hypertensive urgency    Acute encephalopathy, combined toxic and metabolic.   UTI secondary to Proteus.    Acute kidney injury.    Back pain.  Discharged Condition: stable  Hospital Course: Patient is an 85 year old female, with past medical history significant for GERD, hypertension on HCTZ and macular degeneration.  Patient presented with altered mental status, fatigue, poor oral intake since her fall 8 days prior to presentation.  Apparently, following the fall, patient was assessed at [facility] long ER, and CT scan done at the time did not reveal any acute injuries, patient was discharged home.  On discharge, patient continued to have severe muscle aches and pain brought, as well as the presenting symptoms documented above.  Work-up done on presentation revealed that patient had acute kidney injury, and was hypercalcemic.  Mom presentation was 14.1, serum creatinine of 1.98, and potassium of 3.2.  CT of the c-spine done on admission revealed is lucency that was documented to be possibly indicative of myeloma.  Work-up for myeloma during the hospital stay till date came back negative.  CT head was negative.  Systolic blood pressure was in 683M systolic.  Patient was admitted for further management.  Nephrology was consulted due to hypercalcemia.  Patient was started on aggressive fluid resuscitation.  Hypercalcemia was also managed with calcitonin and bisphosphonate.  As documented above, work-up for myeloma was pursued.  SPEP and UPEP were ordered.  Work-up so far for multiple myeloma has been negative.  With above management, the calcium level has returned to normal.  HCTZ, immobility and possible volume depletion were thought to have  played a significant role in the elevated calcium level.  Calcium prior to discharge was 9.1.  Altered mentation has resolved.  Cultures reveal Proteus in patient's urine.  Patient has completed a course of antibiotics.  Volume depletion has resolved.  HCTZ has been held.  Potassium and magnesium will monitored and repleted during the hospital stay.  Currently repeat renal panel and magnesium in 3 days.  Patient has been optimized, will be discharged to the care of the primary care provider.  Due to blood pressure closely, as well as electrolytes.  Acute metabolic encephalopathy: -Resolved.  -Likely multifactorial.  Kindly see above.       Hypercalcemia: 14.1-->12.2->10.9-->9.1. Hypercalcemia has resolved. Possibly secondary to immobility, volume depletion and HCTZ Nephrology input is appreciated  Acute kidney injury:  Likely multifactorial. Patient was volume depleted, and had elevated calcium on presentation.   Serum creatinine has continued to improve.   Continue to monitor closely.   LastLabs         Recent Labs  Lab 03/12/18 0430 03/13/18 0356 03/14/18 0419 03/14/18 1151 03/15/18 0409  CREATININE 1.82* 2.01* 1.64* 1.56* 1.34*     Hypertensive urgency:  Control is improving. Continue to monitor closely. -Adjust medication as deemed necessary. -Patient will be discharged on Norvasc 5 mg p.o. twice daily and Coreg 3.125 Mg p.o. twice daily.   Hypokalemia/hypomagnesemia:  Continue to replete and monitor.    Proteus UTI : Complete course of antibiotics (IV Rocephin).    Back pain:  No acute finding on CT thoracic and lumbar spine. Continue pain control.  Consults: nephrology  Significant Diagnostic Studies:  labs:   Discharge Exam: Blood pressure Marland Kitchen)  187/78, pulse 70, temperature 98.1 F (36.7 C), temperature source Oral, resp. rate 19, height '5\' 4"'  (1.626 m), weight 54 kg, SpO2 95 %.  Disposition: Discharge disposition: 03-Skilled Nursing  Facility   Discharge Instructions    Diet - low sodium heart healthy   Complete by:  As directed    Discharge instructions   Complete by:  As directed    Repeat renal panel and Magnesium in 3 days (Primary Attending to review please)   Increase activity slowly   Complete by:  As directed      Allergies as of 03/16/2018   No Known Allergies     Medication List    STOP taking these medications   hydrochlorothiazide 25 MG tablet Commonly known as:  HYDRODIURIL   ibuprofen 200 MG tablet Commonly known as:  ADVIL,MOTRIN     TAKE these medications   acetaminophen 325 MG tablet Commonly known as:  TYLENOL Take 650 mg by mouth daily as needed for mild pain.   amLODipine 5 MG tablet Commonly known as:  NORVASC Take 1 tablet (5 mg total) by mouth 2 (two) times daily.   carvedilol 3.125 MG tablet Commonly known as:  COREG Take 1 tablet (3.125 mg total) by mouth 2 (two) times daily with a meal.   famciclovir 500 MG tablet Commonly known as:  FAMVIR Take 500 mg by mouth daily.   fluticasone furoate-vilanterol 100-25 MCG/INH Aepb Commonly known as:  BREO ELLIPTA Inhale 1 puff into the lungs daily. Start taking on:  March 17, 2018   ipratropium-albuterol 0.5-2.5 (3) MG/3ML Soln Commonly known as:  DUONEB Take 3 mLs by nebulization every 6 (six) hours as needed.   lidocaine 5 % Commonly known as:  LIDODERM Place 1 patch onto the skin daily. Remove & Discard patch within 12 hours or as directed by MD   prednisoLONE acetate 1 % ophthalmic suspension Commonly known as:  PRED FORTE Place 1 drop into both eyes every other day.   PRO-BIOTIC BLEND PO Take by mouth every evening.   SYSTANE 0.4-0.3 % Soln Generic drug:  Polyethyl Glycol-Propyl Glycol Apply to eye 4 (four) times daily as needed.   umeclidinium bromide 62.5 MCG/INH Aepb Commonly known as:  INCRUSE ELLIPTA Inhale 1 puff into the lungs daily. Start taking on:  March 17, 2018   vitamin B-12 1000 MCG  tablet Commonly known as:  CYANOCOBALAMIN Take 1,000 mcg by mouth daily.      Time spent: 32 minutes.  SignedBonnell Public 03/16/2018, 2:08 PM

## 2018-03-16 NOTE — Progress Notes (Signed)
PHARMACIST - PHYSICIAN COMMUNICATION DR:   Dartha Lodge CONCERNING: Protonix IV to Oral Route Change Policy  RECOMMENDATION: This patient is receiving Protonix by the intravenous route.  Based on criteria approved by the Pharmacy and Therapeutics Committee, this drug is being converted to the equivalent oral dose form(s).  DESCRIPTION: These criteria include:  The patient is eating (either orally or via tube) and/or has been taking other orally administered medications for a least 24 hours  There is no active GI bleed or impaired GI absorption noted.   If you have questions about this conversion, please contact the Pharmacy Department  []   [phone] )  [facility] [x]   [phone] )  [facility]  []   [phone] )  [facility] []   [phone] )  [facility]    Georgina Pillion, PharmD, BCPS Pager: [phone] 10:14 AM

## 2018-03-16 NOTE — Progress Notes (Addendum)
Occupational Therapy Treatment Patient Details Name: Anne Bradshaw MRN: [id number] DOB: 01-Jul-1927 Today's Date: 03/16/2018    History of present illness Pt is an 85 y/o female who presents to the ED s/p fall ~8 days PTA with AMS, fatigue, pain, and poor oral intake. CT scan negative for acute injuries (at [facility] immediately after fall, d/c home), however CT of spine here at [facility] revealed lucencies in cervical spine concerning for myeloma. In the ED pt was found to have AKI, hypercalcemia.    OT comments  Pt making good progress with functional goals. Pt sat EOB to participate in UB ADLs and grooming tasks. pt initially fearful during mobility to [facility] using RW, however pt became more confident and requested to ambulate a few feet in her room after toileting tasks. Pt very pleasant and cooperative. OT will continue to follow acutely Per RN, family requests orthostatics to be taken: Supine 160/62 Sitting 170/73 Standing 170/73  Follow Up Recommendations  SNF;Supervision/Assistance - 24 hour    Equipment Recommendations    TBD at next venue of care   Recommendations for Other Services      Precautions / Restrictions Precautions Precautions: Fall Restrictions Weight Bearing Restrictions: No       Mobility Bed Mobility Overal bed mobility: Needs Assistance Bed Mobility: Supine to Sit     Supine to sit: Min assist;HOB elevated Sit to supine: Min assist   General bed mobility comments: used rails, increased time  Transfers Overall transfer level: Needs assistance Equipment used: Rolling walker (2 wheeled) Transfers: Sit to/from Stand Sit to Stand: Mod assist Stand pivot transfers: Mod assist       General transfer comment: pt fearful, anxious    Balance Overall balance assessment: Needs assistance Sitting-balance support: Feet supported;Bilateral upper extremity supported Sitting balance-Leahy Scale: Fair   Postural control: Posterior lean Standing balance support:  Bilateral upper extremity supported;During functional activity Standing balance-Leahy Scale: Poor                             ADL either performed or assessed with clinical judgement   ADL Overall ADL's : Needs assistance/impaired     Grooming: Wash/dry hands;Wash/dry face;Brushing hair;Minimal assistance;Sitting   Upper Body Bathing: Moderate assistance;Sitting Upper Body Bathing Details (indicate cue type and reason): simulated Lower Body Bathing: Maximal assistance   Upper Body Dressing : Moderate assistance;Sitting   Lower Body Dressing: Total assistance   Toilet Transfer: Moderate assistance;Stand-pivot;BSC;RW;Cueing for safety;Cueing for sequencing   Toileting- Clothing Manipulation and Hygiene: Total assistance;Sit to/from stand       Functional mobility during ADLs: Moderate assistance;Cueing for safety;Rolling walker General ADL Comments: pt initially fearful during mobility to [facility] using RW, however pt became more confident and requested to ambulate a few feet in her room after toileting tasks     Vision Baseline Vision/History: Wears glasses Wears Glasses: At all times     Perception     Praxis      Cognition Arousal/Alertness: Awake/alert Behavior During Therapy: WFL for tasks assessed/performed Overall Cognitive Status: Impaired/Different from baseline Area of Impairment: Following commands;Safety/judgement;Problem solving;Awareness                       Following Commands: Follows one step commands consistently Safety/Judgement: Decreased awareness of safety   Problem Solving: Slow processing;Decreased initiation;Difficulty sequencing;Requires verbal cues;Requires tactile cues          Exercises     Shoulder Instructions  General Comments      Pertinent Vitals/ Pain       Pain Assessment: Faces Pain Score: 4  Pain Location: trunk with mobility Pain Descriptors / Indicators: Grimacing;Guarding Pain  Intervention(s): Monitored during session;Repositioned  Home Living                                          Prior Functioning/Environment              Frequency  Min 2X/week        Progress Toward Goals  OT Goals(current goals can now be found in the care plan section)  Progress towards OT goals: Progressing toward goals  ADL Goals Pt Will Perform Grooming: with min guard assist;with supervision;with set-up;sitting Pt Will Perform Upper Body Bathing: with min guard assist;with supervision;with set-up;sitting Pt Will Perform Lower Body Bathing: with mod assist;sitting/lateral leans Pt Will Perform Upper Body Dressing: with min guard assist;with supervision;with set-up;sitting Pt Will Transfer to Toilet: with min assist;ambulating Pt Will Perform Toileting - Clothing Manipulation and hygiene: with mod assist;sit to/from stand  Plan Discharge plan remains appropriate    Co-evaluation                 AM-PAC OT "6 Clicks" Daily Activity     Outcome Measure   Help from another person eating meals?: A Little Help from another person taking care of personal grooming?: A Little Help from another person toileting, which includes using toliet, bedpan, or urinal?: A Lot Help from another person bathing (including washing, rinsing, drying)?: A Lot Help from another person to put on and taking off regular upper body clothing?: A Little Help from another person to put on and taking off regular lower body clothing?: A Lot 6 Click Score: 15    End of Session Equipment Utilized During Treatment: Gait belt;Rolling walker;Other (comment)(BSC)  OT Visit Diagnosis: Unsteadiness on feet (R26.81);Muscle weakness (generalized) (M62.81)   Activity Tolerance Patient tolerated treatment well   Patient Left with call bell/phone within reach;with family/visitor present;in bed   Nurse Communication          Time: 8088-1103 OT Time Calculation (min): 46  min  Charges: OT General Charges $OT Visit: 1 Visit OT Treatments $Self Care/Home Management : 23-37 mins $Therapeutic Activity: 8-22 mins     Galen Manila 03/16/2018, 1:38 PM

## 2018-03-21 LAB — PTH-RELATED PEPTIDE: PTH-related peptide: <2 pmol/L

## 2018-03-22 ENCOUNTER — Other Ambulatory Visit: Payer: Self-pay | Admitting: *Deleted

## 2018-03-23 NOTE — Patient Outreach (Signed)
[facility]) Care Management  03/23/2018  BYRL BURKET 1927-03-20 [id number]   Facility site visit to [facility].  Collaboration with facility and [facility] UM team member.  Patient was admitted to facility on 03/16/18 after a brief hospitalization. PT reporting patient is walking 40 ft with rolling walker with minimal assistance.  PT reporting patient is mod assist with adls, toileting and transfers.  Nursing reports patient started a new inhaler (Breo) on 03/20/18.  Discharge plan is to return home with spouse and have HH ordered.  No discharge date set as of yet.   Visited patient at the bedside, she is currently in room 206.  Patient very pleasant alert and oriented.  Patient stated her spouse is her transportation.  She stated she and her spouse help each other with their medications.  She stated she did not usually use a pill organizer/box but with her new meds she may need to look into that. Patient stated she had been sick for a year getting weaker and weaker with weight loss.  She stated after her recent hospitalization she is feeling like she is on the road to recovery.  Patient stated her calcium was too high and her potassium and magnesium were too low.  Patient stated she felt she was making progress with therapy and is motivated to return home to her husband of 69years. Introduced Designer, fashion/clothing and gave [facility] Patient Packet with contact information included.  Patient accepted [facility] services.   Referral sent for [facility] RN [facility].  Plan to monitor for discharge.   Will Make [facility] UM team member aware of [facility] CM referral.  Garima Chronis RN, BSN [facility]  Post Acute Care Coordinator [phone]) Business Mobile [phone]) Toll free office

## 2018-03-30 ENCOUNTER — Other Ambulatory Visit: Payer: Self-pay | Admitting: *Deleted

## 2018-03-30 NOTE — Patient Outreach (Signed)
[facility]) Care Management  03/30/2018  KACEE GOOSTREE 1927-10-30 [id number]   Referral received from post acute care coordinator as member was recently in SNF for rehab due to a fall.  She was discharged on 2/6.  Per chart, she also has history of hypertension.  Call placed to member for transition of care.  Identity verified.  This care manager introduced self and stated purpose of call.  [facility] care management services explained.  She report she is doing well, denies the need for assistance.  Benefits of services explained, she again declines.  Will close case at this time, advised to contact [facility] in the further if needs change.  Contact information provided.  Kemper Durie, [place], MSN [facility] Care Management  [facility] Manager [phone]

## 2018-09-13 ENCOUNTER — Other Ambulatory Visit: Payer: Self-pay | Admitting: Family Medicine

## 2018-09-13 DIAGNOSIS — R209 Unspecified disturbances of skin sensation: Secondary | ICD-10-CM

## 2018-09-17 ENCOUNTER — Ambulatory Visit
Admission: RE | Admit: 2018-09-17 | Discharge: 2018-09-17 | Disposition: A | Discharge status: Home / Self Care | Payer: Medicare Other | Source: Ambulatory Visit | Attending: Family Medicine | Admitting: Family Medicine

## 2018-09-17 DIAGNOSIS — R209 Unspecified disturbances of skin sensation: Secondary | ICD-10-CM

## 2019-04-18 ENCOUNTER — Ambulatory Visit: Payer: Medicare Other | Attending: Internal Medicine

## 2019-04-18 DIAGNOSIS — Z23 Encounter for immunization: Secondary | ICD-10-CM

## 2019-04-18 NOTE — Progress Notes (Signed)
   Covid-19 Vaccination Clinic  Name:  Anne Bradshaw    MRN: [id number] DOB: December 17, 1927  04/18/2019  [name] was observed post Covid-19 immunization for 15 minutes without incidence. She was provided with Vaccine Information Sheet and instruction to access the V-Safe system.   [name] was instructed to call 911 with any severe reactions post vaccine: Marland Kitchen Difficulty breathing  . Swelling of your face and throat  . A fast heartbeat  . A bad rash all over your body  . Dizziness and weakness    Immunizations Administered    Name Date Dose VIS Date Route   Pfizer COVID-19 Vaccine 04/18/2019  1:41 PM 0.3 mL 02/01/2019 Intramuscular   Manufacturer: [employer], [employer]   Lot: [id number]   NDC: [address]-7

## 2019-05-10 ENCOUNTER — Emergency Department (HOSPITAL_COMMUNITY)
Admission: EM | Admit: 2019-05-10 | Discharge: 2019-05-10 | Disposition: A | Discharge status: Home / Self Care | Payer: Medicare Other | Attending: Emergency Medicine | Admitting: Emergency Medicine

## 2019-05-10 ENCOUNTER — Emergency Department (HOSPITAL_COMMUNITY): Payer: Medicare Other

## 2019-05-10 ENCOUNTER — Other Ambulatory Visit: Payer: Self-pay

## 2019-05-10 ENCOUNTER — Encounter (HOSPITAL_COMMUNITY): Payer: Self-pay | Admitting: Emergency Medicine

## 2019-05-10 DIAGNOSIS — I10 Essential (primary) hypertension: Secondary | ICD-10-CM | POA: Insufficient documentation

## 2019-05-10 DIAGNOSIS — M25512 Pain in left shoulder: Secondary | ICD-10-CM | POA: Diagnosis not present

## 2019-05-10 DIAGNOSIS — Z79899 Other long term (current) drug therapy: Secondary | ICD-10-CM | POA: Insufficient documentation

## 2019-05-10 DIAGNOSIS — J45909 Unspecified asthma, uncomplicated: Secondary | ICD-10-CM | POA: Insufficient documentation

## 2019-05-10 LAB — CBC
HCT: 39.5 % (ref 36.0–46.0)
Hemoglobin: 13 g/dL (ref 12.0–15.0)
MCH: 33.2 pg (ref 26.0–34.0)
MCHC: 32.9 g/dL (ref 30.0–36.0)
MCV: 101 fL — ABNORMAL HIGH (ref 80.0–100.0)
Platelets: 184 10*3/uL (ref 150–400)
RBC: 3.91 MIL/uL (ref 3.87–5.11)
RDW: 13.8 % (ref 11.5–15.5)
WBC: 5.8 10*3/uL (ref 4.0–10.5)
nRBC: 0 % (ref 0.0–0.2)

## 2019-05-10 LAB — TROPONIN I (HIGH SENSITIVITY)
Troponin I (High Sensitivity): 4 ng/L (ref <18)
Troponin I (High Sensitivity): 4 ng/L (ref <18)

## 2019-05-10 LAB — BASIC METABOLIC PANEL
Anion gap: 10 (ref 5–15)
BUN: 14 mg/dL (ref 8–23)
CO2: 23 mmol/L (ref 22–32)
Calcium: 9.5 mg/dL (ref 8.9–10.3)
Chloride: 105 mmol/L (ref 98–111)
Creatinine, Ser: 1.08 mg/dL — ABNORMAL HIGH (ref 0.44–1.00)
GFR calc Af Amer: 53 mL/min — ABNORMAL LOW (ref >60)
GFR calc non Af Amer: 45 mL/min — ABNORMAL LOW (ref >60)
Glucose, Bld: 109 mg/dL — ABNORMAL HIGH (ref 70–99)
Potassium: 4.2 mmol/L (ref 3.5–5.1)
Sodium: 138 mmol/L (ref 135–145)

## 2019-05-10 MED ORDER — SODIUM CHLORIDE 0.9% FLUSH: 3.0000 mL | Freq: Once | INTRAVENOUS | Status: DC

## 2019-05-10 MED ORDER — AMLODIPINE BESYLATE 5 MG PO TABS
5.0000 mg | ORAL_TABLET | Freq: Once | ORAL | Status: AC
  Administered 2019-05-10: 5 mg via ORAL
  Filled 2019-05-10: qty 1

## 2019-05-10 NOTE — ED Triage Notes (Signed)
Pt endorses left shoulder pain for a while that has progressed to her whole arm starting today. Denies CP, SOB. States she was sent to rule out cardiac but believes it might be arthritis.

## 2019-05-10 NOTE — ED Provider Notes (Signed)
[facility] EMERGENCY DEPARTMENT Provider Note   CSN: [id number] Arrival date & time: 05/10/19  1253     History Chief Complaint  Patient presents with  . Shoulder Pain    Anne Bradshaw is a 86 y.o. female.  HPI Patient is an 86 year old female with a PMH of hypertension, GERD and iritis presenting to the ED today due to left shoulder pain.  Patient says that she got up and was cooking breakfast when she began to notice shoulder pain.  She describes it as a sharp pain that radiates from her shoulder to her hand.  She denies any recent trauma or injuries to the area.  She says that the pain has begun to improve.  She has never experienced similar pain.  She denies chest pain, neck pain, shortness of breath, back pain, fever, chills, nausea, vomiting, diarrhea.    Past Medical History:  Diagnosis Date  . Asthma    as child  . GERD (gastroesophageal reflux disease)   . High blood pressure   . Iritis   . Reflux     Patient Active Problem List   Diagnosis Date Noted  . Hypercalcemia 03/11/2018  . Essential hypertension 09/27/2014  . 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 LYMPH NODE BIOPSY Right 07/29/2014   Procedure: RIGHT INGUINAL LYMPH NODE BIOPSY;  Surgeon: Stark Klein, MD;  Location: [facility];  Service: General;  Laterality: Right;  . NO PAST SURGERIES    . SKIN BIOPSY Right 07/29/2014   Procedure: SKIN PUNCH BIOPSY;  Surgeon: Stark Klein, MD;  Location: [facility];  Service: General;  Laterality: Right;     OB History   No obstetric history on file.     Family History  Problem Relation Age of Onset  . Congestive Heart Failure Father   . Congestive Heart Failure Brother   . Congestive Heart Failure Paternal Grandmother     Social History   Tobacco Use  . Smoking status: Never Smoker  . Smokeless tobacco: Never Used  Substance Use Topics  . Alcohol  use: No    Alcohol/week: 0.0 standard drinks  . Drug use: No    Home Medications Prior to Admission medications   Medication Sig Start Date End Date Taking? Authorizing Provider  acetaminophen (TYLENOL) 325 MG tablet Take 650 mg by mouth daily as needed for mild pain.    [provider]  amLODipine (NORVASC) 5 MG tablet Take 1 tablet (5 mg total) by mouth 2 (two) times daily. 03/16/18   Bonnell Public, MD  carvedilol (COREG) 3.125 MG tablet Take 1 tablet (3.125 mg total) by mouth 2 (two) times daily with a meal. 03/16/18   Bonnell Public, MD  famciclovir (FAMVIR) 500 MG tablet Take 500 mg by mouth daily.     [provider]  fluticasone furoate-vilanterol (BREO ELLIPTA) 100-25 MCG/INH AEPB Inhale 1 puff into the lungs daily. 03/17/18   Dana Allan I, MD  ipratropium-albuterol (DUONEB) 0.5-2.5 (3) MG/3ML SOLN Take 3 mLs by nebulization every 6 (six) hours as needed. 03/16/18   Dana Allan I, MD  lidocaine (LIDODERM) 5 % Place 1 patch onto the skin daily. Remove & Discard patch within 12 hours or as directed by MD 03/16/18   Bonnell Public, MD  Polyethyl Glycol-Propyl Glycol (SYSTANE) 0.4-0.3 % SOLN Apply to eye 4 (four) times daily as needed.    [provider]  prednisoLONE acetate (  PRED FORTE) 1 % ophthalmic suspension Place 1 drop into both eyes every other day.     [provider]  Probiotic Product (PRO-BIOTIC BLEND PO) Take by mouth every evening.    [provider]  umeclidinium bromide (INCRUSE ELLIPTA) 62.5 MCG/INH AEPB Inhale 1 puff into the lungs daily. 03/17/18   Barnetta Chapel, MD  vitamin B-12 (CYANOCOBALAMIN) 1000 MCG tablet Take 1,000 mcg by mouth daily.    [provider]    Allergies    Patient has no known allergies.  Review of Systems   Review of Systems  Constitutional: Negative for chills and fever.  HENT: Negative for rhinorrhea and sore throat.   Eyes: Negative for pain and visual  disturbance.  Respiratory: Negative for cough and shortness of breath.   Cardiovascular: Negative for chest pain and palpitations.  Gastrointestinal: Negative for abdominal pain, constipation, diarrhea, nausea and vomiting.  Genitourinary: Negative for decreased urine volume and hematuria.  Musculoskeletal: Positive for arthralgias. Negative for back pain and neck pain.  Skin: Negative for rash and wound.  Neurological: Negative for syncope, weakness, light-headedness and headaches.  Psychiatric/Behavioral: Negative for agitation.  All other systems reviewed and are negative.   Physical Exam Updated Vital Signs BP (!) 205/68   Pulse (!) 58   Temp 97.7 F (36.5 C) (Oral)   Resp 18   Ht 5\' 4"  (1.626 m)   Wt 54.4 kg   SpO2 98%   BMI 20.60 kg/m   Physical Exam Vitals and nursing note reviewed.  Constitutional:      General: She is not in acute distress.    Appearance: Normal appearance. She is well-developed and normal weight. She is not ill-appearing.  HENT:     Head: Normocephalic and atraumatic.     Right Ear: External ear normal.     Left Ear: External ear normal.     Nose: Nose normal. No congestion or rhinorrhea.     Mouth/Throat:     Mouth: Mucous membranes are moist.     Pharynx: Oropharynx is clear.  Eyes:     Extraocular Movements: Extraocular movements intact.     Pupils: Pupils are equal, round, and reactive to light.  Cardiovascular:     Rate and Rhythm: Normal rate and regular rhythm.     Pulses: Normal pulses.     Heart sounds: Normal heart sounds.  Pulmonary:     Effort: Pulmonary effort is normal. No respiratory distress.     Breath sounds: Normal breath sounds. No stridor. No wheezing, rhonchi or rales.  Abdominal:     General: There is no distension.     Palpations: Abdomen is soft.     Tenderness: There is no abdominal tenderness. There is no guarding or rebound.  Musculoskeletal:     Cervical back: Normal range of motion and neck supple. No  rigidity.     Comments: Patient has full active and passive range of motion of the left shoulder without pain.  No obvious bony deformities.  No tenderness palpation of the shoulder.  Radial pulses intact.  Skin:    General: Skin is warm and dry.     Capillary Refill: Capillary refill takes less than 2 seconds.  Neurological:     General: No focal deficit present.     Mental Status: She is alert and oriented to person, place, and time. Mental status is at baseline.  Psychiatric:        Mood and Affect: Mood normal.  ED Results / Procedures / Treatments   Labs (all labs ordered are listed, but only abnormal results are displayed) Labs Reviewed  BASIC METABOLIC PANEL - Abnormal; Notable for the following components:      Result Value   Glucose, Bld 109 (*)    Creatinine, Ser 1.08 (*)    GFR calc non Af Amer 45 (*)    GFR calc Af Amer 53 (*)    All other components within normal limits  CBC - Abnormal; Notable for the following components:   MCV 101.0 (*)    All other components within normal limits  TROPONIN I (HIGH SENSITIVITY)  TROPONIN I (HIGH SENSITIVITY)    EKG EKG Interpretation  Date/Time:  Friday May 10 2019 12:57:56 EDT Ventricular Rate:  55 PR Interval:  154 QRS Duration: 80 QT Interval:  408 QTC Calculation: 390 R Axis:   33 Text Interpretation: Sinus bradycardia Nonspecific T wave abnormality Confirmed by Cathren Laine [phone]) on 05/10/2019 3:07:34 PM   Radiology DG Chest 2 View  Result Date: 05/10/2019 CLINICAL DATA:  86 year old female with history of posterior left upper chest and left shoulder pain since this morning. EXAM: CHEST - 2 VIEW COMPARISON:  Chest x-ray 03/14/2018. FINDINGS: Lung volumes are normal. No consolidative airspace disease. No pleural effusions. No pneumothorax. No pulmonary nodule or mass noted. Bilateral apical pleuroparenchymal thickening and architectural distortion, most compatible with areas of chronic post infectious or  inflammatory scarring, similar to the prior examination. Pulmonary vasculature and the cardiomediastinal silhouette are within normal limits. Atherosclerosis in the thoracic aorta. IMPRESSION: 1. No radiographic evidence of acute cardiopulmonary disease. 2. Aortic atherosclerosis. Electronically Signed   By: Trudie Reed M.D.   On: 05/10/2019 13:29   DG Shoulder Left  Result Date: 05/10/2019 CLINICAL DATA:  Shoulder pain. Posterior LEFT UPPER chest and LEFT shoulder pain since this morning. No injury. EXAM: LEFT SHOULDER - 2+ VIEW COMPARISON:  None. FINDINGS: There is no evidence of fracture or dislocation. There is no evidence of arthropathy or other focal bone abnormality. Soft tissues are unremarkable. IMPRESSION: Negative. Electronically Signed   By: Norva Pavlov M.D.   On: 05/10/2019 13:34    Procedures Procedures (including critical care time)  Medications Ordered in ED Medications  amLODipine (NORVASC) tablet 5 mg (5 mg Oral Given 05/10/19 1630)    ED Course  I have reviewed the triage vital signs and the nursing notes.  Pertinent labs & imaging results that were available during my care of the patient were reviewed by me and considered in my medical decision making (see chart for details).    MDM Rules/Calculators/A&P                     Patient is an 86 year old female with a PMH of hypertension, GERD and iritis presenting to the ED today due to left shoulder pain.  Physical exam unremarkable.  BP 205/68, HR 58, RR 18, SPO2 98% on room air.  On arrival, patient appears generally well and is displaying no signs of acute distress.  Patient states that she was transferred here because EMS was concerned about atypical chest pain.  Patient only feels pain in her left arm and denies ever experiencing chest pain, neck pain or shortness of breath.  She says that her arm pain has actually improved and she says that she is ready to go home.  Lab work initiated in triage.  CBC within  normal limits.  BMP with BUN 14, creatinine 1.08 which  appears to be improved from baseline.  Serial troponins 4 and 4, consecutively.  EKG with sinus bradycardia (VR 55) with no signs of acute ischemia.  Chest x-ray showed no evidence of acute cardiac or pulmonary abnormalities.  X-ray of the left shoulder showed no acute fracture or dislocation.  Based off of work-up, low suspicion for ACS.  Patient potentially experiencing muscle strain in her arm.  Patient says that her arm pain has improved spontaneously and is requesting discharge.  At this time, we feel as though this is reasonable.  She continues to deny chest pain.  Patient persistently hypertensive while in the ED.  She says that she gets hypertensive when she is stressed and she is currently stressed because she is still in the emergency department.  She denies headache, chest pain, shortness of breath and she had no signs of ischemia on EKG, troponinemia or AKI to suggest hypertensive emergency.  Patient says that she took her home amlodipine this morning.  We will give her a second dose here in the ED.  No further work-up or intervention required while in the ED.  Strongly encouraged patient to follow-up with her PCP as soon as possible to discuss shoulder pain as well as hypertension.  Patient expresses understanding and is in agreement with plan.  Provided strict return precautions.  Patient stable at time of discharge.  Patient assessed and evaluated with Dr. Denton Lank.  Delray Alt, MD     Final Clinical Impression(s) / ED Diagnoses Final diagnoses:  Acute pain of left shoulder    Rx / DC Orders ED Discharge Orders    None       Delray Alt, MD 05/11/19 0111    Cathren Laine, MD 05/11/19 1556

## 2019-05-14 ENCOUNTER — Ambulatory Visit: Payer: Medicare Other | Attending: Internal Medicine

## 2019-05-14 DIAGNOSIS — Z23 Encounter for immunization: Secondary | ICD-10-CM

## 2019-05-14 NOTE — Progress Notes (Signed)
   Covid-19 Vaccination Clinic  Name:  Anne Bradshaw    MRN: [id number] DOB: November 26, 1927  05/14/2019  [name] was observed post Covid-19 immunization for 15 minutes without incident. She was provided with Vaccine Information Sheet and instruction to access the V-Safe system.   [name] was instructed to call 911 with any severe reactions post vaccine: Marland Kitchen Difficulty breathing  . Swelling of face and throat  . A fast heartbeat  . A bad rash all over body  . Dizziness and weakness   Immunizations Administered    Name Date Dose VIS Date Route   Pfizer COVID-19 Vaccine 05/14/2019 10:31 AM 0.3 mL 02/01/2019 Intramuscular   Manufacturer: [employer], [employer]   Lot: [id number]   NDC: [address]-5

## 2019-06-10 ENCOUNTER — Other Ambulatory Visit: Payer: Self-pay

## 2019-06-10 ENCOUNTER — Ambulatory Visit (INDEPENDENT_AMBULATORY_CARE_PROVIDER_SITE_OTHER): Payer: Medicare Other | Admitting: Ophthalmology

## 2019-06-10 ENCOUNTER — Encounter (INDEPENDENT_AMBULATORY_CARE_PROVIDER_SITE_OTHER): Payer: Self-pay | Admitting: Ophthalmology

## 2019-06-10 DIAGNOSIS — H35063 Retinal vasculitis, bilateral: Secondary | ICD-10-CM | POA: Insufficient documentation

## 2019-06-10 DIAGNOSIS — H35372 Puckering of macula, left eye: Secondary | ICD-10-CM

## 2019-06-10 DIAGNOSIS — B0232 Zoster iridocyclitis: Secondary | ICD-10-CM | POA: Insufficient documentation

## 2019-06-10 DIAGNOSIS — Z961 Presence of intraocular lens: Secondary | ICD-10-CM

## 2019-06-10 DIAGNOSIS — H35352 Cystoid macular degeneration, left eye: Secondary | ICD-10-CM

## 2019-06-10 NOTE — Progress Notes (Signed)
06/10/2019     CHIEF COMPLAINT Patient presents for Retina Follow Up   HISTORY OF PRESENT ILLNESS: Anne Bradshaw is a 86 y.o. female who presents to the clinic today for:   HPI    Retina Follow Up    Patient presents with  Dry AMD.  In both eyes.  Duration of 9 months.  Since onset it is stable.          Comments    9 month follow up - OCT OU, FP OU Patient denies change in vision and overall has no complaints.          Last edited by Hurman Horn, MD on 06/10/2019 11:33 AM. (History)      Referring physician: Alroy Dust, [address], [place],  [place] [address]  HISTORICAL INFORMATION:   Selected notes from the MEDICAL RECORD NUMBER       CURRENT MEDICATIONS: Current Outpatient Medications (Ophthalmic Drugs)  Medication Sig  . Polyethyl Glycol-Propyl Glycol (SYSTANE) 0.4-0.3 % SOLN Apply to eye 4 (four) times daily as needed.  . prednisoLONE acetate (PRED FORTE) 1 % ophthalmic suspension Place 1 drop into both eyes every other day.    No current facility-administered medications for this visit. (Ophthalmic Drugs)   Current Outpatient Medications (Other)  Medication Sig  . acetaminophen (TYLENOL) 325 MG tablet Take 650 mg by mouth daily as needed for mild pain.  Marland Kitchen amLODipine (NORVASC) 5 MG tablet Take 1 tablet (5 mg total) by mouth 2 (two) times daily.  . carvedilol (COREG) 3.125 MG tablet Take 1 tablet (3.125 mg total) by mouth 2 (two) times daily with a meal.  . famciclovir (FAMVIR) 500 MG tablet Take 500 mg by mouth daily.   . fluticasone furoate-vilanterol (BREO ELLIPTA) 100-25 MCG/INH AEPB Inhale 1 puff into the lungs daily.  Marland Kitchen ipratropium-albuterol (DUONEB) 0.5-2.5 (3) MG/3ML SOLN Take 3 mLs by nebulization every 6 (six) hours as needed.  . lidocaine (LIDODERM) 5 % Place 1 patch onto the skin daily. Remove & Discard patch within 12 hours or as directed by MD  . Probiotic Product (PRO-BIOTIC BLEND PO) Take by mouth every evening.   . umeclidinium bromide (INCRUSE ELLIPTA) 62.5 MCG/INH AEPB Inhale 1 puff into the lungs daily.  . vitamin B-12 (CYANOCOBALAMIN) 1000 MCG tablet Take 1,000 mcg by mouth daily.   No current facility-administered medications for this visit. (Other)      REVIEW OF SYSTEMS:    ALLERGIES No Known Allergies  PAST MEDICAL HISTORY Past Medical History:  Diagnosis Date  . Asthma    as child  . GERD (gastroesophageal reflux disease)   . High blood pressure   . Iritis   . Reflux    Past Surgical History:  Procedure Laterality Date  . LYMPH NODE BIOPSY Right 07/29/2014   Procedure: RIGHT INGUINAL LYMPH NODE BIOPSY;  Surgeon: Stark Klein, MD;  Location: [facility];  Service: General;  Laterality: Right;  . NO PAST SURGERIES    . SKIN BIOPSY Right 07/29/2014   Procedure: SKIN PUNCH BIOPSY;  Surgeon: Stark Klein, MD;  Location: [facility];  Service: General;  Laterality: Right;    FAMILY HISTORY Family History  Problem Relation Age of Onset  . Congestive Heart Failure Father   . Congestive Heart Failure Brother   . Congestive Heart Failure Paternal Grandmother     SOCIAL HISTORY Social History   Tobacco Use  . Smoking status: Never Smoker  . Smokeless  tobacco: Never Used  Substance Use Topics  . Alcohol use: No    Alcohol/week: 0.0 standard drinks  . Drug use: No         OPHTHALMIC EXAM: Base Eye Exam    Visual Acuity (Snellen - Linear)      Right Left   Dist cc 20/40-1 20/40-2   Dist ph cc 20/30-2 20/30-1   Correction: Glasses       Tonometry (Tonopen, 10:43 AM)      Right Left   Pressure 10 11       Pupils      Pupils Dark Light Shape React APD   Right PERRL 4 3 Round Slow None   Left PERRL 4 3 Round Slow None       Visual Fields (Counting fingers)      Left Right    Full Full       Extraocular Movement      Right Left    Full Full       Neuro/Psych    Oriented x3: Yes   Mood/Affect: Normal       Dilation     Both eyes: 1.0% Mydriacyl, 2.5% Phenylephrine @ 10:43 AM        Slit Lamp and Fundus Exam    External Exam      Right Left   External Normal Normal       Slit Lamp Exam      Right Left   Lids/Lashes Normal Normal   Conjunctiva/Sclera White and quiet White and quiet   Cornea Clear Clear   Anterior Chamber Deep and quiet Deep and quiet   Iris Round and reactive Round and reactive   Lens Posterior chamber intraocular lens Posterior chamber intraocular lens   Vitreous Normal Normal          IMAGING AND PROCEDURES  Imaging and Procedures for 06/10/19           ASSESSMENT/PLAN:  No problem-specific Assessment & Plan notes found for this encounter.      ICD-10-CM   1. 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  2.  3.  Ophthalmic Meds Ordered this visit:  No orders of the defined types were placed in this encounter.      No follow-ups on file.  There are no Patient Instructions on file for this visit.   Explained the diagnoses, plan, and follow up with the patient and they expressed understanding.  Patient expressed understanding of the importance of proper follow up care.   Alford Highland Elma Shands M.D. Diseases & Surgery of the Retina and Vitreous [facility] 06/10/19     Abbreviations: M myopia (nearsighted); A astigmatism; H hyperopia (farsighted); P presbyopia; Mrx spectacle prescription;  CTL contact lenses; OD right eye; OS left eye; OU both eyes  XT exotropia; ET esotropia; PEK punctate epithelial keratitis; PEE punctate epithelial erosions; DES dry eye syndrome; MGD meibomian gland dysfunction; ATs artificial tears; PFAT's preservative free artificial tears; NSC nuclear sclerotic cataract; PSC posterior subcapsular cataract; ERM epi-retinal membrane; PVD posterior vitreous detachment; RD retinal detachment; DM diabetes  mellitus; DR diabetic retinopathy; NPDR non-proliferative diabetic retinopathy; PDR proliferative diabetic retinopathy; CSME clinically significant macular edema; DME diabetic macular edema; dbh dot blot hemorrhages; CWS cotton wool spot; POAG primary open angle glaucoma; C/D cup-to-disc ratio; HVF humphrey visual field;  GVF goldmann visual field; OCT optical coherence tomography; IOP intraocular pressure; BRVO Branch retinal vein occlusion; CRVO central retinal vein occlusion; CRAO central retinal artery occlusion; BRAO branch retinal artery occlusion; RT retinal tear; SB scleral buckle; PPV pars plana vitrectomy; VH Vitreous hemorrhage; PRP panretinal laser photocoagulation; IVK intravitreal kenalog; VMT vitreomacular traction; MH Macular hole;  NVD neovascularization of the disc; NVE neovascularization elsewhere; AREDS age related eye disease study; ARMD age related macular degeneration; POAG primary open angle glaucoma; EBMD epithelial/anterior basement membrane dystrophy; ACIOL anterior chamber intraocular lens; IOL intraocular lens; PCIOL posterior chamber intraocular lens; Phaco/IOL phacoemulsification with intraocular lens placement; [place] photorefractive keratectomy; LASIK laser assisted in situ keratomileusis; HTN hypertension; DM diabetes mellitus; COPD chronic obstructive pulmonary disease

## 2019-06-10 NOTE — Assessment & Plan Note (Signed)
The nature of macular pucker (epiretinal membrane ERM) was discussed with the patient as well as threshold criteria for vitrectomy surgery. I explained that in rare cases another surgery is needed to actually remove a second wrinkle should it regrow.  Most often, the epiretinal membrane and underlying wrinkled internal limiting membrane are removed with the first surgery, to accomplish the goals.   If the operative eye is Phak[place] (natural lens still present), cataract surgery is often recommended prior to Vitrectomy. This will enable the retina surgeon to have the best view during surgery and the patient to obtain optimal results in the future. Treatment options were discussed.  OS, monitor, observe

## 2019-06-10 NOTE — Patient Instructions (Signed)
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   OS this is chronic from prior retinal vascular disease and herpes zoster retinal vasculitis related.  Overall this is stable if not improved as she continues on chronic suppressive Famvir, for life

## 2019-09-10 ENCOUNTER — Other Ambulatory Visit (INDEPENDENT_AMBULATORY_CARE_PROVIDER_SITE_OTHER): Payer: Self-pay | Admitting: Ophthalmology

## 2019-11-26 ENCOUNTER — Other Ambulatory Visit: Payer: Self-pay | Admitting: Family Medicine

## 2019-11-26 ENCOUNTER — Ambulatory Visit
Admission: RE | Admit: 2019-11-26 | Discharge: 2019-11-26 | Disposition: A | Discharge status: Home / Self Care | Payer: Medicare Other | Source: Ambulatory Visit | Attending: Family Medicine | Admitting: Family Medicine

## 2019-11-26 DIAGNOSIS — R0600 Dyspnea, unspecified: Secondary | ICD-10-CM

## 2019-12-07 ENCOUNTER — Ambulatory Visit: Payer: Medicare Other | Attending: Internal Medicine

## 2019-12-07 DIAGNOSIS — Z23 Encounter for immunization: Secondary | ICD-10-CM

## 2019-12-07 NOTE — Progress Notes (Signed)
   Covid-19 Vaccination Clinic  Name:  ESSANCE GATTI    MRN: [id number] DOB: 05-22-1927  12/07/2019  Anne Bradshaw was observed post Covid-19 immunization for 15 minutes without incident. She was provided with Vaccine Information Sheet and instruction to access the V-Safe system.   Ms. Aylesworth was instructed to call 911 with any severe reactions post vaccine: Marland Kitchen Difficulty breathing  . Swelling of face and throat  . A fast heartbeat  . A bad rash all over body  . Dizziness and weakness

## 2020-03-11 ENCOUNTER — Encounter (INDEPENDENT_AMBULATORY_CARE_PROVIDER_SITE_OTHER): Payer: Medicare Other | Admitting: Ophthalmology

## 2020-03-19 ENCOUNTER — Other Ambulatory Visit (INDEPENDENT_AMBULATORY_CARE_PROVIDER_SITE_OTHER): Payer: Self-pay | Admitting: Ophthalmology

## 2020-03-23 ENCOUNTER — Encounter (INDEPENDENT_AMBULATORY_CARE_PROVIDER_SITE_OTHER): Payer: Self-pay | Admitting: Ophthalmology

## 2020-03-23 ENCOUNTER — Other Ambulatory Visit: Payer: Self-pay

## 2020-03-23 ENCOUNTER — Ambulatory Visit (INDEPENDENT_AMBULATORY_CARE_PROVIDER_SITE_OTHER): Payer: Medicare Other | Admitting: Ophthalmology

## 2020-03-23 DIAGNOSIS — H35352 Cystoid macular degeneration, left eye: Secondary | ICD-10-CM

## 2020-03-23 DIAGNOSIS — B0232 Zoster iridocyclitis: Secondary | ICD-10-CM

## 2020-03-23 DIAGNOSIS — H35063 Retinal vasculitis, bilateral: Secondary | ICD-10-CM | POA: Diagnosis not present

## 2020-03-23 NOTE — Progress Notes (Signed)
03/23/2020     CHIEF COMPLAINT Patient presents for Retina Follow Up (9 MO FU OU///Pt reports stable vision OU. Pt denies any new F/F, pain, or pressure OU. )   HISTORY OF PRESENT ILLNESS: Anne Bradshaw is a 87 y.o. female who presents to the clinic today for:   HPI    Retina Follow Up    Patient presents with  Other.  In both eyes.  This started 9 months ago.  Duration of 9 months.  Since onset it is stable. Additional comments: 9 MO FU OU   Pt reports stable vision OU. Pt denies any new F/F, pain, or pressure OU.        Last edited by Varney Biles D on 03/23/2020  1:31 PM. (History)      Referring physician: Clovis Riley, L.August Saucer, MD [address] [place],  [place] [address]  HISTORICAL INFORMATION:   Selected notes from the MEDICAL RECORD NUMBER       CURRENT MEDICATIONS: Current Outpatient Medications (Ophthalmic Drugs)  Medication Sig  . Polyethyl Glycol-Propyl Glycol 0.4-0.3 % SOLN Apply to eye 4 (four) times daily as needed.  . prednisoLONE acetate (PRED FORTE) 1 % ophthalmic suspension 1 DROP IN BOTH EYES EVERY OTHER DAY   No current facility-administered medications for this visit. (Ophthalmic Drugs)   Current Outpatient Medications (Other)  Medication Sig  . acetaminophen (TYLENOL) 325 MG tablet Take 650 mg by mouth daily as needed for mild pain.  Marland Kitchen amLODipine (NORVASC) 5 MG tablet Take 1 tablet (5 mg total) by mouth 2 (two) times daily.  . carvedilol (COREG) 3.125 MG tablet Take 1 tablet (3.125 mg total) by mouth 2 (two) times daily with a meal.  . famciclovir (FAMVIR) 500 MG tablet TAKE 1 TABLET EVERY DAY  . fluticasone furoate-vilanterol (BREO ELLIPTA) 100-25 MCG/INH AEPB Inhale 1 puff into the lungs daily.  Marland Kitchen ipratropium-albuterol (DUONEB) 0.5-2.5 (3) MG/3ML SOLN Take 3 mLs by nebulization every 6 (six) hours as needed.  . lidocaine (LIDODERM) 5 % Place 1 patch onto the skin daily. Remove & Discard patch within 12 hours or as directed by  MD  . Probiotic Product (PRO-BIOTIC BLEND PO) Take by mouth every evening.  . umeclidinium bromide (INCRUSE ELLIPTA) 62.5 MCG/INH AEPB Inhale 1 puff into the lungs daily.  . vitamin B-12 (CYANOCOBALAMIN) 1000 MCG tablet Take 1,000 mcg by mouth daily.   No current facility-administered medications for this visit. (Other)      REVIEW OF SYSTEMS:    ALLERGIES No Known Allergies  PAST MEDICAL HISTORY Past Medical History:  Diagnosis Date  . Asthma    as child  . GERD (gastroesophageal reflux disease)   . High blood pressure   . Iritis   . Reflux    Past Surgical History:  Procedure Laterality Date  . LYMPH NODE BIOPSY Right 07/29/2014   Procedure: RIGHT INGUINAL LYMPH NODE BIOPSY;  Surgeon: [name], MD;  Location: [facility];  Service: General;  Laterality: Right;  . NO PAST SURGERIES    . SKIN BIOPSY Right 07/29/2014   Procedure: SKIN PUNCH BIOPSY;  Surgeon: [name], MD;  Location: [facility];  Service: General;  Laterality: Right;    FAMILY HISTORY Family History  Problem Relation Age of Onset  . Congestive Heart Failure Father   . Congestive Heart Failure Brother   . Congestive Heart Failure Paternal Grandmother     SOCIAL HISTORY Social History   Tobacco Use  . Smoking  status: Never Smoker  . Smokeless tobacco: Never Used  Vaping Use  . Vaping Use: Never used  Substance Use Topics  . Alcohol use: No    Alcohol/week: 0.0 standard drinks  . Drug use: No         OPHTHALMIC EXAM:  Base Eye Exam    Visual Acuity (ETDRS)      Right Left   Dist cc 20/40 +2 20/30 -1   Dist ph cc 20/25 -1 20/25 -2   Correction: Glasses       Tonometry (Tonopen, 1:38 PM)      Right Left   Pressure 8 9       Pupils      Pupils Dark Light Shape React APD   Right PERRL 4 3 Round Slow None   Left PERRL 4 3 Round Slow None       Visual Fields (Counting fingers)      Left Right    Full Full       Extraocular Movement       Right Left    Full Full       Neuro/Psych    Oriented x3: Yes   Mood/Affect: Normal       Dilation    Both eyes: 1.0% Mydriacyl, 2.5% Phenylephrine @ 1:38 PM        Slit Lamp and Fundus Exam    External Exam      Right Left   External Normal Normal       Slit Lamp Exam      Right Left   Lids/Lashes Normal Normal   Conjunctiva/Sclera White and quiet White and quiet   Cornea Clear Clear   Anterior Chamber Deep and quiet Deep and quiet   Iris Round and reactive Round and reactive   Lens Posterior chamber intraocular lens Posterior chamber intraocular lens   Anterior Vitreous Normal, clear no cells Normal clear no cells       Fundus Exam      Right Left   Posterior Vitreous Posterior vitreous detachment Posterior vitreous detachment no cellular debris   Disc Normal Normal   C/D Ratio 0.25 0.25   Macula Normal Normal   Vessels No active vasculitis No active vasculitis, area of perivascular chorioretinal scarring 2:00 peripherally and anteriorly as well as whitening of retinal vessels centered at 8:00 anteriorly no active inflammation   Periphery Normal Normal          IMAGING AND PROCEDURES  Imaging and Procedures for 03/23/20  OCT, Retina - OU - Both Eyes       Right Eye Quality was good. Scan locations included subfoveal. Central Foveal Thickness: 256. Progression has been stable. Findings include normal observations.   Left Eye Quality was good. Central Foveal Thickness: 246. Progression has been stable. Findings include abnormal foveal contour, outer retinal atrophy.        Color Fundus Photography Optos - OU - Both Eyes       Right Eye Progression has been stable. Disc findings include normal observations. Macula : normal observations. Periphery : normal observations.   Left Eye Progression has been stable. Disc findings include normal observations. Macula : normal observations. Vessels : attenuated. Periphery : normal observations.   Notes No  active retinal vasculitis nor chorioretinitis nor retinitis  OS with peripheral whitening of retinal vessels at the 8 o'clock position anteriorly.  This is an area of previous apparent vasculitis As well as superotemporal at the 2 o'clock position.  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  This is an area of previous apparent vasculitis As well as superotemporal at the 2 o'clock position.  These areas have remained quiet now for some years on chronic suppressive Famvir       ICD-10-CM   1. Retinal vasculitis of both eyes  H35.063 OCT, Retina - OU - Both Eyes    Color Fundus Photography Optos - OU - Both Eyes  2. Cystoid macular edema, left  H35.352   3. 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  Continue on oral famciclovir 500 mg once daily for chronic suppressive dosing  2.  3.  Ophthalmic Meds Ordered this visit:  No orders of the defined types were placed in this encounter.      Return in about 7 months (around 10/21/2020) for DILATE OU, COLOR FP, OCT.  Patient Instructions  Patient instructed to continue with chronic suppressive oral Famvir to prevent recurrence of ARN type retinal vasculitis present in each eye with chronic iridocyclitis some years past.  No signs of recurrences nor progression of reactivation of disease has occurred while using this medication we will continue to follow    Explained the diagnoses, plan, and follow up with the patient and they expressed understanding.  Patient expressed understanding of the importance of proper follow  up care.   Alford Highland Nima Kemppainen M.D. Diseases & Surgery of the Retina and Vitreous [facility] 03/23/20     Abbreviations: M myopia (nearsighted); A astigmatism; H hyperopia (farsighted); P presbyopia; Mrx spectacle prescription;  CTL contact lenses; OD right eye; OS left eye; OU both eyes  XT exotropia; ET esotropia; PEK punctate epithelial keratitis; PEE punctate epithelial erosions; DES dry eye syndrome; MGD meibomian gland dysfunction; ATs artificial tears; PFAT's preservative free artificial tears; NSC nuclear sclerotic cataract; PSC posterior subcapsular cataract; ERM epi-retinal membrane; PVD posterior vitreous detachment; RD retinal detachment; DM diabetes mellitus; DR diabetic retinopathy; NPDR non-proliferative diabetic retinopathy; PDR proliferative diabetic retinopathy; CSME clinically significant macular edema; DME diabetic macular edema; dbh dot blot hemorrhages; CWS cotton wool spot; POAG primary open angle glaucoma; C/D cup-to-disc ratio; HVF humphrey visual field; GVF goldmann visual field; OCT optical coherence tomography; IOP intraocular pressure; BRVO Branch retinal vein occlusion; CRVO central retinal vein occlusion; CRAO central retinal artery occlusion; BRAO branch retinal artery occlusion; RT retinal tear; SB scleral buckle; PPV pars plana vitrectomy; VH Vitreous hemorrhage; PRP panretinal laser photocoagulation; IVK intravitreal kenalog; VMT vitreomacular traction; MH Macular hole;  NVD neovascularization of the disc; NVE neovascularization elsewhere; AREDS age related eye disease study; ARMD age related macular degeneration; POAG primary open angle glaucoma; EBMD epithelial/anterior basement membrane dystrophy; ACIOL anterior chamber intraocular lens; IOL intraocular lens; PCIOL posterior chamber intraocular lens; Phaco/IOL phacoemulsification with intraocular lens placement; PRK photorefractive keratectomy; LASIK laser assisted in situ keratomileusis; HTN  hypertension; DM diabetes mellitus; COPD chronic obstructive pulmonary disease

## 2020-03-23 NOTE — Assessment & Plan Note (Signed)
No signs of recurrence of inflammation stable on chronic suppressive Famvir

## 2020-03-23 NOTE — Assessment & Plan Note (Signed)
Resolved OS on current OCT findings and stable patient has continued to use Famvir daily

## 2020-03-23 NOTE — Assessment & Plan Note (Signed)
OS with peripheral whitening of retinal vessels at the 8 o'clock position anteriorly.  This is an area of previous apparent vasculitis As well as superotemporal at the 2 o'clock position.  These areas have remained quiet now for some years on chronic suppressive Famvir

## 2020-03-23 NOTE — Patient Instructions (Signed)
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  No signs of recurrences nor progression of reactivation of disease has occurred while using this medication we will continue to follow

## 2020-10-27 ENCOUNTER — Other Ambulatory Visit: Payer: Self-pay

## 2020-10-27 ENCOUNTER — Ambulatory Visit (INDEPENDENT_AMBULATORY_CARE_PROVIDER_SITE_OTHER): Payer: Medicare Other | Admitting: Ophthalmology

## 2020-10-27 ENCOUNTER — Encounter (INDEPENDENT_AMBULATORY_CARE_PROVIDER_SITE_OTHER): Payer: Self-pay | Admitting: Ophthalmology

## 2020-10-27 DIAGNOSIS — H35372 Puckering of macula, left eye: Secondary | ICD-10-CM

## 2020-10-27 DIAGNOSIS — B0232 Zoster iridocyclitis: Secondary | ICD-10-CM | POA: Diagnosis not present

## 2020-10-27 DIAGNOSIS — H35352 Cystoid macular degeneration, left eye: Secondary | ICD-10-CM

## 2020-10-27 DIAGNOSIS — H35063 Retinal vasculitis, bilateral: Secondary | ICD-10-CM | POA: Diagnosis not present

## 2020-10-27 NOTE — Assessment & Plan Note (Signed)
No recurrence on Famvir

## 2020-10-27 NOTE — Assessment & Plan Note (Signed)
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

## 2020-10-27 NOTE — Patient Instructions (Signed)
Patient instructed to continue on Famvir 500 mg 1 tablet daily  Patient also directed to use topical prednisolone acetate 1 drop each eye every other day

## 2020-10-27 NOTE — Progress Notes (Signed)
10/27/2020     CHIEF COMPLAINT Patient presents for  Chief Complaint  Patient presents with   Retina Follow Up      HISTORY OF PRESENT ILLNESS: Anne Bradshaw is a 86 y.o. female who presents to the clinic today for:   HPI     Retina Follow Up   Patient presents with  Other.  In both eyes.  This started 7 months ago.  Severity is mild.  Duration of 7 months.  Since onset it is stable.        Comments   7 month fu OU and OCT/FP Pt states VA OU stable since last visit. Pt denies FOL, floaters, or ocular pain OU.  Pt reports using Prednisolone QOD OU        Last edited by Demetrios Loll, COA on 10/27/2020 12:54 PM.      Referring physician: Clovis Riley, L.August Saucer, MD [address] [place],  [place] [address]  HISTORICAL INFORMATION:   Selected notes from the MEDICAL RECORD NUMBER       CURRENT MEDICATIONS: Current Outpatient Medications (Ophthalmic Drugs)  Medication Sig   Polyethyl Glycol-Propyl Glycol 0.4-0.3 % SOLN Apply to eye 4 (four) times daily as needed.   prednisoLONE acetate (PRED FORTE) 1 % ophthalmic suspension 1 DROP IN BOTH EYES EVERY OTHER DAY   No current facility-administered medications for this visit. (Ophthalmic Drugs)   Current Outpatient Medications (Other)  Medication Sig   acetaminophen (TYLENOL) 325 MG tablet Take 650 mg by mouth daily as needed for mild pain.   amLODipine (NORVASC) 5 MG tablet Take 1 tablet (5 mg total) by mouth 2 (two) times daily.   carvedilol (COREG) 3.125 MG tablet Take 1 tablet (3.125 mg total) by mouth 2 (two) times daily with a meal.   famciclovir (FAMVIR) 500 MG tablet TAKE 1 TABLET EVERY DAY   fluticasone furoate-vilanterol (BREO ELLIPTA) 100-25 MCG/INH AEPB Inhale 1 puff into the lungs daily.   ipratropium-albuterol (DUONEB) 0.5-2.5 (3) MG/3ML SOLN Take 3 mLs by nebulization every 6 (six) hours as needed.   lidocaine (LIDODERM) 5 % Place 1 patch onto the skin daily. Remove & Discard patch within 12  hours or as directed by MD   Probiotic Product (PRO-BIOTIC BLEND PO) Take by mouth every evening.   umeclidinium bromide (INCRUSE ELLIPTA) 62.5 MCG/INH AEPB Inhale 1 puff into the lungs daily.   vitamin B-12 (CYANOCOBALAMIN) 1000 MCG tablet Take 1,000 mcg by mouth daily.   No current facility-administered medications for this visit. (Other)      REVIEW OF SYSTEMS:    ALLERGIES No Known Allergies  PAST MEDICAL HISTORY Past Medical History:  Diagnosis Date   Asthma    as child   GERD (gastroesophageal reflux disease)    High blood pressure    Iritis    Reflux    Past Surgical History:  Procedure Laterality Date   LYMPH NODE BIOPSY Right 07/29/2014   Procedure: RIGHT INGUINAL LYMPH NODE BIOPSY;  Surgeon: [name], MD;  Location: [facility];  Service: General;  Laterality: Right;   NO PAST SURGERIES     SKIN BIOPSY Right 07/29/2014   Procedure: SKIN PUNCH BIOPSY;  Surgeon: [name], MD;  Location: [facility];  Service: General;  Laterality: Right;    FAMILY HISTORY Family History  Problem Relation Age of Onset   Congestive Heart Failure Father    Congestive Heart Failure Brother    Congestive Heart Failure Paternal Grandmother  SOCIAL HISTORY Social History   Tobacco Use   Smoking status: Never   Smokeless tobacco: Never  Vaping Use   Vaping Use: Never used  Substance Use Topics   Alcohol use: No    Alcohol/week: 0.0 standard drinks   Drug use: No         OPHTHALMIC EXAM:  Base Eye Exam     Visual Acuity (ETDRS)       Right Left   Dist cc 20/40 20/50   Dist ph cc 20/30 -1 20/30    Correction: Glasses         Tonometry (Tonopen, 12:59 PM)       Right Left   Pressure 09 10         Pupils       Pupils Dark Light Shape React APD   Right PERRL 4 3 Round Slow None   Left PERRL 4 3 Round Slow None         Visual Fields (Counting fingers)       Left Right    Full Full         Extraocular  Movement       Right Left    Full Full         Neuro/Psych     Oriented x3: Yes   Mood/Affect: Normal         Dilation     Both eyes: 1.0% Mydriacyl, 2.5% Phenylephrine @ 12:59 PM           Slit Lamp and Fundus Exam     External Exam       Right Left   External Normal Normal         Slit Lamp Exam       Right Left   Lids/Lashes Normal Normal   Conjunctiva/Sclera White and quiet White and quiet   Cornea Clear Clear   Anterior Chamber Deep and quiet Deep and quiet   Iris Round and reactive Round and reactive   Lens Posterior chamber intraocular lens Posterior chamber intraocular lens   Anterior Vitreous Normal, clear no cells Normal clear no cells         Fundus Exam       Right Left   Posterior Vitreous Posterior vitreous detachment Posterior vitreous detachment no cellular debris   Disc Normal Normal   C/D Ratio 0.25 0.25   Macula Normal Normal   Vessels No active vasculitis No active vasculitis, area of perivascular chorioretinal scarring 2:00 peripherally and anteriorly as well as whitening of retinal vessels centered at 8:00 anteriorly no active inflammation   Periphery Normal, no retinal breaks Normal, no retinal breaks            IMAGING AND PROCEDURES  Imaging and Procedures for 10/27/20  OCT, Retina - OU - Both Eyes       Right Eye Quality was good. Scan locations included subfoveal. Central Foveal Thickness: 260. Progression has been stable. Findings include normal observations.   Left Eye Quality was good. Scan locations included subfoveal. Central Foveal Thickness: 248. Progression has been stable. Findings include abnormal foveal contour, outer retinal atrophy.   Notes No active maculopathy     Color Fundus Photography Optos - OU - Both Eyes       Right Eye Progression has been stable. Disc findings include normal observations. Macula : normal observations. Vessels : attenuated. Periphery : normal observations.   Left  Eye Progression has been stable. Disc findings include normal observations. Macula :  normal observations. Vessels : attenuated. Periphery : normal observations.   Notes No active retinal vasculitis nor chorioretinitis nor retinitis  OS with peripheral whitening of retinal vessels at the 8 o'clock position anteriorly.  This is an area of previous apparent vasculitis As well as superotemporal at the 2 o'clock position.  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 Retinal vasculitis of both eyes  H35.063 OCT, Retina - OU - Both Eyes    Color Fundus Photography Optos - OU - Both Eyes    2. Left epiretinal membrane  H35.372     3. 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 Cystoid macular edema, left  H35.352       1.  OU with no sign of recurrence of retinal vasculitis.  2.  History of herpes zoster iridocyclitis, patient continues on topical prednisolone acetate once every other day  3.  Ophthalmic Meds Ordered this visit:  No orders of the defined types were placed in this encounter.      Return in about 9 months (around 07/27/2021) for DILATE OU, COLOR FP, OCT.  Patient Instructions  Patient instructed to continue on Famvir 500 mg 1 tablet daily  Patient also directed to use topical prednisolone acetate 1 drop each eye every other day   Explained the diagnoses, plan, and follow up with the patient and they expressed understanding.  Patient expressed understanding of the importance of proper follow up care.   Alford Highland Oval Cavazos M.D. Diseases & Surgery of the Retina and Vitreous [facility] 10/27/20     Abbreviations: M myopia (nearsighted); A astigmatism; H hyperopia (farsighted); P presbyopia; Mrx spectacle prescription;  CTL contact lenses; OD right eye; OS left eye; OU both eyes  XT exotropia; ET esotropia; PEK punctate epithelial keratitis; PEE punctate epithelial erosions; DES dry eye syndrome; MGD meibomian gland dysfunction; ATs artificial tears; PFAT's preservative free artificial tears; NSC nuclear sclerotic cataract; PSC posterior subcapsular cataract; ERM epi-retinal membrane; PVD posterior vitreous detachment; RD retinal detachment; DM diabetes mellitus; DR diabetic retinopathy; NPDR non-proliferative diabetic retinopathy; PDR proliferative diabetic retinopathy; CSME clinically significant macular edema; DME diabetic macular edema; dbh dot blot hemorrhages; CWS cotton wool spot; POAG primary open angle glaucoma; C/D cup-to-disc ratio; HVF humphrey visual field; GVF goldmann visual field; OCT optical coherence tomography; IOP intraocular pressure; BRVO Branch retinal vein occlusion; CRVO central retinal vein occlusion; CRAO central retinal artery occlusion; BRAO branch retinal artery occlusion; RT retinal tear; SB scleral buckle; PPV pars plana vitrectomy; VH Vitreous hemorrhage; PRP panretinal laser photocoagulation; IVK intravitreal kenalog; VMT vitreomacular traction; MH Macular hole;  NVD neovascularization of the disc; NVE neovascularization elsewhere; AREDS age related eye disease study; ARMD age related macular degeneration; POAG primary open angle glaucoma; EBMD epithelial/anterior basement membrane dystrophy; ACIOL anterior chamber intraocular lens; IOL intraocular lens; PCIOL posterior chamber intraocular lens; Phaco/IOL phacoemulsification with intraocular lens placement; PRK photorefractive keratectomy; LASIK laser assisted in situ keratomileusis; HTN hypertension; DM diabetes mellitus; COPD chronic obstructive pulmonary disease

## 2020-10-27 NOTE — Assessment & Plan Note (Signed)
No recurrence since use of chronic suppressive Famvir

## 2020-10-28 ENCOUNTER — Other Ambulatory Visit (INDEPENDENT_AMBULATORY_CARE_PROVIDER_SITE_OTHER): Payer: Self-pay | Admitting: Ophthalmology

## 2021-02-18 ENCOUNTER — Other Ambulatory Visit: Payer: Self-pay | Admitting: Physician Assistant

## 2021-02-18 DIAGNOSIS — R519 Headache, unspecified: Secondary | ICD-10-CM

## 2021-03-15 ENCOUNTER — Ambulatory Visit
Admission: RE | Admit: 2021-03-15 | Discharge: 2021-03-15 | Disposition: A | Discharge status: Home / Self Care | Payer: Medicare Other | Source: Ambulatory Visit | Attending: Physician Assistant | Admitting: Physician Assistant

## 2021-03-15 DIAGNOSIS — R519 Headache, unspecified: Secondary | ICD-10-CM

## 2021-03-22 ENCOUNTER — Other Ambulatory Visit (INDEPENDENT_AMBULATORY_CARE_PROVIDER_SITE_OTHER): Payer: Self-pay | Admitting: Ophthalmology

## 2021-07-27 ENCOUNTER — Ambulatory Visit (INDEPENDENT_AMBULATORY_CARE_PROVIDER_SITE_OTHER): Payer: Medicare Other | Admitting: Ophthalmology

## 2021-07-27 ENCOUNTER — Encounter (INDEPENDENT_AMBULATORY_CARE_PROVIDER_SITE_OTHER): Payer: Self-pay | Admitting: Ophthalmology

## 2021-07-27 DIAGNOSIS — H35063 Retinal vasculitis, bilateral: Secondary | ICD-10-CM | POA: Diagnosis not present

## 2021-07-27 DIAGNOSIS — B0232 Zoster iridocyclitis: Secondary | ICD-10-CM | POA: Diagnosis not present

## 2021-07-27 DIAGNOSIS — H35372 Puckering of macula, left eye: Secondary | ICD-10-CM

## 2021-07-27 MED ORDER — PREDNISOLONE ACETATE 1 % OP SUSP: OPHTHALMIC | 6 refills | Status: AC

## 2021-07-27 NOTE — Assessment & Plan Note (Signed)
Not clinically significant

## 2021-07-27 NOTE — Assessment & Plan Note (Signed)
Doing well OU, no recurrences

## 2021-07-27 NOTE — Progress Notes (Signed)
07/27/2021     CHIEF COMPLAINT Patient presents for  Chief Complaint  Patient presents with   Retina Follow Up      HISTORY OF PRESENT ILLNESS: Anne Bradshaw is a 88 y.o. female who presents to the clinic today for:   HPI     Retina Follow Up           Diagnosis: Other   Laterality: both eyes   Severity: moderate   Course: stable         Comments   9 MOS FU OU OCT FP. Pt stated vision has been stable since last visit. Pt denies new floaters and FOL. Pt is currently still taking PRED FORTE- 1 drop into both eyes daily.       Last edited by Angeline Slim on 07/27/2021 12:58 PM.      Referring physician: Clovis Riley, L.August Saucer, MD [address] [place],  [place] [address]  HISTORICAL INFORMATION:   Selected notes from the MEDICAL RECORD NUMBER       CURRENT MEDICATIONS: Current Outpatient Medications (Ophthalmic Drugs)  Medication Sig   Polyethyl Glycol-Propyl Glycol 0.4-0.3 % SOLN Apply to eye 4 (four) times daily as needed.   prednisoLONE acetate (PRED FORTE) 1 % ophthalmic suspension 1 DROP IN BOTH EYES EVERY OTHER DAY   No current facility-administered medications for this visit. (Ophthalmic Drugs)   Current Outpatient Medications (Other)  Medication Sig   acetaminophen (TYLENOL) 325 MG tablet Take 650 mg by mouth daily as needed for mild pain.   amLODipine (NORVASC) 5 MG tablet Take 1 tablet (5 mg total) by mouth 2 (two) times daily.   carvedilol (COREG) 3.125 MG tablet Take 1 tablet (3.125 mg total) by mouth 2 (two) times daily with a meal.   famciclovir (FAMVIR) 500 MG tablet TAKE 1 TABLET BY MOUTH EVERY DAY   fluticasone furoate-vilanterol (BREO ELLIPTA) 100-25 MCG/INH AEPB Inhale 1 puff into the lungs daily.   ipratropium-albuterol (DUONEB) 0.5-2.5 (3) MG/3ML SOLN Take 3 mLs by nebulization every 6 (six) hours as needed.   lidocaine (LIDODERM) 5 % Place 1 patch onto the skin daily. Remove & Discard patch within 12 hours or as directed by MD    Probiotic Product (PRO-BIOTIC BLEND PO) Take by mouth every evening.   umeclidinium bromide (INCRUSE ELLIPTA) 62.5 MCG/INH AEPB Inhale 1 puff into the lungs daily.   vitamin B-12 (CYANOCOBALAMIN) 1000 MCG tablet Take 1,000 mcg by mouth daily.   No current facility-administered medications for this visit. (Other)      REVIEW OF SYSTEMS: ROS   Negative for: Constitutional, Gastrointestinal, Neurological, Skin, Genitourinary, Musculoskeletal, HENT, Endocrine, Cardiovascular, Eyes, Respiratory, Psychiatric, Allergic/Imm, Heme/Lymph Last edited by Angeline Slim on 07/27/2021 12:58 PM.       ALLERGIES No Known Allergies  PAST MEDICAL HISTORY Past Medical History:  Diagnosis Date   Asthma    as child   GERD (gastroesophageal reflux disease)    High blood pressure    Iritis    Reflux    Past Surgical History:  Procedure Laterality Date   LYMPH NODE BIOPSY Right 07/29/2014   Procedure: RIGHT INGUINAL LYMPH NODE BIOPSY;  Surgeon: [name], MD;  Location: [facility];  Service: General;  Laterality: Right;   NO PAST SURGERIES     SKIN BIOPSY Right 07/29/2014   Procedure: SKIN PUNCH BIOPSY;  Surgeon: [name], MD;  Location: [facility];  Service: General;  Laterality: Right;    FAMILY HISTORY Family  History  Problem Relation Age of Onset   Congestive Heart Failure Father    Congestive Heart Failure Brother    Congestive Heart Failure Paternal Grandmother     SOCIAL HISTORY Social History   Tobacco Use   Smoking status: Never   Smokeless tobacco: Never  Vaping Use   Vaping Use: Never used  Substance Use Topics   Alcohol use: No    Alcohol/week: 0.0 standard drinks   Drug use: No         OPHTHALMIC EXAM:  Base Eye Exam     Visual Acuity (ETDRS)       Right Left   Dist cc 20/30 -2 20/30 -1   Dist ph cc NI NI    Correction: Glasses         Tonometry (Tonopen, 1:04 PM)       Right Left   Pressure 12 12         Pupils        Pupils APD   Right PERRL None   Left PERRL None         Visual Fields       Left Right    Full Full         Extraocular Movement       Right Left    Full Full         Neuro/Psych     Oriented x3: Yes   Mood/Affect: Normal         Dilation     Both eyes: 1.0% Mydriacyl, 2.5% Phenylephrine @ 1:04 PM           Slit Lamp and Fundus Exam     External Exam       Right Left   External Normal Normal         Slit Lamp Exam       Right Left   Lids/Lashes Normal Normal   Conjunctiva/Sclera White and quiet White and quiet   Cornea Clear Clear   Anterior Chamber Deep and quiet Deep and quiet   Iris Round and reactive Round and reactive   Lens Posterior chamber intraocular lens Posterior chamber intraocular lens   Anterior Vitreous Normal, clear no cells Normal clear no cells         Fundus Exam       Right Left   Posterior Vitreous Posterior vitreous detachment Posterior vitreous detachment no cellular debris   Disc Normal Normal   C/D Ratio 0.25 0.25   Macula Normal Normal   Vessels No active vasculitis No active vasculitis, area of perivascular chorioretinal scarring 2:00 peripherally and anteriorly as well as whitening of retinal vessels centered at 8:00 anteriorly no active inflammation   Periphery Normal, no retinal breaks Normal, no retinal breaks            IMAGING AND PROCEDURES  Imaging and Procedures for 07/27/21  OCT, Retina - OU - Both Eyes       Right Eye Quality was good. Scan locations included subfoveal. Central Foveal Thickness: 263. Progression has been stable. Findings include normal observations.   Left Eye Quality was good. Scan locations included subfoveal. Central Foveal Thickness: 249. Progression has been stable. Findings include abnormal foveal contour, outer retinal atrophy.   Notes No active maculopathy     Color Fundus Photography Optos - OU - Both Eyes       Right Eye Progression has been stable.  Disc findings include normal observations. Macula : normal observations. Vessels : attenuated.  Periphery : normal observations.   Left Eye Progression has been stable. Disc findings include normal observations. Macula : normal observations. Vessels : attenuated. Periphery : normal observations.   Notes No active retinal vasculitis nor chorioretinitis nor retinitis  OS with peripheral whitening of retinal vessels at the 8 o'clock position anteriorly.  No change to this area.  This is an area of previous apparent vasculitis As well as superotemporal at the 2 o'clock position.  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 Retinal vasculitis of both eyes  H35.063 OCT, Retina - OU - Both Eyes    Color Fundus Photography Optos - OU - Both Eyes    2. 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 Left epiretinal membrane  H35.372       1.  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  2.  Low-grade iridocyclitis OU also controlled for years now once every other day drop prednisolone acetate  3.  Ophthalmic Meds Ordered this visit:  Meds ordered this encounter  Medications   prednisoLONE acetate (PRED FORTE) 1 % ophthalmic suspension    Sig: 1 DROP IN BOTH EYES EVERY OTHER DAY    Dispense:  5 mL    Refill:  6       Return in about 9 months (around 04/27/2022) for DILATE OU, COLOR FP, OCT.  Patient Instructions  Patient to continue prednisolone acetate 1 drop every other day to each eye   Famvir 500 mg 1 pill daily for life   Explained the diagnoses, plan, and follow up with the patient and they expressed  understanding.  Patient expressed understanding of the importance of proper follow up care.   Alford Highland Jerryl Holzhauer M.D. Diseases & Surgery of the Retina and Vitreous [facility] 07/27/21     Abbreviations: M myopia (nearsighted); A astigmatism; H hyperopia (farsighted); P presbyopia; Mrx spectacle prescription;  CTL contact lenses; OD right eye; OS left eye; OU both eyes  XT exotropia; ET esotropia; PEK punctate epithelial keratitis; PEE punctate epithelial erosions; DES dry eye syndrome; MGD meibomian gland dysfunction; ATs artificial tears; PFAT's preservative free artificial tears; NSC nuclear sclerotic cataract; PSC posterior subcapsular cataract; ERM epi-retinal membrane; PVD posterior vitreous detachment; RD retinal detachment; DM diabetes mellitus; DR diabetic retinopathy; NPDR non-proliferative diabetic retinopathy; PDR proliferative diabetic retinopathy; CSME clinically significant macular edema; DME diabetic macular edema; dbh dot blot hemorrhages; CWS cotton wool spot; POAG primary open angle glaucoma; C/D cup-to-disc ratio; HVF humphrey visual field; GVF goldmann visual field; OCT optical coherence tomography; IOP intraocular pressure; BRVO Branch retinal vein occlusion; CRVO central retinal vein occlusion; CRAO central retinal artery occlusion; BRAO branch retinal artery occlusion; RT retinal tear; SB scleral buckle; PPV pars plana vitrectomy; VH Vitreous hemorrhage; PRP panretinal laser photocoagulation; IVK intravitreal kenalog; VMT vitreomacular traction; MH Macular hole;  NVD neovascularization of the disc; NVE neovascularization elsewhere; AREDS age related eye disease study; ARMD age related macular degeneration; POAG primary open angle glaucoma; EBMD epithelial/anterior basement membrane dystrophy; ACIOL anterior chamber intraocular lens; IOL intraocular lens; PCIOL posterior chamber intraocular lens; Phaco/IOL phacoemulsification with intraocular  lens placement; PRK  photorefractive keratectomy; LASIK laser assisted in situ keratomileusis; HTN hypertension; DM diabetes mellitus; COPD chronic obstructive pulmonary disease

## 2021-07-27 NOTE — Patient Instructions (Signed)
Patient to continue prednisolone acetate 1 drop every other day to each eye   Famvir 500 mg 1 pill daily for life

## 2021-07-27 NOTE — Assessment & Plan Note (Signed)
Also controlled and no recurrences on chronic suppressive Famvir

## 2021-08-25 ENCOUNTER — Other Ambulatory Visit: Payer: Self-pay

## 2021-08-25 ENCOUNTER — Emergency Department (HOSPITAL_COMMUNITY)
Admission: EM | Admit: 2021-08-25 | Discharge: 2021-08-25 | Disposition: A | Discharge status: Home / Self Care | Payer: Medicare Other | Attending: Emergency Medicine | Admitting: Emergency Medicine

## 2021-08-25 ENCOUNTER — Emergency Department (HOSPITAL_COMMUNITY): Payer: Medicare Other

## 2021-08-25 ENCOUNTER — Encounter (HOSPITAL_COMMUNITY): Payer: Self-pay

## 2021-08-25 DIAGNOSIS — Z7951 Long term (current) use of inhaled steroids: Secondary | ICD-10-CM | POA: Insufficient documentation

## 2021-08-25 DIAGNOSIS — I208 Other forms of angina pectoris: Secondary | ICD-10-CM

## 2021-08-25 DIAGNOSIS — Z79899 Other long term (current) drug therapy: Secondary | ICD-10-CM | POA: Diagnosis not present

## 2021-08-25 DIAGNOSIS — J45909 Unspecified asthma, uncomplicated: Secondary | ICD-10-CM | POA: Diagnosis not present

## 2021-08-25 DIAGNOSIS — I200 Unstable angina: Secondary | ICD-10-CM | POA: Diagnosis not present

## 2021-08-25 DIAGNOSIS — I10 Essential (primary) hypertension: Secondary | ICD-10-CM | POA: Diagnosis not present

## 2021-08-25 DIAGNOSIS — R0602 Shortness of breath: Secondary | ICD-10-CM | POA: Diagnosis present

## 2021-08-25 LAB — URINALYSIS, ROUTINE W REFLEX MICROSCOPIC
Bilirubin Urine: NEGATIVE
Glucose, UA: NEGATIVE mg/dL
Hgb urine dipstick: NEGATIVE
Ketones, ur: NEGATIVE mg/dL
Leukocytes,Ua: NEGATIVE
Nitrite: NEGATIVE
Protein, ur: NEGATIVE mg/dL
Specific Gravity, Urine: 1.013 (ref 1.005–1.030)
pH: 7 (ref 5.0–8.0)

## 2021-08-25 LAB — COMPREHENSIVE METABOLIC PANEL
ALT: 19 U/L (ref 0–44)
AST: 27 U/L (ref 15–41)
Albumin: 3.5 g/dL (ref 3.5–5.0)
Alkaline Phosphatase: 62 U/L (ref 38–126)
Anion gap: 7 (ref 5–15)
BUN: 17 mg/dL (ref 8–23)
CO2: 22 mmol/L (ref 22–32)
Calcium: 9.1 mg/dL (ref 8.9–10.3)
Chloride: 108 mmol/L (ref 98–111)
Creatinine, Ser: 0.91 mg/dL (ref 0.44–1.00)
GFR, Estimated: 60 mL/min — ABNORMAL LOW (ref >60)
Glucose, Bld: 110 mg/dL — ABNORMAL HIGH (ref 70–99)
Potassium: 4.4 mmol/L (ref 3.5–5.1)
Sodium: 137 mmol/L (ref 135–145)
Total Bilirubin: 0.7 mg/dL (ref 0.3–1.2)
Total Protein: 6.5 g/dL (ref 6.5–8.1)

## 2021-08-25 LAB — CBC WITH DIFFERENTIAL/PLATELET
Abs Immature Granulocytes: 0.02 10*3/uL (ref 0.00–0.07)
Basophils Absolute: 0.1 10*3/uL (ref 0.0–0.1)
Basophils Relative: 1 %
Eosinophils Absolute: 0.1 10*3/uL (ref 0.0–0.5)
Eosinophils Relative: 1 %
HCT: 38.6 % (ref 36.0–46.0)
Hemoglobin: 12.7 g/dL (ref 12.0–15.0)
Immature Granulocytes: 0 %
Lymphocytes Relative: 9 %
Lymphs Abs: 0.9 10*3/uL (ref 0.7–4.0)
MCH: 33.1 pg (ref 26.0–34.0)
MCHC: 32.9 g/dL (ref 30.0–36.0)
MCV: 100.5 fL — ABNORMAL HIGH (ref 80.0–100.0)
Monocytes Absolute: 0.7 10*3/uL (ref 0.1–1.0)
Monocytes Relative: 7 %
Neutro Abs: 8.3 10*3/uL — ABNORMAL HIGH (ref 1.7–7.7)
Neutrophils Relative %: 82 %
Platelets: 180 10*3/uL (ref 150–400)
RBC: 3.84 MIL/uL — ABNORMAL LOW (ref 3.87–5.11)
RDW: 13.5 % (ref 11.5–15.5)
WBC: 10.2 10*3/uL (ref 4.0–10.5)
nRBC: 0 % (ref 0.0–0.2)

## 2021-08-25 LAB — TROPONIN I (HIGH SENSITIVITY)
Troponin I (High Sensitivity): 5 ng/L (ref <18)
Troponin I (High Sensitivity): 3 ng/L (ref <18)

## 2021-08-25 LAB — LIPASE, BLOOD: Lipase: 38 U/L (ref 11–51)

## 2021-08-25 MED ORDER — ASPIRIN 81 MG PO TBEC: 81.0000 mg | DELAYED_RELEASE_TABLET | Freq: Every day | ORAL | 1 refills | Status: DC

## 2021-08-25 MED ORDER — NITROGLYCERIN 0.4 MG SL SUBL
SUBLINGUAL_TABLET | SUBLINGUAL | 0 refills | Status: AC
Start: 2021-08-25 — End: ?

## 2021-08-25 MED ORDER — ASPIRIN 325 MG PO TABS
325.0000 mg | ORAL_TABLET | Freq: Every day | ORAL | Status: DC
  Administered 2021-08-25: 325 mg via ORAL
  Filled 2021-08-25: qty 1

## 2021-08-25 NOTE — ED Triage Notes (Addendum)
Pt BIB [facility] from home d/t nausea, no vomiting. Also c/o SOB, but sat at 99% on room air. Denies chest pain. Per EMS, pt was out running errands this morning, then came back to the house started those symptoms. A&O X4. VSS. 4mg  zofran given by EMS.

## 2021-08-25 NOTE — ED Provider Triage Note (Signed)
Emergency Medicine Provider Triage Evaluation Note  Anne Bradshaw , a 88 y.o. female  was evaluated in triage.  Pt complains of shortness of breath and nausea.  Today around noon patient returned from shopping at the supermarket when she had a onset of shortness of breath.  Patient reports that she fell like she could not catch her breath.  After breathing heavy for a few moments she began to feel nauseous.  Denies any known sick contacts  Review of Systems  Positive: Shortness of breath, nausea Negative: Fever, chills, chest pain, palpitations, lightheadedness, dizziness, syncope, leg swelling or tenderness, abdominal pain, vomiting, constipation, diarrhea, blood in stool, melena, dysuria, hematuria, urinary urgency  Physical Exam  BP (!) 190/57   Pulse 61   Temp 97.6 F (36.4 C) (Oral)   Resp 13   SpO2 98%  Gen:   Awake, no distress   Resp:  Normal effort, clear to auscultation bilaterally.  Speaks in focally senses without difficulty MSK:   Moves extremities without difficulty; no edema to bilateral lower extremities Other:  Abdomen soft, nondistended, nontender with no guarding or rebound tenderness  Medical Decision Making  Medically screening exam initiated at 2:24 PM.  Appropriate orders placed.  Anne Bradshaw was informed that the remainder of the evaluation will be completed by another provider, this initial triage assessment does not replace that evaluation, and the importance of remaining in the ED until their evaluation is complete.     Anne Bradshaw, [place] 08/25/21 1425

## 2021-08-25 NOTE — Discharge Instructions (Signed)
I am concerned that your episode today was due to something called unstable angina.  If you have similar symptoms of sudden onset of shortness of breath that is not getting better after 10 to 15 minutes at rest you should take sublingual nitro Eliquis glycerin as directed and call 911.  You will need to be reevaluated in the hospital.  I have given you a referral to cardiology.  They should be contacting you shortly to get you in for an urgent follow-up in the clinic to further evaluate the symptoms you had today  Please begin taking a single dose of baby aspirin daily.  I have written a prescription for this.  Your caregiver has diagnosed you as having chest pain that is not specific for one problem, but does not require admission.  You are at low risk for an acute heart condition or other serious illness. Chest pain comes from many different causes.  SEEK IMMEDIATE MEDICAL ATTENTION IF: You have severe chest pain, especially if the pain is crushing or pressure-like and spreads to the arms, back, neck, or jaw, or if you have sweating, nausea (feeling sick to your stomach), or shortness of breath. THIS IS AN EMERGENCY. Don't wait to see if the pain will go away. Get medical help at once. Call 911 or 0 (operator). DO NOT drive yourself to the hospital.  Your chest pain gets worse and does not go away with rest.  You have an attack of chest pain lasting longer than usual, despite rest and treatment with the medications your caregiver has prescribed.  You wake from sleep with chest pain or shortness of breath.  You feel dizzy or faint.  You have chest pain not typical of your usual pain for which you originally saw your caregiver.

## 2021-08-25 NOTE — ED Provider Notes (Signed)
[facility] EMERGENCY DEPARTMENT Provider Note   CSN: [id number] Arrival date & time: 08/25/21  1405     History  Chief Complaint  Patient presents with   Nausea   Shortness of Breath    Anne Bradshaw is a 88 y.o. female who presents emergency department with a chief complaint of shortness of breath.  She has a past medical history of esophageal reflux, asthma, and hypertension.  Patient presents today with chief complaint of shortness of breath.  History is given by the patient and her husband who is at bedside.  Around 12:30 PM today they were putting groceries away.  Husband states that his wife began complaining of shortness of breath.  He asked her to sit down.  During that time she appeared to have labored breathing.  He states that as she was resting it seemed to get worse.  She then complained that she was feeling nauseous.  She did not vomit.  He proceeded to call an ambulance.  Patient received Zofran prior to arrival and her nausea resolved however she states it took about an hour for that sensation of shortness of breath to fully resolved.  She reports that she has had intermittent episodes of this for the past several months but it has never lasted longer than 10 or 15 minutes.  She is unsure if it is associated with exertion however does seem to get better if she sits and rests.  Has no cardiac history.  She has no history of smoking, diabetes, hyperlipidemia.   Shortness of Breath      Home Medications Prior to Admission medications   Medication Sig Start Date End Date Taking? Authorizing Provider  acetaminophen (TYLENOL) 325 MG tablet Take 650 mg by mouth daily as needed for mild pain.    [provider]  amLODipine (NORVASC) 5 MG tablet Take 1 tablet (5 mg total) by mouth 2 (two) times daily. 03/16/18   Barnetta Chapel, MD  carvedilol (COREG) 3.125 MG tablet Take 1 tablet (3.125 mg total) by mouth 2 (two) times daily with a meal. 03/16/18    Barnetta Chapel, MD  famciclovir (FAMVIR) 500 MG tablet TAKE 1 TABLET BY MOUTH EVERY DAY 03/29/21   Rankin, Alford Highland, MD  fluticasone furoate-vilanterol (BREO ELLIPTA) 100-25 MCG/INH AEPB Inhale 1 puff into the lungs daily. 03/17/18   Berton Mount I, MD  ipratropium-albuterol (DUONEB) 0.5-2.5 (3) MG/3ML SOLN Take 3 mLs by nebulization every 6 (six) hours as needed. 03/16/18   Berton Mount I, MD  lidocaine (LIDODERM) 5 % Place 1 patch onto the skin daily. Remove & Discard patch within 12 hours or as directed by MD 03/16/18   Barnetta Chapel, MD  Polyethyl Glycol-Propyl Glycol 0.4-0.3 % SOLN Apply to eye 4 (four) times daily as needed.    [provider]  prednisoLONE acetate (PRED FORTE) 1 % ophthalmic suspension 1 DROP IN BOTH EYES EVERY OTHER DAY 07/27/21   Rankin, Alford Highland, MD  Probiotic Product (PRO-BIOTIC BLEND PO) Take by mouth every evening.    [provider]  umeclidinium bromide (INCRUSE ELLIPTA) 62.5 MCG/INH AEPB Inhale 1 puff into the lungs daily. 03/17/18   Barnetta Chapel, MD  vitamin B-12 (CYANOCOBALAMIN) 1000 MCG tablet Take 1,000 mcg by mouth daily.    [provider]      Allergies    Patient has no known allergies.    Review of Systems   Review of Systems  Respiratory:  Positive for  shortness of breath.     Physical Exam Updated Vital Signs BP (!) 158/82   Pulse 68   Temp 97.6 F (36.4 C) (Oral)   Resp 19   SpO2 97%  Physical Exam Vitals and nursing note reviewed.  Constitutional:      General: She is not in acute distress.    Appearance: She is well-developed. She is not diaphoretic.  HENT:     Head: Normocephalic and atraumatic.     Right Ear: External ear normal.     Left Ear: External ear normal.     Nose: Nose normal.     Mouth/Throat:     Mouth: Mucous membranes are moist.  Eyes:     General: No scleral icterus.    Conjunctiva/sclera: Conjunctivae normal.  Cardiovascular:     Rate and Rhythm: Normal rate and  regular rhythm.     Heart sounds: Normal heart sounds. No murmur heard.    No friction rub. No gallop.  Pulmonary:     Effort: Pulmonary effort is normal. No respiratory distress.     Breath sounds: Normal breath sounds.  Abdominal:     General: Bowel sounds are normal. There is no distension.     Palpations: Abdomen is soft. There is no mass.     Tenderness: There is no abdominal tenderness. There is no guarding.  Musculoskeletal:     Cervical back: Normal range of motion.     Right lower leg: No edema.     Left lower leg: No edema.  Skin:    General: Skin is warm and dry.  Neurological:     Mental Status: She is alert and oriented to person, place, and time.  Psychiatric:        Behavior: Behavior normal.     ED Results / Procedures / Treatments   Labs (all labs ordered are listed, but only abnormal results are displayed) Labs Reviewed  COMPREHENSIVE METABOLIC PANEL - Abnormal; Notable for the following components:      Result Value   Glucose, Bld 110 (*)    GFR, Estimated 60 (*)    All other components within normal limits  CBC WITH DIFFERENTIAL/PLATELET - Abnormal; Notable for the following components:   RBC 3.84 (*)    MCV 100.5 (*)    Neutro Abs 8.3 (*)    All other components within normal limits  LIPASE, BLOOD  URINALYSIS, ROUTINE W REFLEX MICROSCOPIC  TROPONIN I (HIGH SENSITIVITY)  TROPONIN I (HIGH SENSITIVITY)    EKG EKG Interpretation  Date/Time:  Wednesday August 25 2021 16:02:17 EDT Ventricular Rate:  67 PR Interval:  147 QRS Duration: 96 QT Interval:  412 QTC Calculation: 435 R Axis:   -35 Text Interpretation: Sinus rhythm Atrial premature complex Left axis deviation Abnormal R-wave progression, early transition No significant change since Confirmed by Jacalyn Lefevre [id number]) on 08/25/2021 4:15:48 PM  Radiology DG CHEST PORT 1 VIEW  Result Date: 08/25/2021 CLINICAL DATA:  Shortness of breath and nausea EXAM: PORTABLE CHEST 1 VIEW COMPARISON:  Chest  x-ray dated November 26, 2019 FINDINGS: The heart size and mediastinal contours are within normal limits. Biapical pleural-parenchymal scarring. Both lungs are clear. The visualized skeletal structures are unremarkable. IMPRESSION: No active disease. Electronically Signed   By: Allegra Lai M.D.   On: 08/25/2021 14:56    Procedures Procedures    Medications Ordered in ED Medications - No data to display  ED Course/ Medical Decision Making/ A&P Clinical Course as of 08/25/21 1849  Wed  Aug 25, 2021  1750 Case discussed with PA Vin Bhagat. Currently no objective evidence of acute ischemia however I do have concern for unstable angina.  He does not feel that this would need admission if she does not have elevation in her second troponin however she does need close follow-up and he recommends doing her started on enteric-coated baby aspirin and outpatient nitroglycerin. [AH]  1755 Comprehensive metabolic panel(!) [AH]  [id number] CBC with Differential(!) [AH]  1755 Troponin I (High Sensitivity) [AH]  1755 Lipase, blood [AH]    Clinical Course User Index [AH] Margarita Mail, PA-C                           Medical Decision Making This patient presents to the ED for concern of sob, this involves an extensive number of treatment options, and is a complaint that carries with it a high risk of complications and morbidity.  The differential diagnosis includes ;The emergent differential diagnosis for shortness of breath includes, but is not limited to, Pulmonary edema, bronchoconstriction, Pneumonia, Pulmonary embolism, Pneumotherax/ Hemothorax, Dysrythmia, ACS.    After review of all data points patient's findings are most concerning for unstable angina/anginal equivalent. I have discussed the case with cardiology who feel that due to no objective findings of ischemia currently she can be seen in very close outpatient follow-up with plan to discharge on aspirin and nitroglycerin.  I discussed closely  with the patient how to utilize nitroglycerin.  Patient prefers to be discharged.  Co morbidities that complicate the patient evaluation       age, htn   Additional history obtained:  Additional history obtained from patient's husband    Lab Tests:  I Ordered, and personally interpreted labs.  The pertinent results include:  No acute findings    Imaging Studies ordered:  I ordered imaging studies including cxr I independently visualized and interpreted imaging which showed no acute findings I agree with the radiologist interpretation   Cardiac Monitoring:       The patient was maintained on a cardiac monitor.  I personally viewed and interpreted the cardiac monitored which showed an underlying rhythm of: NSR   Medicines ordered and prescription drug management:  I ordered medication including asa  for suspected angina  I have reviewed the patients home medicines and have made adjustments as needed   Test Considered:       ct angio= don't feel necessary at this time given her resolution of sxs   Critical Interventions:          Consultations Obtained:  I requested consultation with the cardiology (Vin Bhagat),  and discussed lab and imaging findings as well as pertinent plan - they recommend: close op f/u with NTG at home   Problem List / ED Course:       SOB- angina   Reevaluation:  After the interventions noted above, I reevaluated the patient and found that they have :improved   Social Determinants of Health:       very close follow up    Dispostion:  After consideration of the diagnostic results and the patients response to treatment, I feel that the patent would benefit from discharge- OP work up..    Amount and/or Complexity of Data Reviewed Labs:  Decision-making details documented in ED Course.  Risk OTC drugs. Prescription drug management. Risk Details: HEART score 4           Final Clinical Impression(s) / ED  Diagnoses Final diagnoses:  None    Rx / DC Orders ED Discharge Orders     None         Margarita Mail, PA-C 08/25/21 2100    Isla Pence, MD 08/25/21 2222

## 2021-09-16 ENCOUNTER — Encounter: Payer: Self-pay | Admitting: Cardiovascular Disease

## 2021-09-16 ENCOUNTER — Ambulatory Visit (INDEPENDENT_AMBULATORY_CARE_PROVIDER_SITE_OTHER): Payer: Medicare Other | Admitting: Cardiovascular Disease

## 2021-09-16 VITALS — BP 184/60 | HR 64 | Ht 64.0 in | Wt 119.8 lb

## 2021-09-16 DIAGNOSIS — R011 Cardiac murmur, unspecified: Secondary | ICD-10-CM

## 2021-09-16 DIAGNOSIS — R0602 Shortness of breath: Secondary | ICD-10-CM | POA: Diagnosis not present

## 2021-09-16 DIAGNOSIS — I208 Other forms of angina pectoris: Secondary | ICD-10-CM

## 2021-09-16 NOTE — Progress Notes (Signed)
Chief Complaint  Patient presents with   New Patient (Initial Visit)    Dyspnea   History of Present Illness: 88 yo female with history of GERD and HTN who is here today as a new patient for the evaluation of dyspnea. No prior cardiac testing. She had normal ABI in 2020. She was seen in the ED 08/25/21 with dyspnea and nausea. No chest pain. No ischemic changes on EKG. Troponin negative. She tells me today that she has been feeling well. She denies any chest pain or dyspnea over the past three weeks. She states that she feels well. No LE edema, dizziness or near syncope.   Primary Care Physician: Clovis Riley, L.August Saucer, MD   Past Medical History:  Diagnosis Date   Asthma    as child   GERD (gastroesophageal reflux disease)    High blood pressure    Iritis    Reflux    SOB (shortness of breath)     Past Surgical History:  Procedure Laterality Date   LYMPH NODE BIOPSY Right 07/29/2014   Procedure: RIGHT INGUINAL LYMPH NODE BIOPSY;  Surgeon: [name], MD;  Location: [facility];  Service: General;  Laterality: Right;   NO PAST SURGERIES     SKIN BIOPSY Right 07/29/2014   Procedure: SKIN PUNCH BIOPSY;  Surgeon: [name], MD;  Location: [facility];  Service: General;  Laterality: Right;    Current Outpatient Medications  Medication Sig Dispense Refill   acetaminophen (TYLENOL) 325 MG tablet Take 650 mg by mouth daily as needed for mild pain.     aspirin EC 81 MG tablet Take 1 tablet (81 mg total) by mouth daily. Swallow whole. 30 tablet 1   carvedilol (COREG) 3.125 MG tablet Take 1 tablet (3.125 mg total) by mouth 2 (two) times daily with a meal. 60 tablet 0   famciclovir (FAMVIR) 500 MG tablet TAKE 1 TABLET BY MOUTH EVERY DAY 90 tablet 4   ipratropium-albuterol (DUONEB) 0.5-2.5 (3) MG/3ML SOLN Take 3 mLs by nebulization every 6 (six) hours as needed. 360 mL 0   nitroGLYCERIN (NITROSTAT) 0.4 MG SL tablet Take one every five minutes under your tongue for up  to 3 doses if you have onset of  shortness of breath. 30 tablet 0   Polyethyl Glycol-Propyl Glycol 0.4-0.3 % SOLN Apply to eye 4 (four) times daily as needed.     prednisoLONE acetate (PRED FORTE) 1 % ophthalmic suspension 1 DROP IN BOTH EYES EVERY OTHER DAY 5 mL 6   vitamin B-12 (CYANOCOBALAMIN) 1000 MCG tablet Take 1,000 mcg by mouth daily.     gabapentin (NEURONTIN) 300 MG capsule Take 300 mg by mouth 2 (two) times daily.     No current facility-administered medications for this visit.    Allergies  Allergen Reactions   Sulfa Antibiotics     Social History   Socioeconomic History   Marital status: Married    Spouse name: Not on file   Number of children: 2   Years of education: Not on file   Highest education level: Not on file  Occupational History   Occupation: Retired  Tobacco Use   Smoking status: Never   Smokeless tobacco: Never  Vaping Use   Vaping Use: Never used  Substance and Sexual Activity   Alcohol use: No    Alcohol/week: 0.0 standard drinks of alcohol   Drug use: No   Sexual activity: Not on file  Other Topics Concern   Not on file  Social History Narrative   Not on file   Social Determinants of Health   Financial Resource Strain: Not on file  Food Insecurity: Not on file  Transportation Needs: Not on file  Physical Activity: Not on file  Stress: Not on file  Social Connections: Not on file  Intimate Partner Violence: Not on file    Family History  Problem Relation Age of Onset   Congestive Heart Failure Father    Congestive Heart Failure Brother    Congestive Heart Failure Paternal Grandmother     Review of Systems:  As stated in the HPI and otherwise negative.   BP (!) 184/60   Pulse 64   Ht 5\' 4"  (1.626 m)   Wt 119 lb 12.8 oz (54.3 kg)   SpO2 95%   BMI 20.56 kg/m   Physical Examination: General: Well developed, well nourished, NAD  HEENT: OP clear, mucus membranes moist  SKIN: warm, dry. No rashes. Neuro: No focal deficits   Musculoskeletal: Muscle strength 5/5 all ext  Psychiatric: Mood and affect normal  Neck: No JVD, no carotid bruits, no thyromegaly, no lymphadenopathy.  Lungs:Clear bilaterally, no wheezes, rhonci, crackles Cardiovascular: Regular rate and rhythm. Soft systolic murmur.  Abdomen:Soft. Bowel sounds present. Non-tender.  Extremities: No lower extremity edema. Pulses are 2 + in the bilateral DP/PT.  EKG:  EKG is not ordered today. The ekg ordered today demonstrates  The EKG from 08/25/21 is reviewed today and shows sinus, PACs  Recent Labs: 08/25/2021: ALT 19; BUN 17; Creatinine, Ser 0.91; Hemoglobin 12.7; Platelets 180; Potassium 4.4; Sodium 137   Lipid Panel No results found for: "CHOL", "TRIG", "HDL", "CHOLHDL", "VLDL", "LDLCALC", "LDLDIRECT"   Wt Readings from Last 3 Encounters:  09/16/21 119 lb 12.8 oz (54.3 kg)  05/10/19 120 lb (54.4 kg)  03/15/18 119 lb 0.8 oz (54 kg)    Assessment and Plan:   1. Dyspnea: This has resolved. She feels great. No concerning findings on recent testing in ED. EKG normal. Chest x-ray normal. Troponin negative. Echo to assess LVEF   2. Cardiac murmur: Soft systolic murmur. Echo to assess  Labs/ tests ordered today include:   Orders Placed This Encounter  Procedures   ECHOCARDIOGRAM COMPLETE     Disposition:   F/U with me as needed.    Signed, 03/17/18, MD 09/16/2021 2:28 PM    [facility] [address] [place], [place], [place]  [place] Phone: [phone]; Fax: [phone]

## 2021-09-16 NOTE — Patient Instructions (Signed)
Medication Instructions:  Your physician recommends that you continue on your current medications as directed. Please refer to the Current Medication list given to you today.  *If you need a refill on your cardiac medications before your next appointment, please call your pharmacy*   Lab Work: None ordered   If you have labs (blood work) drawn today and your tests are completely normal, you will receive your results only by: MyChart Message (if you have MyChart) OR A paper copy in the mail If you have any lab test that is abnormal or we need to change your treatment, we will call you to review the results.   Testing/Procedures: Your physician has requested that you have an echocardiogram. Echocardiography is a painless test that uses sound waves to create images of your heart. It provides your doctor with information about the size and shape of your heart and how well your heart's chambers and valves are working. This procedure takes approximately one hour. There are no restrictions for this procedure.    Follow-Up: Follow up as needed     Other Instructions None

## 2021-10-01 ENCOUNTER — Ambulatory Visit (HOSPITAL_COMMUNITY): Payer: Medicare Other | Attending: Cardiovascular Disease

## 2021-10-01 DIAGNOSIS — R011 Cardiac murmur, unspecified: Secondary | ICD-10-CM | POA: Insufficient documentation

## 2021-10-01 DIAGNOSIS — R0602 Shortness of breath: Secondary | ICD-10-CM | POA: Diagnosis present

## 2021-10-01 LAB — ECHOCARDIOGRAM COMPLETE
Area-P 1/2: 2.73 cm2
S' Lateral: 2.2 cm

## 2022-01-25 IMAGING — CT CT HEAD W/O CM
1 series · 15 of 30 positions shown, 19 images · non-contrast
Comparison: CT head 03/11/2018

CLINICAL DATA: Headache, left temporal pain after walking, feeling
faint and dizzy



[Series 2: head w/(date) · axial · 0.47mm/px · z∈[-176,-36]mm · 15 of 32 slices shown, 19 images]
[im 2/32  brain]
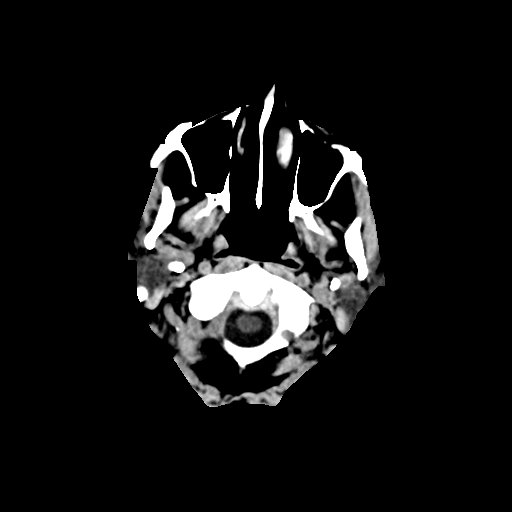
[im 2/32  bone]
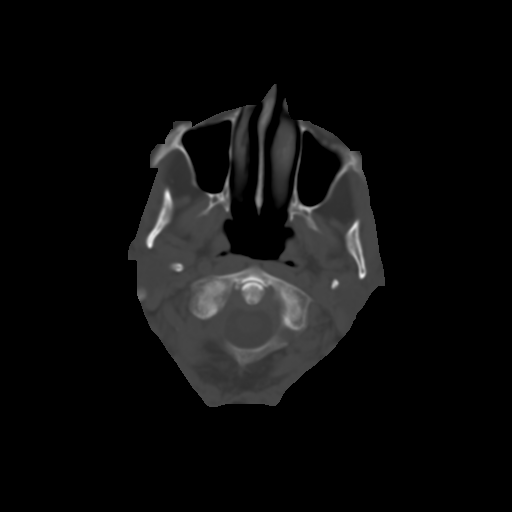
[im 4/32  brain]
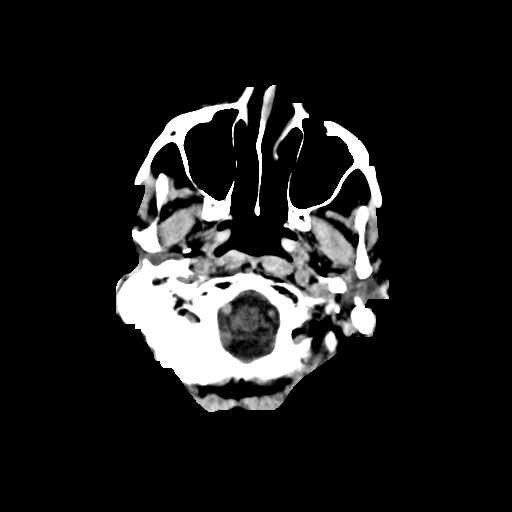
[im 6/32  brain]
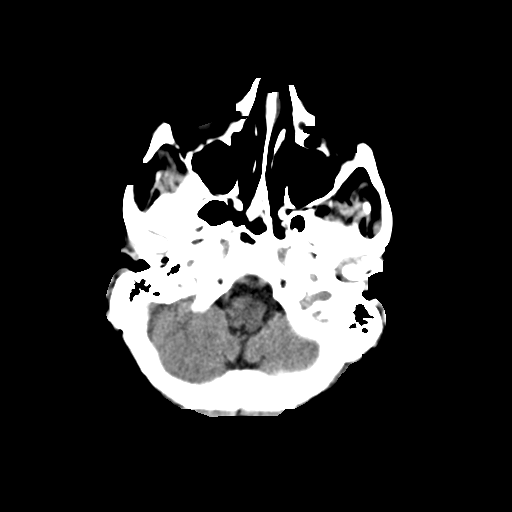
[im 8/32  brain]
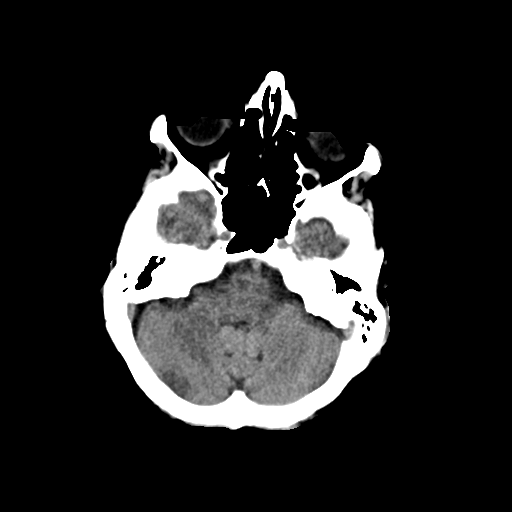
[im 10/32  brain]
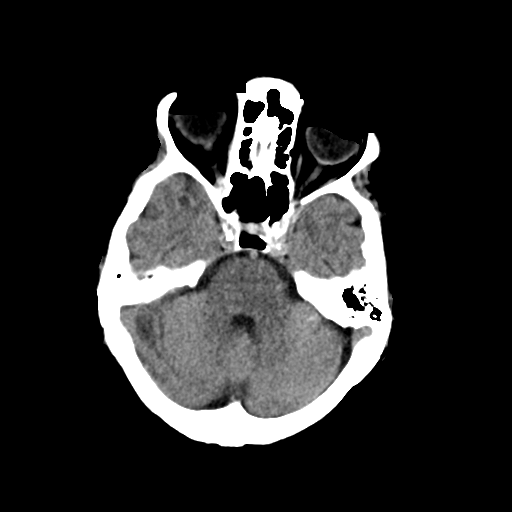
[im 10/32  bone]
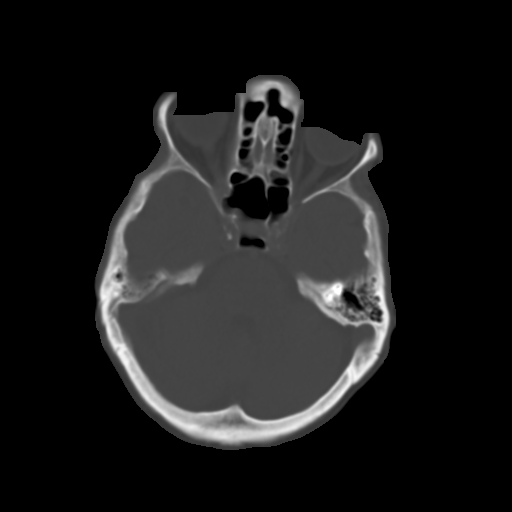
[im 12/32  brain]
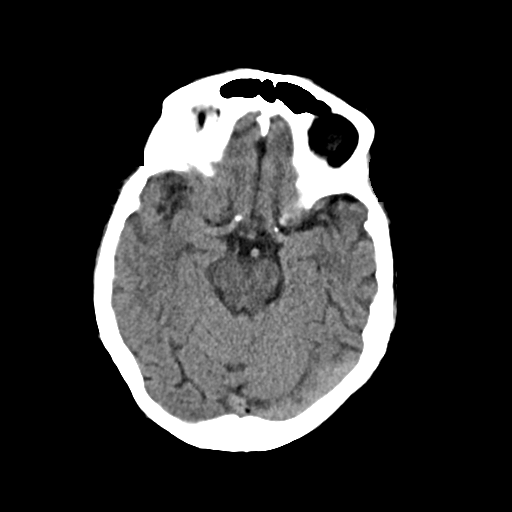
[im 14/32  brain]
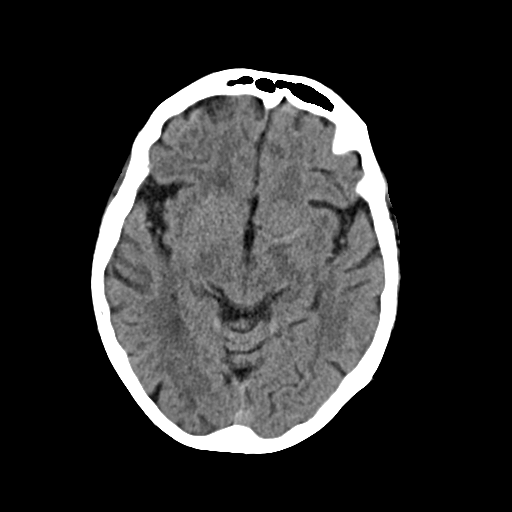
[im 17/32  brain]
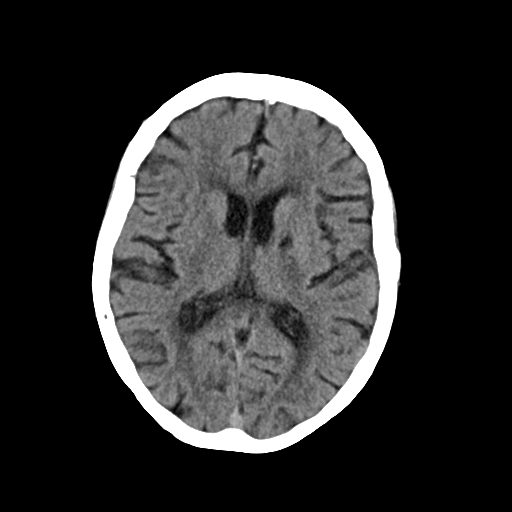
[im 18/32  brain]
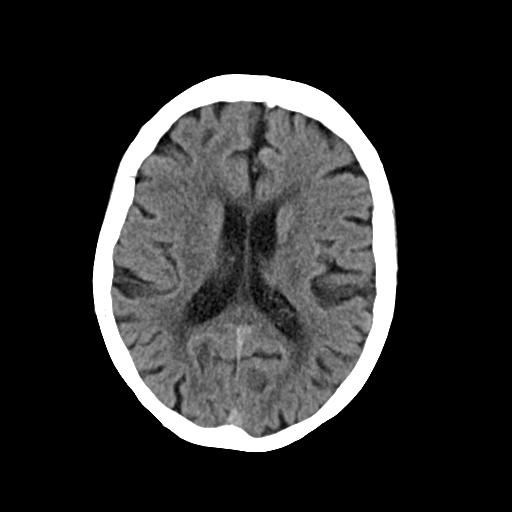
[im 18/32  bone]
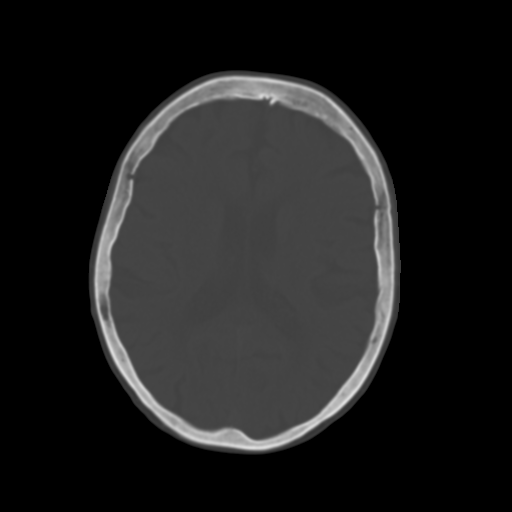
[im 20/32  brain]
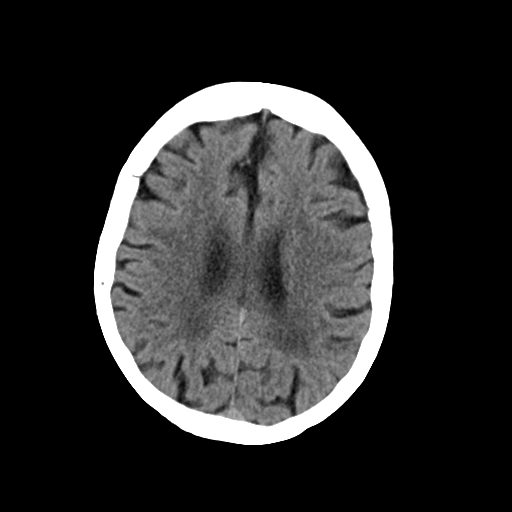
[im 22/32  brain]
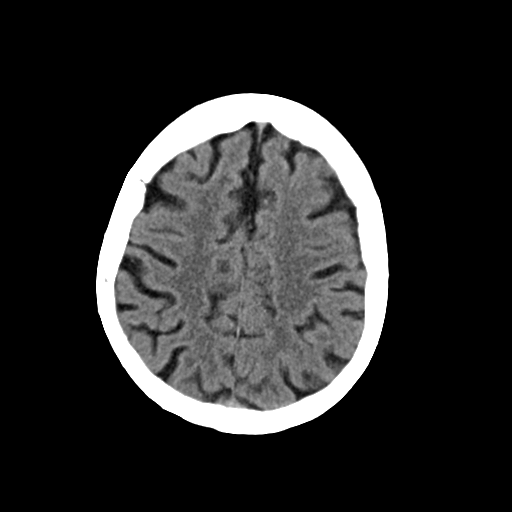
[im 24/32  brain]
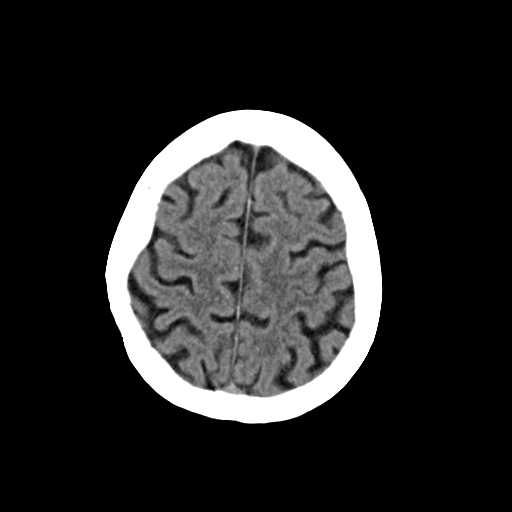
[im 26/32  brain]
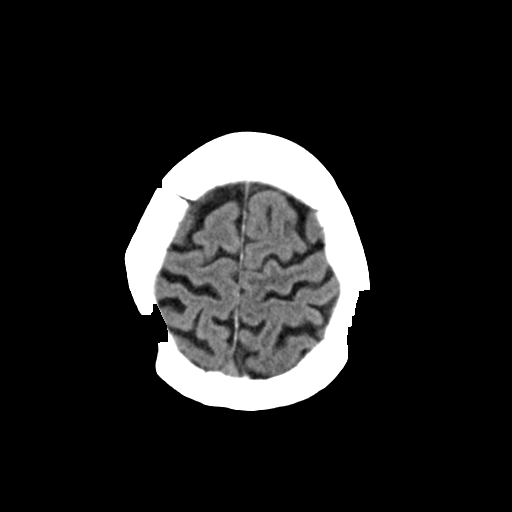
[im 26/32  bone]
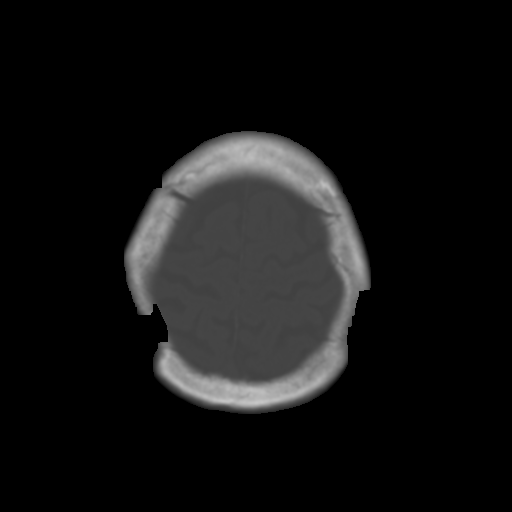
[im 28/32  brain]
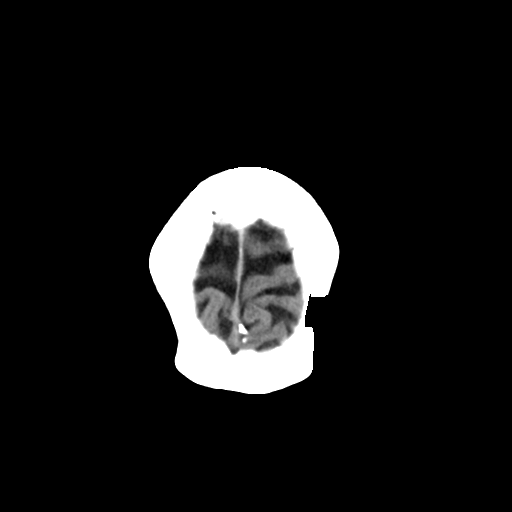
[im 30/32  brain]
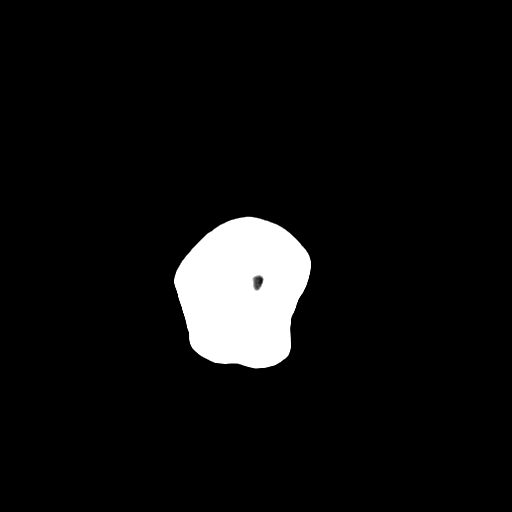

[15 of 30 positions shown; findings below may reference images not displayed]

FINDINGS: Brain: There is no evidence of acute intracranial hemorrhage,
extra-axial fluid collection, or acute infarct.

Background parenchymal volume is within normal limits. The
ventricles are stable in size. There is a lacunar infarct in the
left basal ganglia which is new since 8787 but is remote in
appearance. There is no other evidence of interval infarct. Patchy
hypodensity in the remainder of the subcortical and periventricular
white matter likely reflects sequela of mild chronic white matter
microangiopathy.

There is no mass lesion.  There is no midline shift.

Vascular: There is calcification of the bilateral cavernous ICAs.

Skull: Normal. Negative for fracture or focal lesion. Thinning of
the parietal calvarium bilaterally is unchanged.

Sinuses/Orbits: The imaged paranasal sinuses are clear. Bilateral
lens implants are in place. The globes and orbits are otherwise
unremarkable.

Other: None.
IMPRESSION: 1. No acute intracranial pathology.
2. Small lacunar infarct in the left basal ganglia is new since 8787
but is remote in appearance. No other evidence of interval infarct.
3. Mild global parenchymal volume loss and chronic white matter
microangiopathy.

## 2022-04-28 ENCOUNTER — Encounter (INDEPENDENT_AMBULATORY_CARE_PROVIDER_SITE_OTHER): Payer: Medicare Other | Admitting: Ophthalmology

## 2022-08-24 ENCOUNTER — Emergency Department (HOSPITAL_COMMUNITY): Payer: Medicare Other

## 2022-08-24 ENCOUNTER — Inpatient Hospital Stay (HOSPITAL_COMMUNITY)
Admission: EM | Admit: 2022-08-24 | Discharge: 2022-08-26 | DRG: 261 | Disposition: A | Discharge status: Home / Self Care | Payer: Medicare Other | Attending: Internal Medicine | Admitting: Internal Medicine

## 2022-08-24 ENCOUNTER — Encounter (HOSPITAL_COMMUNITY): Payer: Self-pay

## 2022-08-24 ENCOUNTER — Other Ambulatory Visit: Payer: Self-pay

## 2022-08-24 DIAGNOSIS — M25552 Pain in left hip: Secondary | ICD-10-CM | POA: Diagnosis present

## 2022-08-24 DIAGNOSIS — I4719 Other supraventricular tachycardia: Secondary | ICD-10-CM | POA: Diagnosis not present

## 2022-08-24 DIAGNOSIS — K219 Gastro-esophageal reflux disease without esophagitis: Secondary | ICD-10-CM | POA: Diagnosis present

## 2022-08-24 DIAGNOSIS — Z66 Do not resuscitate: Secondary | ICD-10-CM | POA: Diagnosis present

## 2022-08-24 DIAGNOSIS — Z681 Body mass index (BMI) 19 or less, adult: Secondary | ICD-10-CM

## 2022-08-24 DIAGNOSIS — I471 Supraventricular tachycardia, unspecified: Principal | ICD-10-CM | POA: Diagnosis present

## 2022-08-24 DIAGNOSIS — Z8249 Family history of ischemic heart disease and other diseases of the circulatory system: Secondary | ICD-10-CM

## 2022-08-24 DIAGNOSIS — Y92009 Unspecified place in unspecified non-institutional (private) residence as the place of occurrence of the external cause: Secondary | ICD-10-CM

## 2022-08-24 DIAGNOSIS — R001 Bradycardia, unspecified: Secondary | ICD-10-CM | POA: Diagnosis present

## 2022-08-24 DIAGNOSIS — R55 Syncope and collapse: Secondary | ICD-10-CM | POA: Diagnosis not present

## 2022-08-24 DIAGNOSIS — R5381 Other malaise: Secondary | ICD-10-CM | POA: Diagnosis present

## 2022-08-24 DIAGNOSIS — Z79899 Other long term (current) drug therapy: Secondary | ICD-10-CM

## 2022-08-24 DIAGNOSIS — I700 Atherosclerosis of aorta: Secondary | ICD-10-CM | POA: Diagnosis present

## 2022-08-24 DIAGNOSIS — Z9181 History of falling: Secondary | ICD-10-CM

## 2022-08-24 DIAGNOSIS — I10 Essential (primary) hypertension: Secondary | ICD-10-CM | POA: Diagnosis present

## 2022-08-24 DIAGNOSIS — R296 Repeated falls: Secondary | ICD-10-CM | POA: Diagnosis not present

## 2022-08-24 DIAGNOSIS — H35063 Retinal vasculitis, bilateral: Secondary | ICD-10-CM | POA: Diagnosis present

## 2022-08-24 DIAGNOSIS — R636 Underweight: Secondary | ICD-10-CM | POA: Diagnosis present

## 2022-08-24 DIAGNOSIS — W1830XA Fall on same level, unspecified, initial encounter: Secondary | ICD-10-CM | POA: Diagnosis present

## 2022-08-24 DIAGNOSIS — W19XXXA Unspecified fall, initial encounter: Secondary | ICD-10-CM

## 2022-08-24 DIAGNOSIS — Z882 Allergy status to sulfonamides status: Secondary | ICD-10-CM

## 2022-08-24 DIAGNOSIS — H3530 Unspecified macular degeneration: Secondary | ICD-10-CM | POA: Diagnosis present

## 2022-08-24 DIAGNOSIS — Z8619 Personal history of other infectious and parasitic diseases: Secondary | ICD-10-CM

## 2022-08-24 LAB — CBC WITH DIFFERENTIAL/PLATELET
Abs Immature Granulocytes: 0.03 10*3/uL (ref 0.00–0.07)
Basophils Absolute: 0.1 10*3/uL (ref 0.0–0.1)
Basophils Relative: 1 %
Eosinophils Absolute: 0.1 10*3/uL (ref 0.0–0.5)
Eosinophils Relative: 2 %
HCT: 39.6 % (ref 36.0–46.0)
Hemoglobin: 13 g/dL (ref 12.0–15.0)
Immature Granulocytes: 0 %
Lymphocytes Relative: 19 %
Lymphs Abs: 1.3 10*3/uL (ref 0.7–4.0)
MCH: 32.7 pg (ref 26.0–34.0)
MCHC: 32.8 g/dL (ref 30.0–36.0)
MCV: 99.7 fL (ref 80.0–100.0)
Monocytes Absolute: 0.5 10*3/uL (ref 0.1–1.0)
Monocytes Relative: 7 %
Neutro Abs: 4.8 10*3/uL (ref 1.7–7.7)
Neutrophils Relative %: 71 %
Platelets: 157 10*3/uL (ref 150–400)
RBC: 3.97 MIL/uL (ref 3.87–5.11)
RDW: 13.9 % (ref 11.5–15.5)
WBC: 6.7 10*3/uL (ref 4.0–10.5)
nRBC: 0 % (ref 0.0–0.2)

## 2022-08-24 LAB — COMPREHENSIVE METABOLIC PANEL
ALT: 20 U/L (ref 0–44)
AST: 25 U/L (ref 15–41)
Albumin: 3.1 g/dL — ABNORMAL LOW (ref 3.5–5.0)
Alkaline Phosphatase: 56 U/L (ref 38–126)
Anion gap: 6 (ref 5–15)
BUN: 15 mg/dL (ref 8–23)
CO2: 25 mmol/L (ref 22–32)
Calcium: 8.8 mg/dL — ABNORMAL LOW (ref 8.9–10.3)
Chloride: 106 mmol/L (ref 98–111)
Creatinine, Ser: 0.97 mg/dL (ref 0.44–1.00)
GFR, Estimated: 55 mL/min — ABNORMAL LOW (ref >60)
Glucose, Bld: 128 mg/dL — ABNORMAL HIGH (ref 70–99)
Potassium: 4.7 mmol/L (ref 3.5–5.1)
Sodium: 137 mmol/L (ref 135–145)
Total Bilirubin: 0.6 mg/dL (ref 0.3–1.2)
Total Protein: 6.3 g/dL — ABNORMAL LOW (ref 6.5–8.1)

## 2022-08-24 LAB — CBC
HCT: 36.4 % (ref 36.0–46.0)
Hemoglobin: 12.1 g/dL (ref 12.0–15.0)
MCH: 32.4 pg (ref 26.0–34.0)
MCHC: 33.2 g/dL (ref 30.0–36.0)
MCV: 97.6 fL (ref 80.0–100.0)
Platelets: 144 10*3/uL — ABNORMAL LOW (ref 150–400)
RBC: 3.73 MIL/uL — ABNORMAL LOW (ref 3.87–5.11)
RDW: 14 % (ref 11.5–15.5)
WBC: 6.7 10*3/uL (ref 4.0–10.5)
nRBC: 0 % (ref 0.0–0.2)

## 2022-08-24 LAB — MAGNESIUM: Magnesium: 2.1 mg/dL (ref 1.7–2.4)

## 2022-08-24 LAB — CREATININE, SERUM
Creatinine, Ser: 0.99 mg/dL (ref 0.44–1.00)
GFR, Estimated: 53 mL/min — ABNORMAL LOW (ref >60)

## 2022-08-24 LAB — TSH: TSH: 3.111 u[IU]/mL (ref 0.350–4.500)

## 2022-08-24 MED ORDER — HYDRALAZINE HCL 20 MG/ML IJ SOLN: 10.0000 mg | Freq: Four times a day (QID) | INTRAMUSCULAR | Status: DC | PRN

## 2022-08-24 MED ORDER — VITAMIN B-12 1000 MCG PO TABS
1000.0000 ug | ORAL_TABLET | Freq: Every day | ORAL | Status: DC
  Administered 2022-08-25 – 2022-08-26 (×2): 1000 ug via ORAL
  Filled 2022-08-24 (×2): qty 1

## 2022-08-24 MED ORDER — ONDANSETRON HCL 4 MG PO TABS: 4.0000 mg | ORAL_TABLET | Freq: Four times a day (QID) | ORAL | Status: DC | PRN

## 2022-08-24 MED ORDER — PREDNISOLONE ACETATE 1 % OP SUSP
1.0000 [drp] | OPHTHALMIC | Status: DC
  Administered 2022-08-25: 1 [drp] via OPHTHALMIC
  Filled 2022-08-24: qty 5

## 2022-08-24 MED ORDER — SODIUM CHLORIDE 0.9 % IV SOLN: 250.0000 mL | INTRAVENOUS | Status: DC | PRN

## 2022-08-24 MED ORDER — IPRATROPIUM-ALBUTEROL 0.5-2.5 (3) MG/3ML IN SOLN: 3.0000 mL | Freq: Four times a day (QID) | RESPIRATORY_TRACT | Status: DC | PRN

## 2022-08-24 MED ORDER — ACETAMINOPHEN 650 MG RE SUPP: 650.0000 mg | Freq: Four times a day (QID) | RECTAL | Status: DC | PRN

## 2022-08-24 MED ORDER — ACETAMINOPHEN 500 MG PO TABS
1000.0000 mg | ORAL_TABLET | Freq: Once | ORAL | Status: AC
  Administered 2022-08-24: 1000 mg via ORAL
  Filled 2022-08-24: qty 2

## 2022-08-24 MED ORDER — ALBUTEROL SULFATE (2.5 MG/3ML) 0.083% IN NEBU
2.5000 mg | INHALATION_SOLUTION | RESPIRATORY_TRACT | Status: DC | PRN
Start: 2022-08-24 — End: 2022-08-24

## 2022-08-24 MED ORDER — SODIUM CHLORIDE 0.9% FLUSH
3.0000 mL | Freq: Two times a day (BID) | INTRAVENOUS | Status: DC
  Administered 2022-08-24 – 2022-08-26 (×4): 3 mL via INTRAVENOUS

## 2022-08-24 MED ORDER — POLYETHYLENE GLYCOL 3350 17 G PO PACK: 17.0000 g | PACK | Freq: Every day | ORAL | Status: DC | PRN

## 2022-08-24 MED ORDER — NITROGLYCERIN 0.4 MG SL SUBL: 0.4000 mg | SUBLINGUAL_TABLET | SUBLINGUAL | Status: DC | PRN

## 2022-08-24 MED ORDER — ONDANSETRON HCL 4 MG/2ML IJ SOLN: 4.0000 mg | Freq: Four times a day (QID) | INTRAMUSCULAR | Status: DC | PRN

## 2022-08-24 MED ORDER — ASPIRIN 81 MG PO TBEC
81.0000 mg | DELAYED_RELEASE_TABLET | Freq: Every day | ORAL | Status: DC
  Administered 2022-08-24 – 2022-08-26 (×3): 81 mg via ORAL
  Filled 2022-08-24 (×2): qty 1

## 2022-08-24 MED ORDER — ACETAMINOPHEN 325 MG PO TABS
650.0000 mg | ORAL_TABLET | Freq: Four times a day (QID) | ORAL | Status: DC | PRN
  Administered 2022-08-24: 650 mg via ORAL
  Filled 2022-08-24: qty 2

## 2022-08-24 MED ORDER — SODIUM CHLORIDE 0.9% FLUSH: 3.0000 mL | INTRAVENOUS | Status: DC | PRN

## 2022-08-24 MED ORDER — POLYETHYL GLYCOL-PROPYL GLYCOL 0.4-0.3 % OP SOLN: 1.0000 [drp] | Freq: Four times a day (QID) | OPHTHALMIC | Status: DC

## 2022-08-24 MED ORDER — ENOXAPARIN SODIUM 40 MG/0.4ML IJ SOSY
40.0000 mg | PREFILLED_SYRINGE | INTRAMUSCULAR | Status: DC
  Administered 2022-08-24: 40 mg via SUBCUTANEOUS
  Filled 2022-08-24: qty 0.4

## 2022-08-24 MED ORDER — VALACYCLOVIR HCL 500 MG PO TABS
1000.0000 mg | ORAL_TABLET | Freq: Every day | ORAL | Status: DC
  Administered 2022-08-25 – 2022-08-26 (×2): 1000 mg via ORAL
  Filled 2022-08-24 (×2): qty 2

## 2022-08-24 MED ORDER — CARVEDILOL 6.25 MG PO TABS
6.2500 mg | ORAL_TABLET | Freq: Two times a day (BID) | ORAL | Status: DC
  Administered 2022-08-24 – 2022-08-25 (×2): 6.25 mg via ORAL
  Filled 2022-08-24 (×2): qty 1

## 2022-08-24 MED ORDER — POLYVINYL ALCOHOL 1.4 % OP SOLN
1.0000 [drp] | Freq: Three times a day (TID) | OPHTHALMIC | Status: DC
  Administered 2022-08-24 – 2022-08-26 (×7): 1 [drp] via OPHTHALMIC
  Filled 2022-08-24: qty 15

## 2022-08-24 MED ORDER — VITAMIN D3 250 MCG (10000 UT) PO CAPS
10000.0000 [IU] | ORAL_CAPSULE | Freq: Every day | ORAL | Status: DC
  Filled 2022-08-24 (×2): qty 1

## 2022-08-24 NOTE — ED Triage Notes (Signed)
From home via ems for c/o fall secondary to dizziness. Intermittent dizziness the 2-3 months. Upon EMS arrival HR 170-180s. Converted following 6 mg of adenosine.  Patient reports head strike and left hip pain. Denies LOC. No blood thinners. Patient A&O x 4 at this time. VSS.

## 2022-08-24 NOTE — ED Notes (Signed)
ED TO INPATIENT HANDOFF REPORT  ED Nurse Name and Phone #: [phone]  S Name/Age/Gender Anne Bradshaw 89 y.o. female Room/Bed: 023C/023C  Code Status   Code Status: Prior  Home/SNF/Other Home Patient oriented to: self, place, time, and situation Is this baseline? Yes   Triage Complete: Triage complete  Chief Complaint Falls frequently [R29.6]  Triage Note From home via ems for c/o fall secondary to dizziness. Intermittent dizziness the 2-3 months. Upon EMS arrival HR 170-180s. Converted following 6 mg of adenosine.  Patient reports head strike and left hip pain. Denies LOC. No blood thinners. Patient A&O x 4 at this time. VSS.    Allergies Allergies  Allergen Reactions   Sulfa Antibiotics     Level of Care/Admitting Diagnosis ED Disposition     ED Disposition  Admit   Condition  --   Comment  Hospital Area: [facility] [[id number]]  Level of Care: Telemetry Medical [[id number]]  May place patient in observation at [facility] or [facility] if equivalent level of care is available:: No  Covid Evaluation: Asymptomatic - no recent exposure (last 10 days) testing not required  Diagnosis: Falls frequently [161096]  Admitting Physician: Maretta Bees [[id number]]  Attending Physician: Maretta Bees [[id number]]  Bed request comments: prefer 5 west          B Medical/Surgery History Past Medical History:  Diagnosis Date   Asthma    as child   GERD (gastroesophageal reflux disease)    High blood pressure    Iritis    Reflux    SOB (shortness of breath)    Past Surgical History:  Procedure Laterality Date   LYMPH NODE BIOPSY Right 07/29/2014   Procedure: RIGHT INGUINAL LYMPH NODE BIOPSY;  Surgeon: [name], MD;  Location: [facility];  Service: General;  Laterality: Right;   NO PAST SURGERIES     SKIN BIOPSY Right 07/29/2014   Procedure: SKIN PUNCH BIOPSY;  Surgeon: [name], MD;  Location: [facility];  Service:  General;  Laterality: Right;     A IV Location/Drains/Wounds Patient Lines/Drains/Airways Status     Active Line/Drains/Airways     Name Placement date Placement time Site Days   Peripheral IV 08/24/22 20 G Anterior;Right Forearm 08/24/22  --  Forearm  less than 1            Intake/Output Last 24 hours No intake or output data in the 24 hours ending 08/24/22 1624  Labs/Imaging Results for orders placed or performed during the hospital encounter of 08/24/22 (from the past 48 hour(s))  CBC with Differential/Platelet     Status: None   Collection Time: 08/24/22 12:09 PM  Result Value Ref Range   WBC 6.7 4.0 - 10.5 K/uL   RBC 3.97 3.87 - 5.11 MIL/uL   Hemoglobin 13.0 12.0 - 15.0 g/dL   HCT 04.5 40.9 - 81.1 %   MCV 99.7 80.0 - 100.0 fL   MCH 32.7 26.0 - 34.0 pg   MCHC 32.8 30.0 - 36.0 g/dL   RDW 91.4 78.2 - 95.6 %   Platelets 157 150 - 400 K/uL   nRBC 0.0 0.0 - 0.2 %   Neutrophils Relative % 71 %   Neutro Abs 4.8 1.7 - 7.7 K/uL   Lymphocytes Relative 19 %   Lymphs Abs 1.3 0.7 - 4.0 K/uL   Monocytes Relative 7 %   Monocytes Absolute 0.5 0.1 - 1.0 K/uL   Eosinophils Relative 2 %  Eosinophils Absolute 0.1 0.0 - 0.5 K/uL   Basophils Relative 1 %   Basophils Absolute 0.1 0.0 - 0.1 K/uL   Immature Granulocytes 0 %   Abs Immature Granulocytes 0.03 0.00 - 0.07 K/uL    Comment: Performed at [facility], [address], [place], [place] [address]  Comprehensive metabolic panel     Status: Abnormal   Collection Time: 08/24/22 12:09 PM  Result Value Ref Range   Sodium 137 135 - 145 mmol/L   Potassium 4.7 3.5 - 5.1 mmol/L   Chloride 106 98 - 111 mmol/L   CO2 25 22 - 32 mmol/L   Glucose, Bld 128 (H) 70 - 99 mg/dL    Comment: Glucose reference range applies only to samples taken after fasting for at least 8 hours.   BUN 15 8 - 23 mg/dL   Creatinine, Ser 6.04 0.44 - 1.00 mg/dL   Calcium 8.8 (L) 8.9 - 10.3 mg/dL   Total Protein 6.3 (L) 6.5 - 8.1 g/dL   Albumin 3.1 (L)  3.5 - 5.0 g/dL   AST 25 15 - 41 U/L   ALT 20 0 - 44 U/L   Alkaline Phosphatase 56 38 - 126 U/L   Total Bilirubin 0.6 0.3 - 1.2 mg/dL   GFR, Estimated 55 (L) >60 mL/min    Comment: (NOTE) Calculated using the CKD-EPI Creatinine Equation (2021)    Anion gap 6 5 - 15    Comment: Performed at [facility], [address] [place], [place] [address]  Magnesium     Status: None   Collection Time: 08/24/22 12:09 PM  Result Value Ref Range   Magnesium 2.1 1.7 - 2.4 mg/dL    Comment: Performed at [facility], [address], [place], [place] [address]  TSH     Status: None   Collection Time: 08/24/22 12:09 PM  Result Value Ref Range   TSH 3.111 0.350 - 4.500 uIU/mL    Comment: Performed by a 3rd Generation assay with a functional sensitivity of <=0.01 uIU/mL. Performed at [facility], [address] [place], [place] [address]    CT Head Wo Contrast  Result Date: 08/24/2022 CLINICAL DATA:  Provided history: Head trauma, minor. Neck trauma. Additional history provided: Fall. Dizziness. EXAM: CT HEAD WITHOUT CONTRAST CT CERVICAL SPINE WITHOUT CONTRAST TECHNIQUE: Multidetector CT imaging of the head and cervical spine was performed following the standard protocol without intravenous contrast. Multiplanar CT image reconstructions of the cervical spine were also generated. RADIATION DOSE REDUCTION: This exam was performed according to the departmental dose-optimization program which includes automated exposure control, adjustment of the mA and/or kV according to patient size and/or use of iterative reconstruction technique. COMPARISON:  Head CT 03/15/2021.  Cervical spine CT 03/11/2018. FINDINGS: CT HEAD FINDINGS Brain: Generalized cerebral atrophy. Redemonstrated chronic lacunar infarct within the left corona radiata/basal ganglia. Background mild patchy and ill-defined hypoattenuation within the cerebral white matter, nonspecific but compatible with chronic small vessel ischemic  disease. Small chronic infarct within the left cerebellar hemisphere, new from the prior head CT of 03/15/2021. There is no acute intracranial hemorrhage. No demarcated cortical infarct. No extra-axial fluid collection. No evidence of an intracranial mass. No midline shift. Vascular: No hyperdense vessel. Atherosclerotic calcifications. Skull: No calvarial fracture or aggressive osseous lesion. Unchanged chronic thinned appearance of the bilateral parietal calvarium. Sinuses/Orbits: No mass or acute finding within the imaged orbits. No significant paranasal sinus disease. CT CERVICAL SPINE FINDINGS Alignment: Mild C5-C6  grade 1 retrolisthesis. Skull base and vertebrae: The basion-dental and atlanto-dental intervals are maintained.No evidence of acute fracture to the cervical spine. 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 Soft tissues and spinal canal: No prevertebral fluid or swelling. No visible canal hematoma. Disc levels: Cervical spondylosis with multilevel disc space narrowing, disc bulges/central disc protrusions, posterior disc osteophyte complexes and uncovertebral hypertrophy. Disc space narrowing is greatest at C4-C5 and C5-C6 (moderate-to-advanced at these levels). Multilevel spinal canal narrowing. Most notably at C3-C4, a central disc protrusion contributes to apparent mild/moderate spinal canal stenosis. Bony neural foraminal narrowing on the right at C4-C5 and bilaterally at C5-C6. Upper chest: No consolidation within the imaged lung apices. No visible pneumothorax. Biapical pleuroparenchymal scarring. IMPRESSION: CT head: 1.  No evidence of an acute intracranial abnormality. 2. Parenchymal atrophy, chronic small vessel disease and chronic infarcts as described. CT cervical spine: 1. No evidence of an acute cervical spine fracture. 2. Mild C5-C6 grade 1 retrolisthesis. 3. Cervical spondylosis as described. 4. 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 Electronically Signed   By: Jackey Loge D.O.   On:  08/24/2022 15:17   CT Cervical Spine Wo Contrast  Result Date: 08/24/2022 CLINICAL DATA:  Provided history: Head trauma, minor. Neck trauma. Additional history provided: Fall. Dizziness. EXAM: CT HEAD WITHOUT CONTRAST CT CERVICAL SPINE WITHOUT CONTRAST TECHNIQUE: Multidetector CT imaging of the head and cervical spine was performed following the standard protocol without intravenous contrast. Multiplanar CT image reconstructions of the cervical spine were also generated. RADIATION DOSE REDUCTION: This exam was performed according to the departmental dose-optimization program which includes automated exposure control, adjustment of the mA and/or kV according to patient size and/or use of iterative reconstruction technique. COMPARISON:  Head CT 03/15/2021.  Cervical spine CT 03/11/2018. FINDINGS: CT HEAD FINDINGS Brain: Generalized cerebral atrophy. Redemonstrated chronic lacunar infarct within the left corona radiata/basal ganglia. Background mild patchy and ill-defined hypoattenuation within the cerebral white matter, nonspecific but compatible with chronic small vessel ischemic disease. Small chronic infarct within the left cerebellar hemisphere, new from the prior head CT of 03/15/2021. There is no acute intracranial hemorrhage. No demarcated cortical infarct. No extra-axial fluid collection. No evidence of an intracranial mass. No midline shift. Vascular: No hyperdense vessel. Atherosclerotic calcifications. Skull: No calvarial fracture or aggressive osseous lesion. Unchanged chronic thinned appearance of the bilateral parietal calvarium. Sinuses/Orbits: No mass or acute finding within the imaged orbits. No significant paranasal sinus disease. CT CERVICAL SPINE FINDINGS Alignment: Mild C5-C6 grade 1 retrolisthesis. Skull base and vertebrae: The basion-dental and atlanto-dental intervals are maintained.No evidence of acute fracture to the cervical spine. 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 Soft tissues and  spinal canal: No prevertebral fluid or swelling. No visible canal hematoma. Disc levels: Cervical spondylosis with multilevel disc space narrowing, disc bulges/central disc protrusions, posterior disc osteophyte complexes and uncovertebral hypertrophy. Disc space narrowing is greatest at C4-C5 and C5-C6 (moderate-to-advanced at these levels). Multilevel spinal canal narrowing. Most notably at C3-C4, a central disc protrusion contributes to apparent mild/moderate spinal canal stenosis. Bony neural foraminal narrowing on the right at C4-C5 and bilaterally at C5-C6. Upper chest: No consolidation within the imaged lung apices. No visible pneumothorax. Biapical pleuroparenchymal scarring. IMPRESSION: CT head: 1.  No evidence of an acute intracranial abnormality. 2. Parenchymal atrophy, chronic small vessel disease and chronic infarcts as described. CT cervical spine: 1. No evidence of an acute cervical spine fracture. 2. Mild C5-C6 grade 1 retrolisthesis. 3. Cervical spondylosis as described. 4. Facet ankylosis on  the right at C2-C3. Electronically Signed   By: Jackey Loge D.O.   On: 08/24/2022 15:17   DG Chest Port 1 View  Result Date: 08/24/2022 CLINICAL DATA:  Intermittent dizziness for 2-3 months.  Fall. EXAM: PORTABLE CHEST 1 VIEW COMPARISON:  Chest radiograph 08/25/2021. FINDINGS: The cardiomediastinal silhouette is normal There is no focal consolidation or pulmonary edema. There is no pleural effusion or pneumothorax. Biapical pleural scarring is unchanged. There is no acute osseous abnormality. IMPRESSION: Stable chest with no radiographic evidence of acute cardiopulmonary process. Electronically Signed   By: Lesia Hausen M.D.   On: 08/24/2022 13:02   DG Hip Unilat W or Wo Pelvis 2-3 Views Left  Result Date: 08/24/2022 CLINICAL DATA:  Fall with pain to the left lower extremity EXAM: DG HIP (WITH OR WITHOUT PELVIS) 3V LEFT COMPARISON:  None Available. FINDINGS: There is no evidence of hip fracture or  dislocation. There is no evidence of arthropathy or other focal bone abnormality. Capsular radiodensity projects over the left hemiabdomen, likely ingested within bowel. Surgical clips project over the right hip. IMPRESSION: No acute fracture or dislocation. Electronically Signed   By: Agustin Cree M.D.   On: 08/24/2022 13:00    Pending Labs Unresulted Labs (From admission, onward)    None       Vitals/Pain Today's Vitals   08/24/22 1400 08/24/22 1421 08/24/22 1515 08/24/22 1530  BP: 131/74  128/68 133/60  Pulse: 72  67 67  Resp: (!) 21  (!) 23 (!) 21  Temp:      TempSrc:      SpO2: 96%  94% 94%  Weight:      Height:      PainSc:  5       Isolation Precautions No active isolations  Medications Medications  acetaminophen (TYLENOL) tablet 1,000 mg (1,000 mg Oral Given 08/24/22 1238)    Mobility walks     Focused Assessments Cardiac Assessment Handoff:  Cardiac Rhythm: Normal sinus rhythm Lab Results  Component Value Date   TROPONINI 0.03 (HH) 03/13/2018   No results found for: "DDIMER" Does the Patient currently have chest pain? No    R Recommendations: See Admitting Provider Note  Report given to:   Additional Notes: n/a

## 2022-08-24 NOTE — Consult Note (Addendum)
Cardiology Consultation   Patient ID: Anne Bradshaw MRN: [id number]; DOB: [birth year]  Admit date: 08/24/2022 Date of Consult: 08/24/2022  PCP:  Clovis Riley, L.August Saucer, MD   [facility] Providers Cardiologist:  Verne Carrow, MD   {  Patient Profile:   Anne Bradshaw is a 89 y.o. female with a hx of GERD, hypertension, who is being seen 08/24/2022 for the evaluation of supraventricular tachycardia/dizziness at the request of Dr. Jerral Ralph.  History of Present Illness:   Ms. Stelmack has no prior significant cardiac history.  She was last seen on July 2023 by Dr. Clifton James for evaluation of dyspnea from prior ED admission.  Overall negative workup from these complaints.  No ischemic changes on EKG and negative troponins.  Overall was feeling well during visit.  An echocardiogram was ordered to evaluate a heart murmur/SOB.  Patient had normal LVEF of 60 to 65%.  She had moderate grade 3 protruding plaque in the descending aorta.  No valvular abnormalities were noted.  Currently patient is being seen in the ED for multiple episodes of dizziness and SVT. It was reported that during an episode today patient sustained a fall and hit the back of her head on the floor and has some residual left hip pain.  EMS on arrival evaluated patient and was found to be in SVT, heart rates 170s.  Valsalva maneuver was attempted however was unsuccessful.  However was converted back to normal sinus rhythm after 6 mg of adenosine was administered. HR back to 80s.  Entire documented episode lasted about 15 minutes.  Patient states that she has had numerous dizzy spells since January.  She states that these occurrences have been going on more frequently and occur around 3-4 times per week.  She states that with the onset of these dizzy spells that her head feels like it is "swimming".  She feels unstable and often times has to lean against nearby walls or objects.  She denies any palpitations, chest pain,  shortness of breath with these episodes.  Today she states that she was on the phone with her daughter and experienced 1 of these episodes and quickly grabbed a door frame but was unable to hold herself up.  She sustained a fall on her left hip and hit her head however did not lose any consciousness.  At that time EMS arrived with events outlined as above.  She denies any chest pain, palpitations, orthopnea, peripheral edema, syncopal episodes.  ED workup: Head CT chest x-ray without any significant findings.  Cervical spine negative for any fractures.  No electrolyte abnormalities.  No other significant lab findings.  Normal TSH. Past Medical History:  Diagnosis Date   Asthma    as child   GERD (gastroesophageal reflux disease)    High blood pressure    Iritis    Reflux    SOB (shortness of breath)     Past Surgical History:  Procedure Laterality Date   LYMPH NODE BIOPSY Right 07/29/2014   Procedure: RIGHT INGUINAL LYMPH NODE BIOPSY;  Surgeon: [name], MD;  Location: [facility];  Service: General;  Laterality: Right;   NO PAST SURGERIES     SKIN BIOPSY Right 07/29/2014   Procedure: SKIN PUNCH BIOPSY;  Surgeon: [name], MD;  Location: [facility];  Service: General;  Laterality: Right;     Inpatient Medications: Scheduled Meds:  Continuous Infusions:  PRN Meds:   Allergies:    Allergies  Allergen Reactions   Sulfa  Antibiotics     Social History:   Social History   Socioeconomic History   Marital status: Married    Spouse name: Not on file   Number of children: 2   Years of education: Not on file   Highest education level: Not on file  Occupational History   Occupation: Retired  Tobacco Use   Smoking status: Never   Smokeless tobacco: Never  Vaping Use   Vaping Use: Never used  Substance and Sexual Activity   Alcohol use: No    Alcohol/week: 0.0 standard drinks of alcohol   Drug use: No   Sexual activity: Not on file  Other  Topics Concern   Not on file  Social History Narrative   Not on file   Social Determinants of Health   Financial Resource Strain: Not on file  Food Insecurity: Not on file  Transportation Needs: Not on file  Physical Activity: Not on file  Stress: Not on file  Social Connections: Not on file  Intimate Partner Violence: Not on file    Family History:   Family History  Problem Relation Age of Onset   Congestive Heart Failure Father    Congestive Heart Failure Brother    Congestive Heart Failure Paternal Grandmother      ROS:  Please see the history of present illness.  All other ROS reviewed and negative.     Physical Exam/Data:   Vitals:   08/24/22 1215 08/24/22 1400 08/24/22 1515 08/24/22 1530  BP: 131/64 131/74 128/68 133/60  Pulse: 82 72 67 67  Resp: (!) 26 (!) 21 (!) 23 (!) 21  Temp:      TempSrc:      SpO2: 94% 96% 94% 94%  Weight:      Height:       No intake or output data in the 24 hours ending 08/24/22 1602    08/24/2022   11:58 AM 09/16/2021    1:52 PM 05/10/2019    1:05 PM  Last 3 Weights  Weight (lbs) 109 lb 119 lb 12.8 oz 120 lb  Weight (kg) 49.442 kg 54.341 kg 54.432 kg     Body mass index is 18.71 kg/m.  General:  Well nourished, well developed, in no acute distress HEENT: normal Neck: no JVD Vascular: No carotid bruits; Distal pulses 2+ bilaterally Cardiac:  normal S1, S2; RRR; no murmur  Lungs:  clear to auscultation bilaterally, no wheezing, rhonchi or rales  Abd: soft, nontender, no hepatomegaly  Ext: no edema Musculoskeletal:  No deformities, BUE and BLE strength normal and equal Skin: warm and dry  Neuro:  CNs 2-12 intact, no focal abnormalities noted Psych:  Normal affect   EKG:  The EKG was personally reviewed and demonstrates:   EKG shows normal sinus rhythm, heart rate 82.  Left axis deviation. Telemetry:  Telemetry was personally reviewed and demonstrates: Normal sinus rhythm heart rate 60-80  Relevant CV  Studies: Echocardiogram 10/01/2021 1. Left ventricular ejection fraction, by estimation, is 60 to 65%. The  left ventricle has normal function. The left ventricle has no regional  wall motion abnormalities. Left ventricular diastolic parameters are  consistent with Grade I diastolic  dysfunction (impaired relaxation).   2. Right ventricular systolic function is normal. The right ventricular  size is normal. There is normal pulmonary artery systolic pressure. The  estimated right ventricular systolic pressure is 27.8 mmHg.   3. The mitral valve is myxomatous. Trivial mitral valve regurgitation. No  evidence of mitral stenosis.  4. The aortic valve is tricuspid. Aortic valve regurgitation is trivial.  Aortic valve sclerosis is present, with no evidence of aortic valve  stenosis.   5. There is Moderate (Grade III) protruding plaque involving the  descending aorta.   6. The inferior vena cava is normal in size with greater than 50%  respiratory variability, suggesting right atrial pressure of 3 mmHg.   Laboratory Data:  High Sensitivity Troponin:  No results for input(s): "TROPONINIHS" in the last 720 hours.   Chemistry Recent Labs  Lab 08/24/22 1209  NA 137  K 4.7  CL 106  CO2 25  GLUCOSE 128*  BUN 15  CREATININE 0.97  CALCIUM 8.8*  MG 2.1  GFRNONAA 55*  ANIONGAP 6    Recent Labs  Lab 08/24/22 1209  PROT 6.3*  ALBUMIN 3.1*  AST 25  ALT 20  ALKPHOS 56  BILITOT 0.6   Lipids No results for input(s): "CHOL", "TRIG", "HDL", "LABVLDL", "LDLCALC", "CHOLHDL" in the last 168 hours.  Hematology Recent Labs  Lab 08/24/22 1209  WBC 6.7  RBC 3.97  HGB 13.0  HCT 39.6  MCV 99.7  MCH 32.7  MCHC 32.8  RDW 13.9  PLT 157   Thyroid  Recent Labs  Lab 08/24/22 1209  TSH 3.111    BNPNo results for input(s): "BNP", "PROBNP" in the last 168 hours.  DDimer No results for input(s): "DDIMER" in the last 168 hours.   Radiology/Studies:  CT Head Wo Contrast  Result Date:  08/24/2022 CLINICAL DATA:  Provided history: Head trauma, minor. Neck trauma. Additional history provided: Fall. Dizziness. EXAM: CT HEAD WITHOUT CONTRAST CT CERVICAL SPINE WITHOUT CONTRAST TECHNIQUE: Multidetector CT imaging of the head and cervical spine was performed following the standard protocol without intravenous contrast. Multiplanar CT image reconstructions of the cervical spine were also generated. RADIATION DOSE REDUCTION: This exam was performed according to the departmental dose-optimization program which includes automated exposure control, adjustment of the mA and/or kV according to patient size and/or use of iterative reconstruction technique. COMPARISON:  Head CT 03/15/2021.  Cervical spine CT 03/11/2018. FINDINGS: CT HEAD FINDINGS Brain: Generalized cerebral atrophy. Redemonstrated chronic lacunar infarct within the left corona radiata/basal ganglia. Background mild patchy and ill-defined hypoattenuation within the cerebral white matter, nonspecific but compatible with chronic small vessel ischemic disease. Small chronic infarct within the left cerebellar hemisphere, new from the prior head CT of 03/15/2021. There is no acute intracranial hemorrhage. No demarcated cortical infarct. No extra-axial fluid collection. No evidence of an intracranial mass. No midline shift. Vascular: No hyperdense vessel. Atherosclerotic calcifications. Skull: No calvarial fracture or aggressive osseous lesion. Unchanged chronic thinned appearance of the bilateral parietal calvarium. Sinuses/Orbits: No mass or acute finding within the imaged orbits. No significant paranasal sinus disease. CT CERVICAL SPINE FINDINGS Alignment: Mild C5-C6 grade 1 retrolisthesis. Skull base and vertebrae: The basion-dental and atlanto-dental intervals are maintained.No evidence of acute fracture to the cervical spine. 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 Soft tissues and spinal canal: No prevertebral fluid or swelling. No visible canal  hematoma. Disc levels: Cervical spondylosis with multilevel disc space narrowing, disc bulges/central disc protrusions, posterior disc osteophyte complexes and uncovertebral hypertrophy. Disc space narrowing is greatest at C4-C5 and C5-C6 (moderate-to-advanced at these levels). Multilevel spinal canal narrowing. Most notably at C3-C4, a central disc protrusion contributes to apparent mild/moderate spinal canal stenosis. Bony neural foraminal narrowing on the right at C4-C5 and bilaterally at C5-C6. Upper chest: No consolidation within the imaged lung apices. No visible pneumothorax.  Biapical pleuroparenchymal scarring. IMPRESSION: CT head: 1.  No evidence of an acute intracranial abnormality. 2. Parenchymal atrophy, chronic small vessel disease and chronic infarcts as described. CT cervical spine: 1. No evidence of an acute cervical spine fracture. 2. Mild C5-C6 grade 1 retrolisthesis. 3. Cervical spondylosis as described. 4. 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 Electronically Signed   By: Jackey Loge D.O.   On: 08/24/2022 15:17   CT Cervical Spine Wo Contrast  Result Date: 08/24/2022 CLINICAL DATA:  Provided history: Head trauma, minor. Neck trauma. Additional history provided: Fall. Dizziness. EXAM: CT HEAD WITHOUT CONTRAST CT CERVICAL SPINE WITHOUT CONTRAST TECHNIQUE: Multidetector CT imaging of the head and cervical spine was performed following the standard protocol without intravenous contrast. Multiplanar CT image reconstructions of the cervical spine were also generated. RADIATION DOSE REDUCTION: This exam was performed according to the departmental dose-optimization program which includes automated exposure control, adjustment of the mA and/or kV according to patient size and/or use of iterative reconstruction technique. COMPARISON:  Head CT 03/15/2021.  Cervical spine CT 03/11/2018. FINDINGS: CT HEAD FINDINGS Brain: Generalized cerebral atrophy. Redemonstrated chronic lacunar infarct within the left  corona radiata/basal ganglia. Background mild patchy and ill-defined hypoattenuation within the cerebral white matter, nonspecific but compatible with chronic small vessel ischemic disease. Small chronic infarct within the left cerebellar hemisphere, new from the prior head CT of 03/15/2021. There is no acute intracranial hemorrhage. No demarcated cortical infarct. No extra-axial fluid collection. No evidence of an intracranial mass. No midline shift. Vascular: No hyperdense vessel. Atherosclerotic calcifications. Skull: No calvarial fracture or aggressive osseous lesion. Unchanged chronic thinned appearance of the bilateral parietal calvarium. Sinuses/Orbits: No mass or acute finding within the imaged orbits. No significant paranasal sinus disease. CT CERVICAL SPINE FINDINGS Alignment: Mild C5-C6 grade 1 retrolisthesis. Skull base and vertebrae: The basion-dental and atlanto-dental intervals are maintained.No evidence of acute fracture to the cervical spine. 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 Soft tissues and spinal canal: No prevertebral fluid or swelling. No visible canal hematoma. Disc levels: Cervical spondylosis with multilevel disc space narrowing, disc bulges/central disc protrusions, posterior disc osteophyte complexes and uncovertebral hypertrophy. Disc space narrowing is greatest at C4-C5 and C5-C6 (moderate-to-advanced at these levels). Multilevel spinal canal narrowing. Most notably at C3-C4, a central disc protrusion contributes to apparent mild/moderate spinal canal stenosis. Bony neural foraminal narrowing on the right at C4-C5 and bilaterally at C5-C6. Upper chest: No consolidation within the imaged lung apices. No visible pneumothorax. Biapical pleuroparenchymal scarring. IMPRESSION: CT head: 1.  No evidence of an acute intracranial abnormality. 2. Parenchymal atrophy, chronic small vessel disease and chronic infarcts as described. CT cervical spine: 1. No evidence of an acute cervical spine  fracture. 2. Mild C5-C6 grade 1 retrolisthesis. 3. Cervical spondylosis as described. 4. 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 Electronically Signed   By: Jackey Loge D.O.   On: 08/24/2022 15:17   DG Chest Port 1 View  Result Date: 08/24/2022 CLINICAL DATA:  Intermittent dizziness for 2-3 months.  Fall. EXAM: PORTABLE CHEST 1 VIEW COMPARISON:  Chest radiograph 08/25/2021. FINDINGS: The cardiomediastinal silhouette is normal There is no focal consolidation or pulmonary edema. There is no pleural effusion or pneumothorax. Biapical pleural scarring is unchanged. There is no acute osseous abnormality. IMPRESSION: Stable chest with no radiographic evidence of acute cardiopulmonary process. Electronically Signed   By: Lesia Hausen M.D.   On: 08/24/2022 13:02   DG Hip Unilat W or Wo Pelvis 2-3 Views Left  Result  Date: 08/24/2022 CLINICAL DATA:  Fall with pain to the left lower extremity EXAM: DG HIP (WITH OR WITHOUT PELVIS) 3V LEFT COMPARISON:  None Available. FINDINGS: There is no evidence of hip fracture or dislocation. There is no evidence of arthropathy or other focal bone abnormality. Capsular radiodensity projects over the left hemiabdomen, likely ingested within bowel. Surgical clips project over the right hip. IMPRESSION: No acute fracture or dislocation. Electronically Signed   By: Agustin Cree M.D.   On: 08/24/2022 13:00     Assessment and Plan:   Supraventricular tachycardia Patient presenting with multiple episodes of dizziness and unsteady balance since January.  These events are occurring 3-4 times a week.  Initial Valsalva maneuvers were unsuccessful, however patient converted back to normal sinus rhythm after 6 mg of adenosine.  She is maintaining normal sinus rhythm with heart rate 60-80s now.  No evidence of any thyroid dysfunction or electrolyte imbalances. Will order echocardiogram Currently on carvedilol 3.125 mg twice daily, this has been increased to 6.25 mg twice daily.  Need to monitor  very closely for bradycardia/symptoms given patient's advanced age, high risk for falls, borderline bradycardia.   Discussed with MD, there may be concerns of post termination pauses leading to her fall. As such, will request EP to see as well.  Consider cardiac monitor to ensure no other arrhythmias.  HTN On BB as above.   Fall Patient sustained injury to her hip and hit her head. Imagine negative. Not on any blood thinners.   Risk Assessment/Risk Scores:   For questions or updates, please contact [facility] Please consult [email or web address] for contact info under    Signed, Abagail Kitchens, PA-C  08/24/2022 4:02 PM  As above, patient seen and examined.  Briefly she is a 89 year old female with past medical history of hypertension who is being evaluated for SVT and near syncopal episode.  Echocardiogram August 2023 showed normal LV function, grade 1 diastolic dysfunction and trace aortic insufficiency.  Patient typically has some fatigue but denies dyspnea on exertion, orthopnea, PND, pedal edema, exertional chest pain or syncope.  Occasional palpitations.  Over the past year she has had intermittent dizzy spells.  They are sudden in onset and not related to position.  No associated palpitations, chest pain, nausea or dyspnea.  Typically lasts seconds and resolve spontaneously though she feels weak afterwards.  Today she was standing and developed sudden onset of dizziness.  She tried to balance herself on the door frame but she felt weak and fell.  Unclear if she lost consciousness.  She did strike her hip and head.  EMS was called and when they arrived she was in SVT at a rate of 182.  She was given adenosine and converted to sinus rhythm.  Cardiology now asked to evaluate.  Electrocardiogram shows normal sinus rhythm with no ST changes.  Sodium 137, potassium 4.7, creatinine 0.97, BUN 15, hemoglobin 13, TSH 3.111.  1 presyncope-etiology unclear but episodes have begun in the past 1 year  and have become more frequent.  She had a severe episode today resulting in a fall; she is unclear if she had frank syncope but may have.  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  She was given 6 mg of adenosine and converted to sinus rhythm.  I am concerned that the SVT contributed to her symptoms either by vasodilation or by posttermination pauses.  We will increase carvedilol to 6.25 mg twice daily.  Schedule echocardiogram to assess LV function.  Will have electrophysiology see patient given that she is having more frequent symptomatic episodes with now possible syncope and fall.  May need ablation.  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  3 hypertension-continue carvedilol.  Follow blood pressure and advance as needed.  Olga Millers, MD

## 2022-08-24 NOTE — ED Provider Notes (Signed)
[place] EMERGENCY DEPARTMENT AT [facility] Provider Note   CSN: [id number] Arrival date & time: 08/24/22  1143     History  Chief Complaint  Patient presents with   Fall   Tachycardia    Anne Bradshaw is a 89 y.o. female.  Patient is a 89 year old female with a history of GERD, asthma, hypertension who is presenting today after a fall at home associated with a dizzy spell.  She reports that since January she has been having these episodes of dizzy spells.  She states it feels like her head is spinning and electrical sound in her ear and she usually has to hold onto something.  She is followed up with her PCP and the cardiologist had an echo and has been told they are not sure what is causing the symptoms.  She reports today she was on the phone with her daughter had just had breakfast and was walking into the bedroom when she felt a dizzy episode coming on.  She grabbed onto the door frame but reports this time she could not hold herself up and she fell onto her left hip and the back of her head the floor.  She remembers the entire event and does not think she lost consciousness.  Her husband called 911 and when EMS arrived they noted that patient was in SVT.  She reports by the time the paramedic got there her dizzy spell had subsided she was not having any chest pain, shortness of breath or palpitations.  No nausea or vomiting.  EMS reports that she converted after 6 mg of adenosine.  She is currently complaining of a headache and left hip pain.  She did not stand or walk after this event.  The history is provided by the patient and the EMS personnel.  Fall       Home Medications Prior to Admission medications   Medication Sig Start Date End Date Taking? Authorizing Provider  acetaminophen (TYLENOL) 325 MG tablet Take 650 mg by mouth daily as needed for mild pain.   Yes [provider]  carvedilol (COREG) 3.125 MG tablet Take 1 tablet (3.125 mg total) by  mouth 2 (two) times daily with a meal. 03/16/18  Yes Barnetta Chapel, MD  Cholecalciferol (VITAMIN D3) 250 MCG (10000 UT) capsule Take 10,000 Units by mouth daily.   Yes [provider]  famciclovir (FAMVIR) 500 MG tablet TAKE 1 TABLET BY MOUTH EVERY DAY Patient taking differently: Take 250 mg by mouth daily. 03/29/21  Yes Rankin, Alford Highland, MD  nitroGLYCERIN (NITROSTAT) 0.4 MG SL tablet Take one every five minutes under your tongue for up to 3 doses if you have onset of  shortness of breath. 08/25/21  Yes Harris, Abigail, PA-C  Polyethyl Glycol-Propyl Glycol (SYSTANE) 0.4-0.3 % SOLN Apply 1 drop to eye 4 (four) times daily.   Yes [provider]  prednisoLONE acetate (PRED FORTE) 1 % ophthalmic suspension 1 DROP IN BOTH EYES EVERY OTHER DAY Patient taking differently: Place 1 drop into both eyes See admin instructions. 1 DROP IN BOTH EYES EVERY OTHER DAY 07/27/21  Yes Rankin, Alford Highland, MD  vitamin B-12 (CYANOCOBALAMIN) 1000 MCG tablet Take 1,000 mcg by mouth daily.   Yes [provider]  aspirin EC 81 MG tablet Take 1 tablet (81 mg total) by mouth daily. Swallow whole. Patient not taking: Reported on 08/24/2022 08/25/21   Arthor Captain, PA-C  ipratropium-albuterol (DUONEB) 0.5-2.5 (3) MG/3ML SOLN Take 3 mLs by nebulization  every 6 (six) hours as needed. Patient not taking: Reported on 08/24/2022 03/16/18   Barnetta Chapel, MD      Allergies    Sulfa antibiotics    Review of Systems   Review of Systems  Physical Exam Updated Vital Signs BP 131/74   Pulse 72   Temp (!) 97.5 F (36.4 C) (Oral)   Resp (!) 21   Ht 5\' 4"  (1.626 m)   Wt 49.4 kg   SpO2 96%   BMI 18.71 kg/m  Physical Exam Vitals and nursing note reviewed.  Constitutional:      General: She is not in acute distress.    Appearance: She is well-developed and normal weight.  HENT:     Head: Normocephalic and atraumatic.  Eyes:     Pupils: Pupils are equal, round, and reactive to light.  Cardiovascular:      Rate and Rhythm: Normal rate and regular rhythm.     Pulses: Normal pulses.     Heart sounds: Normal heart sounds. No murmur heard.    No friction rub.  Pulmonary:     Effort: Pulmonary effort is normal.     Breath sounds: Normal breath sounds. No wheezing or rales.  Abdominal:     General: Bowel sounds are normal. There is no distension.     Palpations: Abdomen is soft.     Tenderness: There is no abdominal tenderness. There is no guarding or rebound.  Musculoskeletal:        General: Tenderness present. Normal range of motion.     Cervical back: Neck supple. Spinous process tenderness present. No muscular tenderness. Normal range of motion.     Left hip: Tenderness present. No deformity. Normal range of motion. Normal strength.     Right lower leg: No edema.     Left lower leg: No edema.       Legs:     Comments: No edema  Skin:    General: Skin is warm and dry.     Findings: No rash.  Neurological:     Mental Status: She is alert and oriented to person, place, and time.     Cranial Nerves: No cranial nerve deficit.  Psychiatric:        Behavior: Behavior normal.     ED Results / Procedures / Treatments   Labs (all labs ordered are listed, but only abnormal results are displayed) Labs Reviewed  COMPREHENSIVE METABOLIC PANEL - Abnormal; Notable for the following components:      Result Value   Glucose, Bld 128 (*)    Calcium 8.8 (*)    Total Protein 6.3 (*)    Albumin 3.1 (*)    GFR, Estimated 55 (*)    All other components within normal limits  CBC WITH DIFFERENTIAL/PLATELET  MAGNESIUM  TSH    EKG EKG Interpretation Date/Time:  Wednesday August 24 2022 11:57:57 EDT Ventricular Rate:  82 PR Interval:  160 QRS Duration:  88 QT Interval:  400 QTC Calculation: 468 R Axis:   -38  Text Interpretation: Sinus rhythm Left axis deviation Abnormal R-wave progression, early transition Borderline T wave abnormalities No significant change since last tracing Confirmed  by Gwyneth Sprout ([id number]) on 08/24/2022 12:05:12 PM  Radiology CT Head Wo Contrast  Result Date: 08/24/2022 CLINICAL DATA:  Provided history: Head trauma, minor. Neck trauma. Additional history provided: Fall. Dizziness. EXAM: CT HEAD WITHOUT CONTRAST CT CERVICAL SPINE WITHOUT CONTRAST TECHNIQUE: Multidetector CT imaging of the head and cervical spine was performed  following the standard protocol without intravenous contrast. Multiplanar CT image reconstructions of the cervical spine were also generated. RADIATION DOSE REDUCTION: This exam was performed according to the departmental dose-optimization program which includes automated exposure control, adjustment of the mA and/or kV according to patient size and/or use of iterative reconstruction technique. COMPARISON:  Head CT 03/15/2021.  Cervical spine CT 03/11/2018. FINDINGS: CT HEAD FINDINGS Brain: Generalized cerebral atrophy. Redemonstrated chronic lacunar infarct within the left corona radiata/basal ganglia. Background mild patchy and ill-defined hypoattenuation within the cerebral white matter, nonspecific but compatible with chronic small vessel ischemic disease. Small chronic infarct within the left cerebellar hemisphere, new from the prior head CT of 03/15/2021. There is no acute intracranial hemorrhage. No demarcated cortical infarct. No extra-axial fluid collection. No evidence of an intracranial mass. No midline shift. Vascular: No hyperdense vessel. Atherosclerotic calcifications. Skull: No calvarial fracture or aggressive osseous lesion. Unchanged chronic thinned appearance of the bilateral parietal calvarium. Sinuses/Orbits: No mass or acute finding within the imaged orbits. No significant paranasal sinus disease. CT CERVICAL SPINE FINDINGS Alignment: Mild C5-C6 grade 1 retrolisthesis. Skull base and vertebrae: The basion-dental and atlanto-dental intervals are maintained.No evidence of acute fracture to the cervical spine. Facet ankylosis on  the right at C2-C3. Soft tissues and spinal canal: No prevertebral fluid or swelling. No visible canal hematoma. Disc levels: Cervical spondylosis with multilevel disc space narrowing, disc bulges/central disc protrusions, posterior disc osteophyte complexes and uncovertebral hypertrophy. Disc space narrowing is greatest at C4-C5 and C5-C6 (moderate-to-advanced at these levels). Multilevel spinal canal narrowing. Most notably at C3-C4, a central disc protrusion contributes to apparent mild/moderate spinal canal stenosis. Bony neural foraminal narrowing on the right at C4-C5 and bilaterally at C5-C6. Upper chest: No consolidation within the imaged lung apices. No visible pneumothorax. Biapical pleuroparenchymal scarring. IMPRESSION: CT head: 1.  No evidence of an acute intracranial abnormality. 2. Parenchymal atrophy, chronic small vessel disease and chronic infarcts as described. CT cervical spine: 1. No evidence of an acute cervical spine fracture. 2. Mild C5-C6 grade 1 retrolisthesis. 3. Cervical spondylosis as described. 4. 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 Electronically Signed   By: Jackey Loge D.O.   On: 08/24/2022 15:17   CT Cervical Spine Wo Contrast  Result Date: 08/24/2022 CLINICAL DATA:  Provided history: Head trauma, minor. Neck trauma. Additional history provided: Fall. Dizziness. EXAM: CT HEAD WITHOUT CONTRAST CT CERVICAL SPINE WITHOUT CONTRAST TECHNIQUE: Multidetector CT imaging of the head and cervical spine was performed following the standard protocol without intravenous contrast. Multiplanar CT image reconstructions of the cervical spine were also generated. RADIATION DOSE REDUCTION: This exam was performed according to the departmental dose-optimization program which includes automated exposure control, adjustment of the mA and/or kV according to patient size and/or use of iterative reconstruction technique. COMPARISON:  Head CT 03/15/2021.  Cervical spine CT 03/11/2018. FINDINGS: CT  HEAD FINDINGS Brain: Generalized cerebral atrophy. Redemonstrated chronic lacunar infarct within the left corona radiata/basal ganglia. Background mild patchy and ill-defined hypoattenuation within the cerebral white matter, nonspecific but compatible with chronic small vessel ischemic disease. Small chronic infarct within the left cerebellar hemisphere, new from the prior head CT of 03/15/2021. There is no acute intracranial hemorrhage. No demarcated cortical infarct. No extra-axial fluid collection. No evidence of an intracranial mass. No midline shift. Vascular: No hyperdense vessel. Atherosclerotic calcifications. Skull: No calvarial fracture or aggressive osseous lesion. Unchanged chronic thinned appearance of the bilateral parietal calvarium. Sinuses/Orbits: No mass or acute finding within the imaged orbits. No significant  paranasal sinus disease. CT CERVICAL SPINE FINDINGS Alignment: Mild C5-C6 grade 1 retrolisthesis. Skull base and vertebrae: The basion-dental and atlanto-dental intervals are maintained.No evidence of acute fracture to the cervical spine. 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 Soft tissues and spinal canal: No prevertebral fluid or swelling. No visible canal hematoma. Disc levels: Cervical spondylosis with multilevel disc space narrowing, disc bulges/central disc protrusions, posterior disc osteophyte complexes and uncovertebral hypertrophy. Disc space narrowing is greatest at C4-C5 and C5-C6 (moderate-to-advanced at these levels). Multilevel spinal canal narrowing. Most notably at C3-C4, a central disc protrusion contributes to apparent mild/moderate spinal canal stenosis. Bony neural foraminal narrowing on the right at C4-C5 and bilaterally at C5-C6. Upper chest: No consolidation within the imaged lung apices. No visible pneumothorax. Biapical pleuroparenchymal scarring. IMPRESSION: CT head: 1.  No evidence of an acute intracranial abnormality. 2. Parenchymal atrophy, chronic small vessel  disease and chronic infarcts as described. CT cervical spine: 1. No evidence of an acute cervical spine fracture. 2. Mild C5-C6 grade 1 retrolisthesis. 3. Cervical spondylosis as described. 4. 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 Electronically Signed   By: Jackey Loge D.O.   On: 08/24/2022 15:17   DG Chest Port 1 View  Result Date: 08/24/2022 CLINICAL DATA:  Intermittent dizziness for 2-3 months.  Fall. EXAM: PORTABLE CHEST 1 VIEW COMPARISON:  Chest radiograph 08/25/2021. FINDINGS: The cardiomediastinal silhouette is normal There is no focal consolidation or pulmonary edema. There is no pleural effusion or pneumothorax. Biapical pleural scarring is unchanged. There is no acute osseous abnormality. IMPRESSION: Stable chest with no radiographic evidence of acute cardiopulmonary process. Electronically Signed   By: Lesia Hausen M.D.   On: 08/24/2022 13:02   DG Hip Unilat W or Wo Pelvis 2-3 Views Left  Result Date: 08/24/2022 CLINICAL DATA:  Fall with pain to the left lower extremity EXAM: DG HIP (WITH OR WITHOUT PELVIS) 3V LEFT COMPARISON:  None Available. FINDINGS: There is no evidence of hip fracture or dislocation. There is no evidence of arthropathy or other focal bone abnormality. Capsular radiodensity projects over the left hemiabdomen, likely ingested within bowel. Surgical clips project over the right hip. IMPRESSION: No acute fracture or dislocation. Electronically Signed   By: Agustin Cree M.D.   On: 08/24/2022 13:00    Procedures Procedures    Medications Ordered in ED Medications  acetaminophen (TYLENOL) tablet 1,000 mg (1,000 mg Oral Given 08/24/22 1238)    ED Course/ Medical Decision Making/ A&P                             Medical Decision Making Amount and/or Complexity of Data Reviewed External Data Reviewed: notes. Labs: ordered. Decision-making details documented in ED Course. Radiology: ordered and independent interpretation performed. Decision-making details documented in ED  Course. ECG/medicine tests: ordered and independent interpretation performed. Decision-making details documented in ED Course.  Risk OTC drugs. Decision regarding hospitalization.   Pt with multiple medical problems and comorbidities and presenting today with a complaint that caries a high risk for morbidity and mortality.  Here today after an episode of dizziness that caused a fall.  From the fall she is now having pain in her left hip and her head.  She takes no anticoagulation at this time and denies loss of consciousness.  The dizzy episode is concerning and may be related to dysrhythmia.  Patient was found to be in SVT by EMS upon their arrival and did convert with  6 mg of adenosine.  Will ensure electrolytes are within normal limits, no evidence of thyroid disease.  Patient also having headache and hip pain after the fall and will image to ensure no acute fractures.  Patient given Tylenol for pain.  Low suspicion at this time for PE or MI.  She does not take anticoagulation.  She is on continuous cardiac monitoring at this time and heart rates are normal.  I independently interpreted patient's EKG which shows a sinus rhythm without acute findings.  3:49 PM I independently interpreted patient's labs and CBC, CMP, magnesium and TSH all without acute findings.  I have independently visualized and interpreted pt's images today.  Head CT and chest x-ray without acute findings today.  Cervical spine negative for fracture.  Findings discussed with the patient.  However on further questioning patient reports that the dizzy episodes have become much more frequent and she is now having them almost every day.  She reports also falling a few days ago because of the similar symptoms.  Given the known SVT she had today, frequent episodes now with near syncope and falls will admit for above as patient will most likely need to be started on medication for rate control and ongoing monitoring.  This was discussed  with the patient and they were comfortable with this plan.  Will admit to the hospitalist.  Cards consulted          Final Clinical Impression(s) / ED Diagnoses Final diagnoses:  SVT (supraventricular tachycardia)  Near syncope  Fall, initial encounter    Rx / DC Orders ED Discharge Orders     None         Gwyneth Sprout, MD 08/24/22 1549

## 2022-08-24 NOTE — H&P (Signed)
History and Physical    Patient: Anne Bradshaw ZOX:[id number] DOB: [birth year] DOA: 08/24/2022 DOS: the patient was seen and examined on 08/24/2022 PCP: Clovis Riley, L.August Saucer, MD  Patient coming from: Home  Chief Complaint:  Chief Complaint  Patient presents with   Fall   Tachycardia   HPI: Anne Bradshaw is a 89 y.o. female with medical history significant of hypertension, GERD, macular degeneration who presented to the hospital following a mechanical fall associated with a presyncopal episode.  Per patient-she has had numerous "dizzy spells" since January.  Per patient-she was on the phone with her daughter earlier this morning-and was walking to the bedroom when she experienced a dizzy spell.  She grabbed onto the door frame-but could not hold herself up and fell on her left hip.  She did not lose consciousness but felt faint.  She did experience some palpitations.  Her spouse did call EMS, when they arrived they noticed that the patient was in SVT.  She was given 6 mg of adenosine and spontaneously converted to sinus rhythm.  She was subsequently brought to the ED-where she went imaging to rule out any significant injury that was negative.  She has not stood or walked after this event.  Given the recurrent nature of these events over the past several months-the hospitalist service was asked to admit this patient for further evaluation and treatment.  Patient denies any fever, headache During this episode patient denied having any chest pain or shortness of breath There is no history of nausea, vomiting or diarrhea There is no history of hematuria, dysuria   Review of System: As mentioned in the history of present illness. All other systems reviewed and are negative. Past Medical History:  Diagnosis Date   Asthma    as child   GERD (gastroesophageal reflux disease)    High blood pressure    Iritis    Reflux    SOB (shortness of breath)    Past Surgical History:  Procedure  Laterality Date   LYMPH NODE BIOPSY Right 07/29/2014   Procedure: RIGHT INGUINAL LYMPH NODE BIOPSY;  Surgeon: [name], MD;  Location: [facility];  Service: General;  Laterality: Right;   NO PAST SURGERIES     SKIN BIOPSY Right 07/29/2014   Procedure: SKIN PUNCH BIOPSY;  Surgeon: [name], MD;  Location: [facility];  Service: General;  Laterality: Right;   Social History:  reports that she has never smoked. She has never used smokeless tobacco. She reports that she does not drink alcohol and does not use drugs.  Allergies  Allergen Reactions   Sulfa Antibiotics     Family History  Problem Relation Age of Onset   Congestive Heart Failure Father    Congestive Heart Failure Brother    Congestive Heart Failure Paternal Grandmother     Prior to Admission medications   Medication Sig Start Date End Date Taking? Authorizing Provider  acetaminophen (TYLENOL) 325 MG tablet Take 650 mg by mouth daily as needed for mild pain.   Yes [provider]  carvedilol (COREG) 3.125 MG tablet Take 1 tablet (3.125 mg total) by mouth 2 (two) times daily with a meal. 03/16/18  Yes Barnetta Chapel, MD  Cholecalciferol (VITAMIN D3) 250 MCG (10000 UT) capsule Take 10,000 Units by mouth daily.   Yes [provider]  famciclovir (FAMVIR) 500 MG tablet TAKE 1 TABLET BY MOUTH EVERY DAY Patient taking differently: Take 250 mg by mouth daily. 03/29/21  Yes  Rankin, Alford Highland, MD  nitroGLYCERIN (NITROSTAT) 0.4 MG SL tablet Take one every five minutes under your tongue for up to 3 doses if you have onset of  shortness of breath. 08/25/21  Yes Harris, Abigail, PA-C  Polyethyl Glycol-Propyl Glycol (SYSTANE) 0.4-0.3 % SOLN Apply 1 drop to eye 4 (four) times daily.   Yes [provider]  prednisoLONE acetate (PRED FORTE) 1 % ophthalmic suspension 1 DROP IN BOTH EYES EVERY OTHER DAY Patient taking differently: Place 1 drop into both eyes See admin instructions. 1 DROP  IN BOTH EYES EVERY OTHER DAY 07/27/21  Yes Rankin, Alford Highland, MD  vitamin B-12 (CYANOCOBALAMIN) 1000 MCG tablet Take 1,000 mcg by mouth daily.   Yes [provider]  aspirin EC 81 MG tablet Take 1 tablet (81 mg total) by mouth daily. Swallow whole. Patient not taking: Reported on 08/24/2022 08/25/21   Arthor Captain, PA-C  ipratropium-albuterol (DUONEB) 0.5-2.5 (3) MG/3ML SOLN Take 3 mLs by nebulization every 6 (six) hours as needed. Patient not taking: Reported on 08/24/2022 03/16/18   Berton Mount I, MD    Physical Exam: Vitals:   08/24/22 1530 08/24/22 1600 08/24/22 1615 08/24/22 1637  BP: 133/60 (!) 161/67 (!) 153/72   Pulse: 67 64 72   Resp: (!) 21 20 19    Temp:    98.7 F (37.1 C)  TempSrc:    Oral  SpO2: 94% 96% 94%   Weight:      Height:       Gen Exam:Alert awake-not in any distress HEENT:atraumatic, normocephalic Chest: B/L clear to auscultation anteriorly CVS:S1S2 regular Abdomen:soft non tender, non distended Extremities:no edema Neurology: Non focal Skin: no rash  Data Reviewed:    Latest Ref Rng & Units 08/24/2022   12:09 PM 08/25/2021    3:05 PM 05/10/2019    1:09 PM  CBC  WBC 4.0 - 10.5 K/uL 6.7  10.2  5.8   Hemoglobin 12.0 - 15.0 g/dL 01.0  27.2  53.6   Hematocrit 36.0 - 46.0 % 39.6  38.6  39.5   Platelets 150 - 400 K/uL 157  180  184         Latest Ref Rng & Units 08/24/2022   12:09 PM 08/25/2021    3:05 PM 05/10/2019    1:09 PM  BMP  Glucose 70 - 99 mg/dL 644  034  742   BUN 8 - 23 mg/dL 15  17  14    Creatinine 0.44 - 1.00 mg/dL 5.95  6.38  7.56   Sodium 135 - 145 mmol/L 137  137  138   Potassium 3.5 - 5.1 mmol/L 4.7  4.4  4.2   Chloride 98 - 111 mmol/L 106  108  105   CO2 22 - 32 mmol/L 25  22  23    Calcium 8.9 - 10.3 mg/dL 8.8  9.1  9.5      Assessment and Plan: SVT with presyncopal episode resulted mechanical fall Sounds like this is secondary to symptomatic SVT Monitor in telemetry-increase beta-blocker dosage Obtain updated 2D  echocardiogram TSH stable PT/OT eval ED MD-Dr. 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 Observation status is judged to be reasonable and necessary in order to provide the required intensity of service to ensure the patient's safety. The patient's presenting symptoms, physical exam findings, and initial radiographic and laboratory data in the context of their medical condition is felt to place them at decreased risk for further clinical deterioration. Furthermore, it is anticipated that the patient will be medically stable for discharge from the hospital within 2 midnights of admission.   Author: Jeoffrey Massed, MD 08/24/2022 4:42 PM  For on call review [email or web address].

## 2022-08-24 NOTE — ED Notes (Signed)
LLE pain. No rotation, shortening, or other deformity noted. Distal CMS WNL.

## 2022-08-25 ENCOUNTER — Other Ambulatory Visit (HOSPITAL_COMMUNITY): Payer: Medicare Other

## 2022-08-25 ENCOUNTER — Observation Stay (HOSPITAL_COMMUNITY): Payer: Medicare Other

## 2022-08-25 DIAGNOSIS — W19XXXA Unspecified fall, initial encounter: Secondary | ICD-10-CM | POA: Diagnosis not present

## 2022-08-25 DIAGNOSIS — Z681 Body mass index (BMI) 19 or less, adult: Secondary | ICD-10-CM | POA: Diagnosis not present

## 2022-08-25 DIAGNOSIS — Y92009 Unspecified place in unspecified non-institutional (private) residence as the place of occurrence of the external cause: Secondary | ICD-10-CM | POA: Diagnosis not present

## 2022-08-25 DIAGNOSIS — K219 Gastro-esophageal reflux disease without esophagitis: Secondary | ICD-10-CM | POA: Diagnosis present

## 2022-08-25 DIAGNOSIS — Z8619 Personal history of other infectious and parasitic diseases: Secondary | ICD-10-CM | POA: Diagnosis not present

## 2022-08-25 DIAGNOSIS — Z882 Allergy status to sulfonamides status: Secondary | ICD-10-CM | POA: Diagnosis not present

## 2022-08-25 DIAGNOSIS — H3530 Unspecified macular degeneration: Secondary | ICD-10-CM | POA: Diagnosis present

## 2022-08-25 DIAGNOSIS — I10 Essential (primary) hypertension: Secondary | ICD-10-CM | POA: Diagnosis present

## 2022-08-25 DIAGNOSIS — R296 Repeated falls: Secondary | ICD-10-CM | POA: Diagnosis not present

## 2022-08-25 DIAGNOSIS — Z8249 Family history of ischemic heart disease and other diseases of the circulatory system: Secondary | ICD-10-CM | POA: Diagnosis not present

## 2022-08-25 DIAGNOSIS — I471 Supraventricular tachycardia, unspecified: Secondary | ICD-10-CM | POA: Diagnosis present

## 2022-08-25 DIAGNOSIS — Z79899 Other long term (current) drug therapy: Secondary | ICD-10-CM | POA: Diagnosis not present

## 2022-08-25 DIAGNOSIS — Z66 Do not resuscitate: Secondary | ICD-10-CM | POA: Diagnosis present

## 2022-08-25 DIAGNOSIS — R55 Syncope and collapse: Secondary | ICD-10-CM

## 2022-08-25 DIAGNOSIS — W1830XA Fall on same level, unspecified, initial encounter: Secondary | ICD-10-CM | POA: Diagnosis present

## 2022-08-25 DIAGNOSIS — I700 Atherosclerosis of aorta: Secondary | ICD-10-CM | POA: Diagnosis present

## 2022-08-25 DIAGNOSIS — R5381 Other malaise: Secondary | ICD-10-CM | POA: Diagnosis present

## 2022-08-25 DIAGNOSIS — M25552 Pain in left hip: Secondary | ICD-10-CM | POA: Diagnosis present

## 2022-08-25 DIAGNOSIS — R636 Underweight: Secondary | ICD-10-CM | POA: Diagnosis present

## 2022-08-25 DIAGNOSIS — Z9181 History of falling: Secondary | ICD-10-CM | POA: Diagnosis not present

## 2022-08-25 DIAGNOSIS — H35063 Retinal vasculitis, bilateral: Secondary | ICD-10-CM | POA: Diagnosis present

## 2022-08-25 DIAGNOSIS — R001 Bradycardia, unspecified: Secondary | ICD-10-CM | POA: Diagnosis present

## 2022-08-25 LAB — CBC
HCT: 34.2 % — ABNORMAL LOW (ref 36.0–46.0)
Hemoglobin: 11.4 g/dL — ABNORMAL LOW (ref 12.0–15.0)
MCH: 33 pg (ref 26.0–34.0)
MCHC: 33.3 g/dL (ref 30.0–36.0)
MCV: 99.1 fL (ref 80.0–100.0)
Platelets: 142 10*3/uL — ABNORMAL LOW (ref 150–400)
RBC: 3.45 MIL/uL — ABNORMAL LOW (ref 3.87–5.11)
RDW: 14.1 % (ref 11.5–15.5)
WBC: 6.1 10*3/uL (ref 4.0–10.5)
nRBC: 0 % (ref 0.0–0.2)

## 2022-08-25 LAB — ECHOCARDIOGRAM COMPLETE
AR max vel: 2.29 cm2
AV Peak grad: 6.7 mmHg
Ao pk vel: 1.29 m/s
Area-P 1/2: 3.6 cm2
Height: 64.016 in
S' Lateral: 2.7 cm
Weight: 1788.37 oz

## 2022-08-25 LAB — BASIC METABOLIC PANEL
Anion gap: 8 (ref 5–15)
BUN: 15 mg/dL (ref 8–23)
CO2: 22 mmol/L (ref 22–32)
Calcium: 8.8 mg/dL — ABNORMAL LOW (ref 8.9–10.3)
Chloride: 106 mmol/L (ref 98–111)
Creatinine, Ser: 1.04 mg/dL — ABNORMAL HIGH (ref 0.44–1.00)
GFR, Estimated: 50 mL/min — ABNORMAL LOW (ref >60)
Glucose, Bld: 117 mg/dL — ABNORMAL HIGH (ref 70–99)
Potassium: 3.8 mmol/L (ref 3.5–5.1)
Sodium: 136 mmol/L (ref 135–145)

## 2022-08-25 MED ORDER — AMIODARONE HCL 200 MG PO TABS
400.0000 mg | ORAL_TABLET | Freq: Two times a day (BID) | ORAL | Status: DC
  Administered 2022-08-25 – 2022-08-26 (×3): 400 mg via ORAL
  Filled 2022-08-25 (×3): qty 2

## 2022-08-25 MED ORDER — AMIODARONE HCL 200 MG PO TABS: 200.0000 mg | ORAL_TABLET | Freq: Every day | ORAL | Status: DC

## 2022-08-25 MED ORDER — VITAMIN D 25 MCG (1000 UNIT) PO TABS
5000.0000 [IU] | ORAL_TABLET | Freq: Every day | ORAL | Status: DC
  Administered 2022-08-25 – 2022-08-26 (×2): 5000 [IU] via ORAL
  Filled 2022-08-25 (×2): qty 5

## 2022-08-25 MED ORDER — ENOXAPARIN SODIUM 30 MG/0.3ML IJ SOSY
30.0000 mg | PREFILLED_SYRINGE | INTRAMUSCULAR | Status: DC
  Administered 2022-08-25: 30 mg via SUBCUTANEOUS
  Filled 2022-08-25: qty 0.3

## 2022-08-25 NOTE — TOC CM/SW Note (Signed)
Transition of Care [facility]) - Inpatient Brief Assessment   Patient Details  Name: Anne Bradshaw MRN: [id number] Date of Birth: Feb 14, 1928  Transition of Care [facility]) CM/SW Contact:    Gordy Clement, RN Phone Number: 08/25/2022, 10:22 AM   Clinical Narrative:  CM completed assessment.  Patient is from home with Husband  Patient states they are both independent .  Daughter lives just a few miles away. Other Daughter lives long distance but they are both available for assistance if needed . Patient states that the Cardiologist recommended a rollator so if she had another dizzy spell, she would have somewhere to sit and won't fall. CM will order .  No additional  TOC needs anticipated     Transition of Care Asessment: Insurance and Status: Insurance coverage has been reviewed (Medicare A and B) Patient has primary care physician: Yes Clovis Riley, L.August Saucer, MD) Home environment has been reviewed: From home with Husband- Daughter 5 min away Prior level of function:: independent Prior/Current Home Services: No current home services Social Determinants of Health Reivew: SDOH reviewed no interventions necessary Readmission risk has been reviewed: Yes (N/A listed) Transition of care needs: transition of care needs identified, TOC will continue to follow (Rollator needs to be ordered)

## 2022-08-25 NOTE — Progress Notes (Signed)
Echocardiogram 2D Echocardiogram has been performed.  Anne Bradshaw 08/25/2022, 2:51 PM

## 2022-08-25 NOTE — Consult Note (Signed)
Electrophysiology consultation   Patient ID: Anne Bradshaw MRN: [id number]; DOB: 1927/09/27  Admit date: 08/24/2022 Date of Consult: 08/25/2022  PCP:  Clovis Riley, L.August Saucer, MD   [facility] Providers Cardiologist:  Verne Carrow, MD   {  History of Present Illness:   Ms. Anne Bradshaw is a very pleasant 89 year old woman who I am seeing today for an evaluation of SVT at the request of Dr. Jens Som.  She has a history of GERD, hypertension.  She lives at home with her husband who is 67 years old.  She tells me that she has had multiple episodes of dizziness.  They have been occurring multiple times per week.  No clear triggers.  Typically she feels the episode come on and she is able to hold onto a doorway or pause for a few moments until the symptoms pass.  They typically last for seconds at a time.  She has tried Valsalva maneuvers in the past with variable success.  During her episode yesterday, she fell and struck her head and hip.  When EMS arrived she was in supraventricular tachycardia with heart rates initially in the 170s.  With administration of 6 mg of IV adenosine, the heart rate returned to sinus in the 80s.  No fevers or chills.  No recent illnesses.  No episodes of complete loss of consciousness.  No new medications.   Past medical, social, family, surgical history is reviewed.    ROS:  Please see the history of present illness.  All other ROS reviewed and negative.     Physical Exam/Data:   Vitals:   08/24/22 2143 08/25/22 0011 08/25/22 0423 08/25/22 0751  BP: 137/62 132/63 (!) 116/55 139/65  Pulse: 71 65 65 65  Resp:  20 20 19   Temp:  97.9 F (36.6 C) 97.7 F (36.5 C) 98.3 F (36.8 C)  TempSrc:  Oral Oral Oral  SpO2:  95% 95% 93%  Weight:      Height:        Intake/Output Summary (Last 24 hours) at 08/25/2022 0954 Last data filed at 08/25/2022 0911 Gross per 24 hour  Intake 361 ml  Output 0 ml  Net 361 ml      08/24/2022    6:17 PM 08/24/2022    11:58 AM 09/16/2021    1:52 PM  Last 3 Weights  Weight (lbs) 111 lb 12.4 oz 109 lb 119 lb 12.8 oz  Weight (kg) 50.7 kg 49.442 kg 54.341 kg     Body mass index is 19.18 kg/m.   General:  Elderly, thin. In no acute distress Cardiac:  normal S1, S2; RRR; no murmur  Lungs:  clear to auscultation bilaterally, no wheezing, rhonchi or rales  Psych:  Normal affect   EKG:  The EKG was personally reviewed and demonstrates:  sinusu rhythm. No preexcitation.  Telemetry:  Telemetry was personally reviewed and demonstrates:  sinus.  Relevant CV Studies:  Echo pending  October 01, 2021 echo EF 60 RV normal Trivial MR Trivial AI Moderate aortic plaque in the descending aorta  Assessment and Plan:   Ms. Anne Bradshaw is a 89 year old woman who presents to the hospital with recurrent lightheadedness and dizziness spells.  During the most recent episode EMS was called and found her in a narrow complex regular tachycardia with a heart rate of around 130 bpm.  Her SVT resolved with a dose of adenosine and she had resolution of symptoms.  I suspect her recurrent dizziness spells are secondary to SVT.  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  I discussed treatment options with the patient.  Given her age, I would like to start with medical therapy.  I am going to add amiodarone 400 mg by mouth twice daily for 5 days followed by 200 mg by mouth daily thereafter.  I would like to watch her on telemetry for an additional 24 to 48 hours to make sure she is stable on this dose. Stop Coreg for now to minimize risk of bradycardia. No indication for permanent pacing at this time.    We discussed ongoing heart rhythm monitoring to help further investigate her recurrent falls.  I discussed using a loop recorder for monitoring her heart rhythm after discharge.  She is interested in  proceeding.  I did discuss the loop recorder implant procedure in detail including the risks and associated monthly monitoring cost.  Will plan to do this tomorrow.   For questions or updates, please contact [facility] Please consult [email or web address] for contact info under    Signed, Lanier Prude, MD  08/25/2022 9:54 AM

## 2022-08-25 NOTE — Evaluation (Signed)
Physical Therapy Evaluation Patient Details Name: Anne Bradshaw MRN: [id number] DOB: [birth year] Today's Date: 08/25/2022  History of Present Illness  Pt is 89 yo female after fall with presyncopal episode on 08/24/22. Pt found to be in SVT. Pt with hx including HTN, GERD, and macular degeneration.  Clinical Impression  Pt admitted with above diagnosis. At baseline, pt is independent.  She does report uses cane in community and occasionally holds furniture/walls at home.  She denies falls at home other than admitting fall associated with pre-syncope.  Pt has home support.  Today, pt able to ambulate 300' with rollator and supervision. Her orthostatic BP were negative and hx not consistent with vestibular dizziness.  Did screen vestibular as pt hit posterior head with fall and was negative.  Cardiology recommended rollator for pt to sit with episodes of dizziness that seem to be coming from SVT.  Worked with pt with rollator use and gait/transfer training with taking rest breaks on rollator.  Required min cues for rollator - will benefit from continued reinforcement of rollator use while hospitalized, but likely no therapy needs at d/c.  Pt currently with functional limitations due to the deficits listed below (see PT Problem List). Pt will benefit from acute skilled PT to increase their independence and safety with mobility to allow discharge.           Assistance Recommended at Discharge PRN  If plan is discharge home, recommend the following:  Can travel by private vehicle  Help with stairs or ramp for entrance        Equipment Recommendations Rollator (4 wheels)  Recommendations for Other Services       Functional Status Assessment Patient has not had a recent decline in their functional status     Precautions / Restrictions Precautions Precautions: Fall Restrictions Weight Bearing Restrictions: No      Mobility  Bed Mobility Overal bed mobility: Needs Assistance Bed  Mobility: Supine to Sit, Sit to Supine     Supine to sit: Supervision Sit to supine: Supervision        Transfers Overall transfer level: Needs assistance Equipment used: Rollator (4 wheels) Transfers: Sit to/from Stand Sit to Stand: Min guard           General transfer comment: Sit to stand from bed x 1, from toielt x 1, and from rollator x2.  Mod cues for rollator 1 attempt and min cues 2nd attempt    Ambulation/Gait Ambulation/Gait assistance: Supervision Gait Distance (Feet): 300 Feet Assistive device: Rollator (4 wheels) Gait Pattern/deviations: WFL(Within Functional Limits) Gait velocity: mild decrease but functional     General Gait Details: Pt with good balance and normal gait pattern; She tolerated well but had her take seated rest break x 2 for practice "parking" and resting on rollator  Stairs            Wheelchair Mobility     Tilt Bed    Modified Rankin (Stroke Patients Only)       Balance Overall balance assessment: Needs assistance Sitting-balance support: No upper extremity supported Sitting balance-Leahy Scale: Good     Standing balance support: No upper extremity supported Standing balance-Leahy Scale: Fair Standing balance comment: Could static stand and do ADLs without UE support; rollator to ambulate                             Pertinent Vitals/Pain Pain Assessment Pain Assessment: No/denies pain    Home  Living Family/patient expects to be discharged to:: Private residence Living Arrangements: Spouse/significant other Available Help at Discharge: Available 24 hours/day Type of Home: House Home Access: Stairs to enter Entrance Stairs-Rails: Right Entrance Stairs-Number of Steps: 2   Home Layout: One level Home Equipment: Shower seat - built Charity fundraiser (2 wheels);Grab bars - tub/shower;Shower seat Additional Comments: 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    Prior Function  Prior Level of Function : Independent/Modified Independent             Mobility Comments: Reports could ambulate in community; at home held walls if needed ; in community used cane; denies falls other than one at admission associated with pre-syncope ADLs Comments: independent adls and iadls     Hand Dominance   Dominant Hand: Right    Extremity/Trunk Assessment   Upper Extremity Assessment Upper Extremity Assessment: Overall WFL for tasks assessed    Lower Extremity Assessment Lower Extremity Assessment: Overall WFL for tasks assessed    Cervical / Trunk Assessment Cervical / Trunk Assessment: Normal  Communication   Communication: No difficulties  Cognition Arousal/Alertness: Awake/alert Behavior During Therapy: WFL for tasks assessed/performed Overall Cognitive Status: Within Functional Limits for tasks assessed                                 General Comments: very kind and motivated        General Comments General comments (skin integrity, edema, etc.): Received message that cardiology recommended rollator to sit during dizzy spells.  Pt educated on rollator safety and use for rest breaks.  In regards to her pre-syncope/dizziness, pt had no dizziness during session, orthostatic vitals were normal (consistently 160's/80's), and denies any other dizziness.  Pt had hit back of her head when she fell -screened for vestibular and she had no dizziness with rolling, head turns, or with return to supine.    Exercises     Assessment/Plan    PT Assessment Patient needs continued PT services  PT Problem List Decreased knowledge of use of DME;Decreased mobility;Decreased activity tolerance       PT Treatment Interventions DME instruction;Therapeutic exercise;Gait training;Balance training;Stair training;Functional mobility training;Therapeutic activities;Patient/family education    PT Goals (Current goals can be found in the Care Plan section)  Acute Rehab  PT Goals Patient Stated Goal: return home PT Goal Formulation: With patient Time For Goal Achievement: 09/08/22 Potential to Achieve Goals: Good    Frequency Min 3X/week     Co-evaluation               AM-PAC PT "6 Clicks" Mobility  Outcome Measure Help needed turning from your back to your side while in a flat bed without using bedrails?: None Help needed moving from lying on your back to sitting on the side of a flat bed without using bedrails?: None Help needed moving to and from a bed to a chair (including a wheelchair)?: A Little Help needed standing up from a chair using your arms (e.g., wheelchair or bedside chair)?: A Little Help needed to walk in hospital room?: A Little Help needed climbing 3-5 steps with a railing? : A Little 6 Click Score: 20    End of Session Equipment Utilized During Treatment: Gait belt Activity Tolerance: Patient tolerated treatment well Patient left: in bed;with call bell/phone within reach;with bed alarm set Nurse Communication: Mobility status PT Visit Diagnosis: Other abnormalities of gait and  mobility (R26.89)    Time: 1610-9604 PT Time Calculation (min) (ACUTE ONLY): 29 min   Charges:   PT Evaluation $PT Eval Low Complexity: 1 Low PT Treatments $Gait Training: 8-22 mins PT General Charges $$ ACUTE PT VISIT: 1 Visit         Anise Salvo, PT Acute Rehab [facility] [phone]   Rayetta Humphrey 08/25/2022, 11:43 AM

## 2022-08-25 NOTE — TOC Progression Note (Signed)
Transition of Care [facility]) - Progression Note    Patient Details  Name: Anne Bradshaw MRN: [id number] Date of Birth: 1927/10/24  [facility]) CM/SW Contact  Gordy Clement, RN Phone Number: 08/25/2022, 11:35 AM  Clinical Narrative:     Rollator ordered  [employer] to deliver to bedside [facility] will continue to follow patient for any additional discharge needs           Expected Discharge Plan and Services                                               Social Determinants of Health (SDOH) Interventions SDOH Screenings   Food Insecurity: No Food Insecurity (08/24/2022)  Housing: Low Risk  (08/24/2022)  Transportation Needs: No Transportation Needs (08/24/2022)  Utilities: Not At Risk (08/24/2022)  Tobacco Use: Low Risk  (08/24/2022)    Readmission Risk Interventions     No data to display

## 2022-08-25 NOTE — Progress Notes (Signed)
PROGRESS NOTE        PATIENT DETAILS Name: Anne Bradshaw Age: 89 y.o. Sex: female Date of Birth: 12/02/1927 Admit Date: 08/24/2022 Admitting Physician Dewayne Shorter Levora Dredge, MD ZOX:[id number], L.August Saucer, MD  Brief Summary: Patient is a 89 y.o.  female with history of HTN-who presented with presyncopal episode/fall-found to have SVT.  Significant events: 7/3>> admit to [facility]  Significant studies: 7/3>> CXR: No PNA 7/3>> x-ray left pelvis: No fracture 7/3>> CT head: No acute intracranial abnormality 7/3>> CT C-spine: No fracture.  Significant microbiology data: None  Procedures: None  Consults: Cardiology  Subjective: Lying comfortably in bed-denies any chest pain or shortness of breath.  Objective: Vitals: Blood pressure 139/65, pulse 65, temperature 98.3 F (36.8 C), temperature source Oral, resp. rate 19, height 5' 4.02" (1.626 m), weight 50.7 kg, SpO2 93 %.   Exam: Gen Exam:Alert awake-not in any distress HEENT:atraumatic, normocephalic Chest: B/L clear to auscultation anteriorly CVS:S1S2 regular Abdomen:soft non tender, non distended Extremities:no edema Neurology: Non focal Skin: no rash  Pertinent Labs/Radiology:    Latest Ref Rng & Units 08/25/2022    1:39 AM 08/24/2022    8:52 PM 08/24/2022   12:09 PM  CBC  WBC 4.0 - 10.5 K/uL 6.1  6.7  6.7   Hemoglobin 12.0 - 15.0 g/dL 98.1  19.1  47.8   Hematocrit 36.0 - 46.0 % 34.2  36.4  39.6   Platelets 150 - 400 K/uL 142  144  157     Lab Results  Component Value Date   NA 136 08/25/2022   K 3.8 08/25/2022   CL 106 08/25/2022   CO2 22 08/25/2022      Assessment/Plan: Presyncope/lightheadedness/dizzy spells resulting in a mechanical fall due to SVT Sinus rhythm Seen by EP-Coreg to be stopped due to concern for bradycardia Seen by EP today-started on amiodarone today-recommendations to watch inpatient for another 24 to 48 hours before consideration of discharge given advanced age/risk of  falls. Loop recorder planned for tomorrow  HTN BP stable Will have to watch blood pressure closely-as Coreg stopped-May need an agent like amlodipine-that will not put her at risk for bradycardia.  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   Weight as of this encounter: 50.7 kg.   Code status:   Code Status: DNR   DVT Prophylaxis: enoxaparin (LOVENOX) injection 40 mg Start: 08/24/22 2200   Family Communication: None at bedside   Disposition Plan: Status is: Observation The patient will require care spanning > 2 midnights and should be moved to inpatient because: Severity of illness-Loop recorder planned for tomorrow.   Planned Discharge Destination:Home health   Diet: Diet Order             Diet Heart Room service appropriate? Yes; Fluid consistency: Thin  Diet effective now                     Antimicrobial agents: Anti-infectives (From admission, onward)    Start     Dose/Rate Route Frequency Ordered Stop   08/25/22 1000  valACYclovir (VALTREX) tablet 1,000 mg  1,000 mg Oral Daily 08/24/22 1636          MEDICATIONS: Scheduled Meds:  amiodarone  400 mg Oral BID   Followed by   Melene Muller ON 08/30/2022] amiodarone  200 mg Oral Daily   aspirin EC  81 mg Oral Daily   cyanocobalamin  1,000 mcg Oral Daily   enoxaparin (LOVENOX) injection  40 mg Subcutaneous Q24H   polyvinyl alcohol  1 drop Both Eyes TID AC & HS   prednisoLONE acetate  1 drop Both Eyes QODAY   sodium chloride flush  3 mL Intravenous Q12H   valACYclovir  1,000 mg Oral Daily   Vitamin D3  10,000 Units Oral Daily   Continuous Infusions:  sodium chloride     PRN Meds:.sodium chloride, acetaminophen  **OR** acetaminophen, hydrALAZINE, ipratropium-albuterol, nitroGLYCERIN, ondansetron **OR** ondansetron (ZOFRAN) IV, polyethylene glycol, sodium chloride flush   I have personally reviewed following labs and imaging studies  LABORATORY DATA: CBC: Recent Labs  Lab 08/24/22 1209 08/24/22 2052 08/25/22 0139  WBC 6.7 6.7 6.1  NEUTROABS 4.8  --   --   HGB 13.0 12.1 11.4*  HCT 39.6 36.4 34.2*  MCV 99.7 97.6 99.1  PLT 157 144* 142*    Basic Metabolic Panel: Recent Labs  Lab 08/24/22 1209 08/24/22 2052 08/25/22 0139  NA 137  --  136  K 4.7  --  3.8  CL 106  --  106  CO2 25  --  22  GLUCOSE 128*  --  117*  BUN 15  --  15  CREATININE 0.97 0.99 1.04*  CALCIUM 8.8*  --  8.8*  MG 2.1  --   --     GFR: Estimated Creatinine Clearance: 27.6 mL/min (A) (by C-G formula based on SCr of 1.04 mg/dL (H)).  Liver Function Tests: Recent Labs  Lab 08/24/22 1209  AST 25  ALT 20  ALKPHOS 56  BILITOT 0.6  PROT 6.3*  ALBUMIN 3.1*   No results for input(s): "LIPASE", "AMYLASE" in the last 168 hours. No results for input(s): "AMMONIA" in the last 168 hours.  Coagulation Profile: No results for input(s): "INR", "PROTIME" in the last 168 hours.  Cardiac Enzymes: No results for input(s): "CKTOTAL", "CKMB", "CKMBINDEX", "TROPONINI" in the last 168 hours.  BNP (last 3 results) No results for input(s): "PROBNP" in the last 8760 hours.  Lipid Profile: No results for input(s): "CHOL", "HDL", "LDLCALC", "TRIG", "CHOLHDL", "LDLDIRECT" in the last 72 hours.  Thyroid Function Tests: Recent Labs    08/24/22 1209  TSH 3.111    Anemia Panel: No results for input(s): "VITAMINB12", "FOLATE", "FERRITIN", "TIBC", "IRON", "RETICCTPCT" in the last 72 hours.  Urine analysis:    Component Value Date/Time   COLORURINE YELLOW 08/25/2021 1642   APPEARANCEUR CLEAR 08/25/2021 1642   LABSPEC 1.013 08/25/2021 1642   PHURINE 7.0 08/25/2021 1642   GLUCOSEU NEGATIVE 08/25/2021 1642   HGBUR  NEGATIVE 08/25/2021 1642   BILIRUBINUR NEGATIVE 08/25/2021 1642   KETONESUR NEGATIVE 08/25/2021 1642   PROTEINUR NEGATIVE 08/25/2021 1642   NITRITE NEGATIVE 08/25/2021 1642   LEUKOCYTESUR NEGATIVE 08/25/2021 1642    Sepsis Labs: Lactic Acid, Venous No results found for: "LATICACIDVEN"  MICROBIOLOGY: No results found for this or any previous visit (from the past 240 hour(s)).  RADIOLOGY STUDIES/RESULTS: CT Head Wo Contrast  Result Date: 08/24/2022 CLINICAL DATA:  Provided history: Head trauma, minor. Neck trauma. Additional history provided: Fall. Dizziness. EXAM: CT HEAD WITHOUT CONTRAST CT CERVICAL SPINE WITHOUT CONTRAST TECHNIQUE: Multidetector CT imaging of  the head and cervical spine was performed following the standard protocol without intravenous contrast. Multiplanar CT image reconstructions of the cervical spine were also generated. RADIATION DOSE REDUCTION: This exam was performed according to the departmental dose-optimization program which includes automated exposure control, adjustment of the mA and/or kV according to patient size and/or use of iterative reconstruction technique. COMPARISON:  Head CT 03/15/2021.  Cervical spine CT 03/11/2018. FINDINGS: CT HEAD FINDINGS Brain: Generalized cerebral atrophy. Redemonstrated chronic lacunar infarct within the left corona radiata/basal ganglia. Background mild patchy and ill-defined hypoattenuation within the cerebral white matter, nonspecific but compatible with chronic small vessel ischemic disease. Small chronic infarct within the left cerebellar hemisphere, new from the prior head CT of 03/15/2021. There is no acute intracranial hemorrhage. No demarcated cortical infarct. No extra-axial fluid collection. No evidence of an intracranial mass. No midline shift. Vascular: No hyperdense vessel. Atherosclerotic calcifications. Skull: No calvarial fracture or aggressive osseous lesion. Unchanged chronic thinned appearance of the bilateral  parietal calvarium. Sinuses/Orbits: No mass or acute finding within the imaged orbits. No significant paranasal sinus disease. CT CERVICAL SPINE FINDINGS Alignment: Mild C5-C6 grade 1 retrolisthesis. Skull base and vertebrae: The basion-dental and atlanto-dental intervals are maintained.No evidence of acute fracture to the cervical spine. 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 Soft tissues and spinal canal: No prevertebral fluid or swelling. No visible canal hematoma. Disc levels: Cervical spondylosis with multilevel disc space narrowing, disc bulges/central disc protrusions, posterior disc osteophyte complexes and uncovertebral hypertrophy. Disc space narrowing is greatest at C4-C5 and C5-C6 (moderate-to-advanced at these levels). Multilevel spinal canal narrowing. Most notably at C3-C4, a central disc protrusion contributes to apparent mild/moderate spinal canal stenosis. Bony neural foraminal narrowing on the right at C4-C5 and bilaterally at C5-C6. Upper chest: No consolidation within the imaged lung apices. No visible pneumothorax. Biapical pleuroparenchymal scarring. IMPRESSION: CT head: 1.  No evidence of an acute intracranial abnormality. 2. Parenchymal atrophy, chronic small vessel disease and chronic infarcts as described. CT cervical spine: 1. No evidence of an acute cervical spine fracture. 2. Mild C5-C6 grade 1 retrolisthesis. 3. Cervical spondylosis as described. 4. 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 Electronically Signed   By: Jackey Loge D.O.   On: 08/24/2022 15:17   CT Cervical Spine Wo Contrast  Result Date: 08/24/2022 CLINICAL DATA:  Provided history: Head trauma, minor. Neck trauma. Additional history provided: Fall. Dizziness. EXAM: CT HEAD WITHOUT CONTRAST CT CERVICAL SPINE WITHOUT CONTRAST TECHNIQUE: Multidetector CT imaging of the head and cervical spine was performed following the standard protocol without intravenous contrast. Multiplanar CT image reconstructions of the cervical  spine were also generated. RADIATION DOSE REDUCTION: This exam was performed according to the departmental dose-optimization program which includes automated exposure control, adjustment of the mA and/or kV according to patient size and/or use of iterative reconstruction technique. COMPARISON:  Head CT 03/15/2021.  Cervical spine CT 03/11/2018. FINDINGS: CT HEAD FINDINGS Brain: Generalized cerebral atrophy. Redemonstrated chronic lacunar infarct within the left corona radiata/basal ganglia. Background mild patchy and ill-defined hypoattenuation within the cerebral white matter, nonspecific but compatible with chronic small vessel ischemic disease. Small chronic infarct within the left cerebellar hemisphere, new from the prior head CT of 03/15/2021. There is no acute intracranial hemorrhage. No demarcated cortical infarct. No extra-axial fluid collection. No evidence of an intracranial mass. No midline shift. Vascular: No hyperdense vessel. Atherosclerotic calcifications. Skull: No calvarial fracture or aggressive osseous lesion. Unchanged chronic thinned appearance of the bilateral parietal calvarium. Sinuses/Orbits: No mass or acute  finding within the imaged orbits. No significant paranasal sinus disease. CT CERVICAL SPINE FINDINGS Alignment: Mild C5-C6 grade 1 retrolisthesis. Skull base and vertebrae: The basion-dental and atlanto-dental intervals are maintained.No evidence of acute fracture to the cervical spine. 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 Soft tissues and spinal canal: No prevertebral fluid or swelling. No visible canal hematoma. Disc levels: Cervical spondylosis with multilevel disc space narrowing, disc bulges/central disc protrusions, posterior disc osteophyte complexes and uncovertebral hypertrophy. Disc space narrowing is greatest at C4-C5 and C5-C6 (moderate-to-advanced at these levels). Multilevel spinal canal narrowing. Most notably at C3-C4, a central disc protrusion contributes to apparent  mild/moderate spinal canal stenosis. Bony neural foraminal narrowing on the right at C4-C5 and bilaterally at C5-C6. Upper chest: No consolidation within the imaged lung apices. No visible pneumothorax. Biapical pleuroparenchymal scarring. IMPRESSION: CT head: 1.  No evidence of an acute intracranial abnormality. 2. Parenchymal atrophy, chronic small vessel disease and chronic infarcts as described. CT cervical spine: 1. No evidence of an acute cervical spine fracture. 2. Mild C5-C6 grade 1 retrolisthesis. 3. Cervical spondylosis as described. 4. 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 Electronically Signed   By: Jackey Loge D.O.   On: 08/24/2022 15:17   DG Chest Port 1 View  Result Date: 08/24/2022 CLINICAL DATA:  Intermittent dizziness for 2-3 months.  Fall. EXAM: PORTABLE CHEST 1 VIEW COMPARISON:  Chest radiograph 08/25/2021. FINDINGS: The cardiomediastinal silhouette is normal There is no focal consolidation or pulmonary edema. There is no pleural effusion or pneumothorax. Biapical pleural scarring is unchanged. There is no acute osseous abnormality. IMPRESSION: Stable chest with no radiographic evidence of acute cardiopulmonary process. Electronically Signed   By: Lesia Hausen M.D.   On: 08/24/2022 13:02   DG Hip Unilat W or Wo Pelvis 2-3 Views Left  Result Date: 08/24/2022 CLINICAL DATA:  Fall with pain to the left lower extremity EXAM: DG HIP (WITH OR WITHOUT PELVIS) 3V LEFT COMPARISON:  None Available. FINDINGS: There is no evidence of hip fracture or dislocation. There is no evidence of arthropathy or other focal bone abnormality. Capsular radiodensity projects over the left hemiabdomen, likely ingested within bowel. Surgical clips project over the right hip. IMPRESSION: No acute fracture or dislocation. Electronically Signed   By: Agustin Cree M.D.   On: 08/24/2022 13:00     LOS: 0 days   Jeoffrey Massed, MD  [facility]    To contact the attending provider between 7A-7P or the covering  provider during after hours 7P-7A, please log into the web site [email or web address] and access using universal [place] password for that web site. If you do not have the password, please call the hospital operator.  08/25/2022, 10:09 AM

## 2022-08-25 NOTE — Evaluation (Signed)
Occupational Therapy Evaluation Patient Details Name: Anne Bradshaw MRN: [id number] DOB: April 06, 1927 Today's Date: 08/25/2022   History of Present Illness Pt is 89 yo female after fall with presyncopal episode on 08/24/22. Pt found to be in SVT. Pt with hx including HTN, GERD, and macular degeneration.   Clinical Impression   Pt currently at min guard assist for grooming tasks in standing with min assist for transfers without use of an assistive device.  Pt without dizziness during session.  She reports being modified independent with ADLs at home and having assist from her spouse with meals and homemanagement in addition to eating out.  Feel she will benefit from acute care OT to address these deficits and increase ADL performance back to modified independent level.  Anticipate no post acute OT needs at this time.      Recommendations for follow up therapy are one component of a multi-disciplinary discharge planning process, led by the attending physician.  Recommendations may be updated based on patient status, additional functional criteria and insurance authorization.   Assistance Recommended at Discharge PRN  Patient can return home with the following Assistance with cooking/housework;Help with stairs or ramp for entrance    Functional Status Assessment  Patient has had a recent decline in their functional status and demonstrates the ability to make significant improvements in function in a reasonable and predictable amount of time.  Equipment Recommendations  None recommended by OT       Precautions / Restrictions Precautions Precautions: Fall Restrictions Weight Bearing Restrictions: No      Mobility Bed Mobility Overal bed mobility: Needs Assistance Bed Mobility: Supine to Sit, Sit to Supine     Supine to sit: Modified independent (Device/Increase time) Sit to supine: Modified independent (Device/Increase time)        Transfers Overall transfer level: Needs  assistance Equipment used: None Transfers: Sit to/from Stand, Bed to chair/wheelchair/BSC Sit to Stand: Min guard     Step pivot transfers: Min assist     General transfer comment: Min hand held assist from bed to the sink for combing her hair.      Balance Overall balance assessment: Needs assistance Sitting-balance support: No upper extremity supported Sitting balance-Leahy Scale: Good     Standing balance support: No upper extremity supported Standing balance-Leahy Scale: Fair Standing balance comment: Needs UE support for mobility                           ADL either performed or assessed with clinical judgement   ADL Overall ADL's : Needs assistance/impaired Eating/Feeding: Independent   Grooming: Brushing hair;Wash/dry hands;Min guard;Standing   Upper Body Bathing: Set up Upper Body Bathing Details (indicate cue type and reason): simulated Lower Body Bathing: Min guard;Sit to/from stand Lower Body Bathing Details (indicate cue type and reason): simulated Upper Body Dressing : Set up;Sitting Upper Body Dressing Details (indicate cue type and reason): simulated Lower Body Dressing: Min guard;Sit to/from stand Lower Body Dressing Details (indicate cue type and reason): simulated Toilet Transfer: Min guard;Ambulation Toilet Transfer Details (indicate cue type and reason): hand held assist Toileting- Clothing Manipulation and Hygiene: Min guard;Sit to/from stand       Functional mobility during ADLs: Minimal assistance (hand held assist) General ADL Comments: Pt reports having a shower seat at home if needed and is planning to get a rollator from [place] to assist with mobility.  Spouse provides 24 hour assist as needed.  Recommend hand held shower,  but pt reports her spouse doesn't like them.     Vision Baseline Vision/History: 1 Wears glasses Ability to See in Adequate Light: 0 Adequate Patient Visual Report: No change from baseline Vision Assessment?: No  apparent visual deficits     Perception  Not tested   Praxis  Not tested    Pertinent Vitals/Pain Pain Assessment Pain Assessment: No/denies pain     Hand Dominance Right   Extremity/Trunk Assessment Upper Extremity Assessment Upper Extremity Assessment: Overall WFL for tasks assessed   Lower Extremity Assessment Lower Extremity Assessment: Defer to PT evaluation   Cervical / Trunk Assessment Cervical / Trunk Assessment: Normal   Communication Communication Communication: No difficulties   Cognition Arousal/Alertness: Awake/alert Behavior During Therapy: WFL for tasks assessed/performed Overall Cognitive Status: Within Functional Limits for tasks assessed                                       General Comments  Received message that cardiology recommended rollator to sit during dizzy spells.  Pt educated on rollator safety and use for rest breaks.  In regards to her pre-syncope/dizziness, pt had no dizziness during session, orthostatic vitals were normal (consistently 160's/80's), and denies any other dizziness.  Pt had hit back of her head when she fell -screened for vestibular and she had no dizziness with rolling, head turns, or with return to supine.            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      Prior Functioning/Environment Prior Level of Function : Independent/Modified Independent              Mobility Comments: Reports could ambulate in community; at home held walls if needed ; in community used cane; denies falls other than one at admission associated with pre-syncope ADLs Comments: independent adls and iadls        OT Problem List: Impaired balance (sitting and/or standing)      OT Treatment/Interventions: Self-care/ADL training;Patient/family education;Balance training;Neuromuscular education;Therapeutic activities;DME and/or AE instruction    OT Goals(Current goals can be found in the care plan section) Acute Rehab OT Goals Patient Stated Goal: Pt wants to go home tomorrow and stop having these "dizzy spells". OT Goal Formulation: With patient Time For Goal Achievement: 09/08/22 Potential to Achieve Goals: Good  OT Frequency: Min 1X/week       AM-PAC OT "6 Clicks" Daily Activity     Outcome Measure Help from another person eating meals?: None Help from another person taking care of personal grooming?: A Little Help from another person toileting, which includes using toliet, bedpan, or urinal?: A Little Help from another person bathing (including washing, rinsing, drying)?: A Little Help from another person to put on and taking off regular upper body clothing?: None Help from another person to put on and taking off regular lower body clothing?: A Little 6 Click Score: 20   End of Session Equipment  Utilized During Treatment: Gait belt Nurse Communication: Mobility status  Activity Tolerance: Patient tolerated treatment well Patient left: in bed  OT Visit Diagnosis: Unsteadiness on feet (R26.81)                Time: 1132-1200 OT Time Calculation (min): 28 min Charges:  OT General Charges $OT Visit: 1 Visit OT Evaluation $OT Eval Moderate Complexity: 1 Mod OT Treatments $Self Care/Home Management : 8-22 mins Perrin Maltese, OTR/L Acute Rehabilitation Services  Office [phone] 08/25/2022

## 2022-08-26 ENCOUNTER — Encounter (HOSPITAL_COMMUNITY)
Admission: EM | Disposition: A | Discharge status: Home / Self Care | Payer: Self-pay | Source: Home / Self Care | Attending: Internal Medicine

## 2022-08-26 ENCOUNTER — Other Ambulatory Visit (HOSPITAL_COMMUNITY): Payer: Self-pay

## 2022-08-26 DIAGNOSIS — R55 Syncope and collapse: Secondary | ICD-10-CM

## 2022-08-26 HISTORY — PX: LOOP RECORDER INSERTION: EP1214

## 2022-08-26 SURGERY — LOOP RECORDER INSERTION

## 2022-08-26 MED ORDER — LIDOCAINE-EPINEPHRINE 1 %-1:100000 IJ SOLN
INTRAMUSCULAR | Status: AC
  Filled 2022-08-26: qty 1

## 2022-08-26 MED ORDER — AMIODARONE HCL 200 MG PO TABS
ORAL_TABLET | ORAL | 0 refills | Status: DC
  Filled 2022-08-26: qty 90, 75d supply, fill #0

## 2022-08-26 MED ORDER — LIDOCAINE-EPINEPHRINE 1 %-1:100000 IJ SOLN
INTRAMUSCULAR | Status: DC | PRN
  Administered 2022-08-26: 15 mL via INTRADERMAL

## 2022-08-26 SURGICAL SUPPLY — 2 items
MONITOR LUX-DX II+ M312 (Prosthesis & Implant Heart) IMPLANT
PACK LOOP INSERTION (CUSTOM PROCEDURE TRAY) ×1 IMPLANT

## 2022-08-26 NOTE — Plan of Care (Signed)
  Problem: Education: Goal: Knowledge of General Education information will improve Description: Including pain rating scale, medication(s)/side effects and non-pharmacologic comfort measures 08/26/2022 0053 by Antony Madura, RN Outcome: Progressing 08/25/2022 2251 by Antony Madura, RN Outcome: Progressing   Problem: Health Behavior/Discharge Planning: Goal: Ability to manage health-related needs will improve Outcome: Progressing   Problem: Clinical Measurements: Goal: Ability to maintain clinical measurements within normal limits will improve 08/26/2022 0053 by Antony Madura, RN Outcome: Progressing 08/25/2022 2251 by Antony Madura, RN Outcome: Progressing Goal: Will remain free from infection Outcome: Progressing Goal: Diagnostic test results will improve Outcome: Progressing Goal: Respiratory complications will improve Outcome: Progressing Goal: Cardiovascular complication will be avoided Outcome: Progressing   Problem: Activity: Goal: Risk for activity intolerance will decrease Outcome: Progressing   Problem: Nutrition: Goal: Adequate nutrition will be maintained Outcome: Progressing   Problem: Coping: Goal: Level of anxiety will decrease Outcome: Progressing   Problem: Elimination: Goal: Will not experience complications related to bowel motility Outcome: Progressing Goal: Will not experience complications related to urinary retention Outcome: Progressing   Problem: Pain Managment: Goal: General experience of comfort will improve Outcome: Progressing   Problem: Safety: Goal: Ability to remain free from injury will improve Outcome: Progressing   Problem: Skin Integrity: Goal: Risk for impaired skin integrity will decrease Outcome: Progressing

## 2022-08-26 NOTE — Discharge Summary (Signed)
PATIENT DETAILS Name: Anne Bradshaw Age: 89 y.o. Sex: female Date of Birth: May 05, 1927 MRN: [id number]. Admitting Physician: Maretta Bees, MD [id number], L.August Saucer, MD  Admit Date: 08/24/2022 Discharge date: 08/26/2022  Recommendations for Outpatient Follow-up:  Follow up with PCP in 1-2 weeks Please obtain CMP/CBC in one week Please follow up with cardiology  Admitted From:  Home  Disposition: Home   Discharge Condition: good  CODE STATUS:   Code Status: DNR   Diet recommendation:  Diet Order             Diet - low sodium heart healthy           Diet Heart Room service appropriate? Yes; Fluid consistency: Thin  Diet effective now                    Brief Summary: Patient is a 89 y.o.  female with history of HTN-who presented with presyncopal episode/fall-found to have SVT.   Significant events: 7/3>> admit to [facility]   Significant studies: 7/3>> CXR: No PNA 7/3>> x-ray left pelvis: No fracture 7/3>> CT head: No acute intracranial abnormality 7/3>> CT C-spine: No fracture. 7/4>> echo: EF 60-65%   Significant microbiology data: None   Procedures: 7/5>>loop recorder placement   Consults: Cardiology  Brief Hospital Course: Presyncope/lightheadedness/dizzy spells resulting in a mechanical fall due to SVT Sinus rhythm Evaluated by EP/cardiology-beta-blocker stopped-started on amiodarone Plans are for loop recorder placement 7/5 Please ensure follow-up with cardiology/EP postdischarge.   HTN BP stable without Coreg for the past 24 hours Holding off on starting any antihypertensives for now-PCP to reassess. May need a alternative agent like diuretics/amlodipine-that does not put the patient at risk for bradycardia as patient already on amiodarone.   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   Underweight: Estimated body mass index is 19.18 kg/m as calculated from the following:   Height as of this encounter: 5' 4.02" (1.626 m).   Weight as of this encounter: 50.7 kg.    Discharge Diagnoses:  Principal Problem:   Falls frequently Active Problems:   SVT (supraventricular tachycardia)   Discharge Instructions:  Activity:  As tolerated   Discharge Instructions     Call MD for:  extreme fatigue   Complete by: As directed    Call MD for:  persistant dizziness or light-headedness   Complete by: As directed    Call MD for:  severe uncontrolled pain   Complete by: As directed    Diet - low sodium heart healthy   Complete by: As directed    Discharge instructions   Complete by: As directed    Follow with Primary MD  Clovis Riley, L.August Saucer, MD in 1-2 weeks  Please follow-up with cardiology-the office will give you a phone call with a follow-up appointment.  If you do not hear from them-please give them a call.  Please get a complete blood count and chemistry panel checked by your Primary MD at your next visit, and again as instructed by your Primary MD.  Get Medicines reviewed and adjusted: Please take all your medications with you for your next visit with your Primary MD  Laboratory/radiological data: Please request your Primary MD to go over all hospital tests and procedure/radiological results at the follow up, please ask your Primary MD to get all Hospital records sent to  his/her office.  In some cases, they will be blood work, cultures and biopsy results pending at the time of your discharge. Please request that your primary care M.D. follows up on these results.  Also Note the following: If you experience worsening of your admission symptoms, develop shortness of breath, life threatening emergency, suicidal or homicidal thoughts you must seek medical attention immediately by calling 911 or calling your MD  immediately  if symptoms less severe.  You must read complete instructions/literature along with all the possible adverse reactions/side effects for all the Medicines you take and that have been prescribed to you. Take any new Medicines after you have completely understood and accpet all the possible adverse reactions/side effects.   Do not drive when taking Pain medications or sleeping medications (Benzodaizepines)  Do not take more than prescribed Pain, Sleep and Anxiety Medications. It is not advisable to combine anxiety,sleep and pain medications without talking with your primary care practitioner  Special Instructions: If you have smoked or chewed Tobacco  in the last 2 yrs please stop smoking, stop any regular Alcohol  and or any Recreational drug use.  Wear Seat belts while driving.  Please note: You were cared for by a hospitalist during your hospital stay. Once you are discharged, your primary care physician will handle any further medical issues. Please note that NO REFILLS for any discharge medications will be authorized once you are discharged, as it is imperative that you return to your primary care physician (or establish a relationship with a primary care physician if you do not have one) for your post hospital discharge needs so that they can reassess your need for medications and monitor your lab values.   Increase activity slowly   Complete by: As directed       Allergies as of 08/26/2022       Reactions   Sulfa Antibiotics         Medication List     STOP taking these medications    carvedilol 3.125 MG tablet Commonly known as: COREG       TAKE these medications    acetaminophen 325 MG tablet Commonly known as: TYLENOL Take 650 mg by mouth daily as needed for mild pain.   amiodarone 200 MG tablet Commonly known as: Pacerone Take 2 tablets (400 mg) twice daily x 5 days, then switch to 1 tablet (200 mg) once daily.   aspirin EC 81 MG tablet Take 1  tablet (81 mg total) by mouth daily. Swallow whole.   cyanocobalamin 1000 MCG tablet Commonly known as: VITAMIN B12 Take 1,000 mcg by mouth daily.   famciclovir 500 MG tablet Commonly known as: FAMVIR TAKE 1 TABLET BY MOUTH EVERY DAY What changed: how much to take   ipratropium-albuterol 0.5-2.5 (3) MG/3ML Soln Commonly known as: DUONEB Take 3 mLs by nebulization every 6 (six) hours as needed.   nitroGLYCERIN 0.4 MG SL tablet Commonly known as: NITROSTAT Take one every five minutes under your tongue for up to 3 doses if you have onset of  shortness of breath.   prednisoLONE acetate 1 % ophthalmic suspension Commonly known as: PRED FORTE 1 DROP IN BOTH EYES EVERY OTHER DAY What changed:  how much to take how to take this when to take this   Systane 0.4-0.3 % Soln Generic drug: Polyethyl Glycol-Propyl Glycol Apply 1 drop to eye 4 (four) times daily.   Vitamin D3 250 MCG (10000 UT) capsule Take 10,000 Units by mouth daily.  Durable Medical Equipment  (From admission, onward)           Start     Ordered   08/25/22 1134  For home use only DME 4 wheeled rolling walker with seat  Once       Question Answer Comment  Patient needs a walker to treat with the following condition SVT (supraventricular tachycardia)   Patient needs a walker to treat with the following condition Syncopal episodes      08/25/22 1135            Follow-up Information     Clovis Riley, L.August Saucer, MD. Schedule an appointment as soon as possible for a visit in 1 week(s).   Specialty: Family Medicine Contact information: [address] [place] [address] [phone]         Kathleene Hazel, MD. Schedule an appointment as soon as possible for a visit in 1 week(s).   Specialty: Cardiology Contact information: [address] [place] [address] [phone]                Allergies  Allergen Reactions   Sulfa Antibiotics       Other Procedures/Studies: EP PPM/ICD IMPLANT  Result Date: 08/26/2022 CONCLUSIONS:  1. Successful implantation of a implantable loop recorder for Syncope and SVT  2. No early apparent complications.   ECHOCARDIOGRAM COMPLETE  Result Date: 08/25/2022    ECHOCARDIOGRAM REPORT   Patient Name:   Anne Bradshaw Date of Exam: 08/25/2022 Medical Rec #:  [id number]        Height:       64.0 in Accession #:    [id number]       Weight:       111.8 lb Date of Birth:  09-01-1927        BSA:          1.528 m Patient Age:    89 years         BP:           139/65 mmHg Patient Gender: F                HR:           59 bpm. Exam Location:  Inpatient Procedure: 2D Echo, Cardiac Doppler and Color Doppler Indications:    Syncope R55  History:        Patient has prior history of Echocardiogram examinations, most                 recent 10/01/2021. Risk Factors:Hypertension.  Sonographer:    Lucendia Herrlich Referring Phys: [id number] Dewayne Shorter M Saphyra Hutt IMPRESSIONS  1. Left ventricular ejection fraction, by estimation, is 60 to 65%. The left ventricle has normal function. The left ventricle has no regional wall motion abnormalities. Left ventricular diastolic parameters were normal.  2. Right ventricular systolic function is normal. The right ventricular size is normal.  3. The mitral valve is abnormal. Trivial mitral valve regurgitation. No evidence of mitral stenosis.  4. The aortic valve is tricuspid. There is mild calcification of the aortic valve. Aortic valve regurgitation is trivial. Aortic valve sclerosis is present, with no evidence of aortic valve stenosis.  5. The inferior vena cava is normal in size with greater than 50% respiratory variability, suggesting right atrial pressure of 3 mmHg. FINDINGS  Left Ventricle: Left ventricular ejection fraction, by estimation, is 60 to 65%. The left ventricle has normal function. The left ventricle has no regional wall motion  abnormalities. The left ventricular internal cavity size  was normal in size. There is  no left ventricular hypertrophy. Left ventricular diastolic parameters were normal. Right Ventricle: The right ventricular size is normal. No increase in right ventricular wall thickness. Right ventricular systolic function is normal. Left Atrium: Left atrial size was normal in size. Right Atrium: Right atrial size was normal in size. Pericardium: There is no evidence of pericardial effusion. Mitral Valve: The mitral valve is abnormal. There is moderate thickening of the mitral valve leaflet(s). There is moderate calcification of the mitral valve leaflet(s). Mild mitral annular calcification. Trivial mitral valve regurgitation. No evidence of  mitral valve stenosis. Tricuspid Valve: The tricuspid valve is normal in structure. Tricuspid valve regurgitation is mild . No evidence of tricuspid stenosis. Aortic Valve: The aortic valve is tricuspid. There is mild calcification of the aortic valve. Aortic valve regurgitation is trivial. Aortic valve sclerosis is present, with no evidence of aortic valve stenosis. Aortic valve peak gradient measures 6.7 mmHg. Pulmonic Valve: The pulmonic valve was normal in structure. Pulmonic valve regurgitation is not visualized. No evidence of pulmonic stenosis. Aorta: The aortic root is normal in size and structure. Venous: The inferior vena cava is normal in size with greater than 50% respiratory variability, suggesting right atrial pressure of 3 mmHg. IAS/Shunts: No atrial level shunt detected by color flow Doppler.  LEFT VENTRICLE PLAX 2D LVIDd:         3.80 cm   Diastology LVIDs:         2.70 cm   LV e' medial:    9.79 cm/s LV PW:         0.90 cm   LV E/e' medial:  10.2 LV IVS:        1.00 cm   LV e' lateral:   9.79 cm/s LVOT diam:     1.90 cm   LV E/e' lateral: 10.2 LV SV:         63 LV SV Index:   41 LVOT Area:     2.84 cm                           3D Volume EF:                          3D EF:        65 %                          LV EDV:       83 ml                           LV ESV:       29 ml                          LV SV:        54 ml RIGHT VENTRICLE             IVC RV S prime:     11.80 cm/s  IVC diam: 1.10 cm TAPSE (M-mode): 1.9 cm LEFT ATRIUM             Index        RIGHT ATRIUM           Index LA diam:  2.70 cm 1.77 cm/m   RA Area:     12.40 cm LA Vol (A2C):   39.0 ml 25.53 ml/m  RA Volume:   26.40 ml  17.28 ml/m LA Vol (A4C):   31.0 ml 20.29 ml/m LA Biplane Vol: 34.8 ml 22.78 ml/m  AORTIC VALVE AV Area (Vmax): 2.29 cm AV Vmax:        129.00 cm/s AV Peak Grad:   6.7 mmHg LVOT Vmax:      104.00 cm/s LVOT Vmean:     67.000 cm/s LVOT VTI:       0.223 m  AORTA Ao Root diam: 3.20 cm Ao Asc diam:  3.00 cm MITRAL VALVE                TRICUSPID VALVE MV Area (PHT): 3.60 cm     TR Peak grad:   19.0 mmHg MV Decel Time: 211 msec     TR Vmax:        218.00 cm/s MV E velocity: 100.00 cm/s MV A velocity: 107.00 cm/s  SHUNTS MV E/A ratio:  0.93         Systemic VTI:  0.22 m                             Systemic Diam: 1.90 cm Charlton Haws MD Electronically signed by Charlton Haws MD Signature Date/Time: 08/25/2022/3:31:47 PM    Final    CT Head Wo Contrast  Result Date: 08/24/2022 CLINICAL DATA:  Provided history: Head trauma, minor. Neck trauma. Additional history provided: Fall. Dizziness. EXAM: CT HEAD WITHOUT CONTRAST CT CERVICAL SPINE WITHOUT CONTRAST TECHNIQUE: Multidetector CT imaging of the head and cervical spine was performed following the standard protocol without intravenous contrast. Multiplanar CT image reconstructions of the cervical spine were also generated. RADIATION DOSE REDUCTION: This exam was performed according to the departmental dose-optimization program which includes automated exposure control, adjustment of the mA and/or kV according to patient size and/or use of iterative reconstruction technique. COMPARISON:  Head CT 03/15/2021.  Cervical spine CT 03/11/2018. FINDINGS: CT HEAD FINDINGS Brain: Generalized cerebral atrophy.  Redemonstrated chronic lacunar infarct within the left corona radiata/basal ganglia. Background mild patchy and ill-defined hypoattenuation within the cerebral white matter, nonspecific but compatible with chronic small vessel ischemic disease. Small chronic infarct within the left cerebellar hemisphere, new from the prior head CT of 03/15/2021. There is no acute intracranial hemorrhage. No demarcated cortical infarct. No extra-axial fluid collection. No evidence of an intracranial mass. No midline shift. Vascular: No hyperdense vessel. Atherosclerotic calcifications. Skull: No calvarial fracture or aggressive osseous lesion. Unchanged chronic thinned appearance of the bilateral parietal calvarium. Sinuses/Orbits: No mass or acute finding within the imaged orbits. No significant paranasal sinus disease. CT CERVICAL SPINE FINDINGS Alignment: Mild C5-C6 grade 1 retrolisthesis. Skull base and vertebrae: The basion-dental and atlanto-dental intervals are maintained.No evidence of acute fracture to the cervical spine. 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 Soft tissues and spinal canal: No prevertebral fluid or swelling. No visible canal hematoma. Disc levels: Cervical spondylosis with multilevel disc space narrowing, disc bulges/central disc protrusions, posterior disc osteophyte complexes and uncovertebral hypertrophy. Disc space narrowing is greatest at C4-C5 and C5-C6 (moderate-to-advanced at these levels). Multilevel spinal canal narrowing. Most notably at C3-C4, a central disc protrusion contributes to apparent mild/moderate spinal canal stenosis. Bony neural foraminal narrowing on the right at C4-C5 and bilaterally at C5-C6. Upper chest: No consolidation within the imaged lung apices. No  visible pneumothorax. Biapical pleuroparenchymal scarring. IMPRESSION: CT head: 1.  No evidence of an acute intracranial abnormality. 2. Parenchymal atrophy, chronic small vessel disease and chronic infarcts as described. CT  cervical spine: 1. No evidence of an acute cervical spine fracture. 2. Mild C5-C6 grade 1 retrolisthesis. 3. Cervical spondylosis as described. 4. 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 Electronically Signed   By: Jackey Loge D.O.   On: 08/24/2022 15:17   CT Cervical Spine Wo Contrast  Result Date: 08/24/2022 CLINICAL DATA:  Provided history: Head trauma, minor. Neck trauma. Additional history provided: Fall. Dizziness. EXAM: CT HEAD WITHOUT CONTRAST CT CERVICAL SPINE WITHOUT CONTRAST TECHNIQUE: Multidetector CT imaging of the head and cervical spine was performed following the standard protocol without intravenous contrast. Multiplanar CT image reconstructions of the cervical spine were also generated. RADIATION DOSE REDUCTION: This exam was performed according to the departmental dose-optimization program which includes automated exposure control, adjustment of the mA and/or kV according to patient size and/or use of iterative reconstruction technique. COMPARISON:  Head CT 03/15/2021.  Cervical spine CT 03/11/2018. FINDINGS: CT HEAD FINDINGS Brain: Generalized cerebral atrophy. Redemonstrated chronic lacunar infarct within the left corona radiata/basal ganglia. Background mild patchy and ill-defined hypoattenuation within the cerebral white matter, nonspecific but compatible with chronic small vessel ischemic disease. Small chronic infarct within the left cerebellar hemisphere, new from the prior head CT of 03/15/2021. There is no acute intracranial hemorrhage. No demarcated cortical infarct. No extra-axial fluid collection. No evidence of an intracranial mass. No midline shift. Vascular: No hyperdense vessel. Atherosclerotic calcifications. Skull: No calvarial fracture or aggressive osseous lesion. Unchanged chronic thinned appearance of the bilateral parietal calvarium. Sinuses/Orbits: No mass or acute finding within the imaged orbits. No significant paranasal sinus disease. CT CERVICAL SPINE FINDINGS  Alignment: Mild C5-C6 grade 1 retrolisthesis. Skull base and vertebrae: The basion-dental and atlanto-dental intervals are maintained.No evidence of acute fracture to the cervical spine. 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 Soft tissues and spinal canal: No prevertebral fluid or swelling. No visible canal hematoma. Disc levels: Cervical spondylosis with multilevel disc space narrowing, disc bulges/central disc protrusions, posterior disc osteophyte complexes and uncovertebral hypertrophy. Disc space narrowing is greatest at C4-C5 and C5-C6 (moderate-to-advanced at these levels). Multilevel spinal canal narrowing. Most notably at C3-C4, a central disc protrusion contributes to apparent mild/moderate spinal canal stenosis. Bony neural foraminal narrowing on the right at C4-C5 and bilaterally at C5-C6. Upper chest: No consolidation within the imaged lung apices. No visible pneumothorax. Biapical pleuroparenchymal scarring. IMPRESSION: CT head: 1.  No evidence of an acute intracranial abnormality. 2. Parenchymal atrophy, chronic small vessel disease and chronic infarcts as described. CT cervical spine: 1. No evidence of an acute cervical spine fracture. 2. Mild C5-C6 grade 1 retrolisthesis. 3. Cervical spondylosis as described. 4. 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 Electronically Signed   By: Jackey Loge D.O.   On: 08/24/2022 15:17   DG Chest Port 1 View  Result Date: 08/24/2022 CLINICAL DATA:  Intermittent dizziness for 2-3 months.  Fall. EXAM: PORTABLE CHEST 1 VIEW COMPARISON:  Chest radiograph 08/25/2021. FINDINGS: The cardiomediastinal silhouette is normal There is no focal consolidation or pulmonary edema. There is no pleural effusion or pneumothorax. Biapical pleural scarring is unchanged. There is no acute osseous abnormality. IMPRESSION: Stable chest with no radiographic evidence of acute cardiopulmonary process. Electronically Signed   By: Lesia Hausen M.D.   On: 08/24/2022 13:02   DG Hip Unilat W  or Wo Pelvis 2-3 Views  Left  Result Date: 08/24/2022 CLINICAL DATA:  Fall with pain to the left lower extremity EXAM: DG HIP (WITH OR WITHOUT PELVIS) 3V LEFT COMPARISON:  None Available. FINDINGS: There is no evidence of hip fracture or dislocation. There is no evidence of arthropathy or other focal bone abnormality. Capsular radiodensity projects over the left hemiabdomen, likely ingested within bowel. Surgical clips project over the right hip. IMPRESSION: No acute fracture or dislocation. Electronically Signed   By: Agustin Cree M.D.   On: 08/24/2022 13:00     TODAY-DAY OF DISCHARGE:  Subjective:   Anne Bradshaw today has no headache,no chest abdominal pain,no new weakness tingling or numbness, feels much better wants to go home today.   Objective:   Blood pressure (!) 159/54, pulse (!) 58, temperature 99 F (37.2 C), temperature source Oral, resp. rate 18, height 5' 4.02" (1.626 m), weight 50.7 kg, SpO2 90 %.  Intake/Output Summary (Last 24 hours) at 08/26/2022 1610 Last data filed at 08/26/2022 0941 Gross per 24 hour  Intake 3 ml  Output --  Net 3 ml   Filed Weights   08/24/22 1158 08/24/22 1817  Weight: 49.4 kg 50.7 kg    Exam: Awake Alert, Oriented *3, No new F.N deficits, Normal affect [place].AT,PERRAL Supple Neck,No JVD, No cervical lymphadenopathy appriciated.  Symmetrical Chest wall movement, Good air movement bilaterally, CTAB RRR,No Gallops,Rubs or new Murmurs, No Parasternal Heave +ve B.Sounds, Abd Soft, Non tender, No organomegaly appriciated, No rebound -guarding or rigidity. No Cyanosis, Clubbing or edema, No new Rash or bruise   PERTINENT RADIOLOGIC STUDIES: EP PPM/ICD IMPLANT  Result Date: 08/26/2022 CONCLUSIONS:  1. Successful implantation of a implantable loop recorder for Syncope and SVT  2. No early apparent complications.   ECHOCARDIOGRAM COMPLETE  Result Date: 08/25/2022    ECHOCARDIOGRAM REPORT   Patient Name:   Anne Bradshaw Date of Exam: 08/25/2022 Medical  Rec #:  [id number]        Height:       64.0 in Accession #:    [id number]       Weight:       111.8 lb Date of Birth:  10-28-1927        BSA:          1.528 m Patient Age:    89 years         BP:           139/65 mmHg Patient Gender: F                HR:           59 bpm. Exam Location:  Inpatient Procedure: 2D Echo, Cardiac Doppler and Color Doppler Indications:    Syncope R55  History:        Patient has prior history of Echocardiogram examinations, most                 recent 10/01/2021. Risk Factors:Hypertension.  Sonographer:    Lucendia Herrlich Referring Phys: [id number] Dewayne Shorter M Leslee Haueter IMPRESSIONS  1. Left ventricular ejection fraction, by estimation, is 60 to 65%. The left ventricle has normal function. The left ventricle has no regional wall motion abnormalities. Left ventricular diastolic parameters were normal.  2. Right ventricular systolic function is normal. The right ventricular size is normal.  3. The mitral valve is abnormal. Trivial mitral valve regurgitation. No evidence of mitral stenosis.  4. The aortic valve is tricuspid. There is mild calcification of the aortic valve. Aortic valve regurgitation is  trivial. Aortic valve sclerosis is present, with no evidence of aortic valve stenosis.  5. The inferior vena cava is normal in size with greater than 50% respiratory variability, suggesting right atrial pressure of 3 mmHg. FINDINGS  Left Ventricle: Left ventricular ejection fraction, by estimation, is 60 to 65%. The left ventricle has normal function. The left ventricle has no regional wall motion abnormalities. The left ventricular internal cavity size was normal in size. There is  no left ventricular hypertrophy. Left ventricular diastolic parameters were normal. Right Ventricle: The right ventricular size is normal. No increase in right ventricular wall thickness. Right ventricular systolic function is normal. Left Atrium: Left atrial size was normal in size. Right Atrium: Right atrial size was normal  in size. Pericardium: There is no evidence of pericardial effusion. Mitral Valve: The mitral valve is abnormal. There is moderate thickening of the mitral valve leaflet(s). There is moderate calcification of the mitral valve leaflet(s). Mild mitral annular calcification. Trivial mitral valve regurgitation. No evidence of  mitral valve stenosis. Tricuspid Valve: The tricuspid valve is normal in structure. Tricuspid valve regurgitation is mild . No evidence of tricuspid stenosis. Aortic Valve: The aortic valve is tricuspid. There is mild calcification of the aortic valve. Aortic valve regurgitation is trivial. Aortic valve sclerosis is present, with no evidence of aortic valve stenosis. Aortic valve peak gradient measures 6.7 mmHg. Pulmonic Valve: The pulmonic valve was normal in structure. Pulmonic valve regurgitation is not visualized. No evidence of pulmonic stenosis. Aorta: The aortic root is normal in size and structure. Venous: The inferior vena cava is normal in size with greater than 50% respiratory variability, suggesting right atrial pressure of 3 mmHg. IAS/Shunts: No atrial level shunt detected by color flow Doppler.  LEFT VENTRICLE PLAX 2D LVIDd:         3.80 cm   Diastology LVIDs:         2.70 cm   LV e' medial:    9.79 cm/s LV PW:         0.90 cm   LV E/e' medial:  10.2 LV IVS:        1.00 cm   LV e' lateral:   9.79 cm/s LVOT diam:     1.90 cm   LV E/e' lateral: 10.2 LV SV:         63 LV SV Index:   41 LVOT Area:     2.84 cm                           3D Volume EF:                          3D EF:        65 %                          LV EDV:       83 ml                          LV ESV:       29 ml                          LV SV:        54 ml RIGHT VENTRICLE  IVC RV S prime:     11.80 cm/s  IVC diam: 1.10 cm TAPSE (M-mode): 1.9 cm LEFT ATRIUM             Index        RIGHT ATRIUM           Index LA diam:        2.70 cm 1.77 cm/m   RA Area:     12.40 cm LA Vol (A2C):   39.0 ml 25.53 ml/m  RA  Volume:   26.40 ml  17.28 ml/m LA Vol (A4C):   31.0 ml 20.29 ml/m LA Biplane Vol: 34.8 ml 22.78 ml/m  AORTIC VALVE AV Area (Vmax): 2.29 cm AV Vmax:        129.00 cm/s AV Peak Grad:   6.7 mmHg LVOT Vmax:      104.00 cm/s LVOT Vmean:     67.000 cm/s LVOT VTI:       0.223 m  AORTA Ao Root diam: 3.20 cm Ao Asc diam:  3.00 cm MITRAL VALVE                TRICUSPID VALVE MV Area (PHT): 3.60 cm     TR Peak grad:   19.0 mmHg MV Decel Time: 211 msec     TR Vmax:        218.00 cm/s MV E velocity: 100.00 cm/s MV A velocity: 107.00 cm/s  SHUNTS MV E/A ratio:  0.93         Systemic VTI:  0.22 m                             Systemic Diam: 1.90 cm Charlton Haws MD Electronically signed by Charlton Haws MD Signature Date/Time: 08/25/2022/3:31:47 PM    Final      PERTINENT LAB RESULTS: CBC: Recent Labs    08/24/22 2052 08/25/22 0139  WBC 6.7 6.1  HGB 12.1 11.4*  HCT 36.4 34.2*  PLT 144* 142*   CMET CMP     Component Value Date/Time   NA 136 08/25/2022 0139   K 3.8 08/25/2022 0139   CL 106 08/25/2022 0139   CO2 22 08/25/2022 0139   GLUCOSE 117 (H) 08/25/2022 0139   BUN 15 08/25/2022 0139   CREATININE 1.04 (H) 08/25/2022 0139   CALCIUM 8.8 (L) 08/25/2022 0139   CALCIUM 13.1 (HH) 03/11/2018 2258   PROT 6.3 (L) 08/24/2022 1209   ALBUMIN 3.1 (L) 08/24/2022 1209   AST 25 08/24/2022 1209   ALT 20 08/24/2022 1209   ALKPHOS 56 08/24/2022 1209   BILITOT 0.6 08/24/2022 1209   GFRNONAA 50 (L) 08/25/2022 0139    GFR Estimated Creatinine Clearance: 27.6 mL/min (A) (by C-G formula based on SCr of 1.04 mg/dL (H)). No results for input(s): "LIPASE", "AMYLASE" in the last 72 hours. No results for input(s): "CKTOTAL", "CKMB", "CKMBINDEX", "TROPONINI" in the last 72 hours. Invalid input(s): "POCBNP" No results for input(s): "DDIMER" in the last 72 hours. No results for input(s): "HGBA1C" in the last 72 hours. No results for input(s): "CHOL", "HDL", "LDLCALC", "TRIG", "CHOLHDL", "LDLDIRECT" in the last 72  hours. Recent Labs    08/24/22 1209  TSH 3.111   No results for input(s): "VITAMINB12", "FOLATE", "FERRITIN", "TIBC", "IRON", "RETICCTPCT" in the last 72 hours. Coags: No results for input(s): "INR" in the last 72 hours.  Invalid input(s): "PT" Microbiology: No results found for this or any previous visit (from the past 240  hour(s)).  FURTHER DISCHARGE INSTRUCTIONS:  Get Medicines reviewed and adjusted: Please take all your medications with you for your next visit with your Primary MD  Laboratory/radiological data: Please request your Primary MD to go over all hospital tests and procedure/radiological results at the follow up, please ask your Primary MD to get all Hospital records sent to his/her office.  In some cases, they will be blood work, cultures and biopsy results pending at the time of your discharge. Please request that your primary care M.D. goes through all the records of your hospital data and follows up on these results.  Also Note the following: If you experience worsening of your admission symptoms, develop shortness of breath, life threatening emergency, suicidal or homicidal thoughts you must seek medical attention immediately by calling 911 or calling your MD immediately  if symptoms less severe.  You must read complete instructions/literature along with all the possible adverse reactions/side effects for all the Medicines you take and that have been prescribed to you. Take any new Medicines after you have completely understood and accpet all the possible adverse reactions/side effects.   Do not drive when taking Pain medications or sleeping medications (Benzodaizepines)  Do not take more than prescribed Pain, Sleep and Anxiety Medications. It is not advisable to combine anxiety,sleep and pain medications without talking with your primary care practitioner  Special Instructions: If you have smoked or chewed Tobacco  in the last 2 yrs please stop smoking, stop any  regular Alcohol  and or any Recreational drug use.  Wear Seat belts while driving.  Please note: You were cared for by a hospitalist during your hospital stay. Once you are discharged, your primary care physician will handle any further medical issues. Please note that NO REFILLS for any discharge medications will be authorized once you are discharged, as it is imperative that you return to your primary care physician (or establish a relationship with a primary care physician if you do not have one) for your post hospital discharge needs so that they can reassess your need for medications and monitor your lab values.  Total Time spent coordinating discharge including counseling, education and face to face time equals greater than 30 minutes.  SignedJeoffrey Massed 08/26/2022 4:10 PM

## 2022-08-26 NOTE — Discharge Instructions (Signed)
Care After Your Loop Recorder  You have a [employer] Loop Recorder   Monitor your cardiac device site for redness, swelling, and drainage. Call the device clinic at [phone] if you experience these symptoms or fever/chills.  If you notice bleeding from your site, hold firm, but gently pressure with two fingers for 5 minutes. Dried blood on the steri-strips when removing the outer bandage is normal.   Keep the large square bandage on your site for 24 hours and then you may remove it yourself. Keep the steri-strips underneath in place.   You may shower after 72 hours / 3 days from your procedure with the steri-strips in place. They will usually fall off on their own, or may be removed after 10 days. Pat dry.   Avoid lotions, ointments, or perfumes over your incision until it is well-healed.  Please do not submerge in water until your site is completely healed.   Your device is MRI compatible.   Remote monitoring is used to monitor your cardiac device from home. This monitoring is scheduled every month by our office. It allows us to keep an eye on the function of your device to ensure it is working properly.    

## 2022-08-26 NOTE — Progress Notes (Addendum)
Rounding Note    Patient Name: Anne Bradshaw Date of Encounter: 08/26/2022  [facility] Cardiologist: Verne Carrow, MD   Subjective   Feels fine today, wants to go home  Inpatient Medications    Scheduled Meds:  amiodarone  400 mg Oral BID   Followed by   Melene Muller ON 08/30/2022] amiodarone  200 mg Oral Daily   aspirin EC  81 mg Oral Daily   cholecalciferol  5,000 Units Oral Daily   cyanocobalamin  1,000 mcg Oral Daily   enoxaparin (LOVENOX) injection  30 mg Subcutaneous Q24H   polyvinyl alcohol  1 drop Both Eyes TID AC & HS   prednisoLONE acetate  1 drop Both Eyes QODAY   sodium chloride flush  3 mL Intravenous Q12H   valACYclovir  1,000 mg Oral Daily   Continuous Infusions:  sodium chloride     PRN Meds: sodium chloride, acetaminophen **OR** acetaminophen, hydrALAZINE, ipratropium-albuterol, nitroGLYCERIN, ondansetron **OR** ondansetron (ZOFRAN) IV, polyethylene glycol, sodium chloride flush   Vital Signs    Vitals:   08/25/22 2100 08/25/22 2200 08/26/22 0000 08/26/22 0300  BP: (!) 140/62  (!) 125/51 (!) 107/55  Pulse: 63 64 (!) 59 60  Resp: (!) 25 (!) 24 (!) 25 (!) 25  Temp: 98 F (36.7 C)  98.5 F (36.9 C) 97.7 F (36.5 C)  TempSrc: Oral  Oral Oral  SpO2: 91% 90% 90% 90%  Weight:      Height:        Intake/Output Summary (Last 24 hours) at 08/26/2022 0835 Last data filed at 08/25/2022 1330 Gross per 24 hour  Intake 243 ml  Output --  Net 243 ml      08/24/2022    6:17 PM 08/24/2022   11:58 AM 09/16/2021    1:52 PM  Last 3 Weights  Weight (lbs) 111 lb 12.4 oz 109 lb 119 lb 12.8 oz  Weight (kg) 50.7 kg 49.442 kg 54.341 kg      Telemetry    SR/SB, mostly 50's, no AV block or significant bradycardia, no SVT - Personally Reviewed  ECG    No new EKGs - Personally Reviewed  Physical Exam   GEN: No acute distress.   Neck: No JVD Cardiac: RRR, no murmurs, rubs, or gallops.  Respiratory: Clear to auscultation bilaterally. GI: Soft,  nontender, non-distended  MS: No edema; advanced atrophy Neuro:  Nonfocal  Psych: Normal affect   Labs    High Sensitivity Troponin:  No results for input(s): "TROPONINIHS" in the last 720 hours.   Chemistry Recent Labs  Lab 08/24/22 1209 08/24/22 2052 08/25/22 0139  NA 137  --  136  K 4.7  --  3.8  CL 106  --  106  CO2 25  --  22  GLUCOSE 128*  --  117*  BUN 15  --  15  CREATININE 0.97 0.99 1.04*  CALCIUM 8.8*  --  8.8*  MG 2.1  --   --   PROT 6.3*  --   --   ALBUMIN 3.1*  --   --   AST 25  --   --   ALT 20  --   --   ALKPHOS 56  --   --   BILITOT 0.6  --   --   GFRNONAA 55* 53* 50*  ANIONGAP 6  --  8    Lipids No results for input(s): "CHOL", "TRIG", "HDL", "LABVLDL", "LDLCALC", "CHOLHDL" in the last 168 hours.  Hematology Recent Labs  Lab 08/24/22 1209 08/24/22 2052 08/25/22 0139  WBC 6.7 6.7 6.1  RBC 3.97 3.73* 3.45*  HGB 13.0 12.1 11.4*  HCT 39.6 36.4 34.2*  MCV 99.7 97.6 99.1  MCH 32.7 32.4 33.0  MCHC 32.8 33.2 33.3  RDW 13.9 14.0 14.1  PLT 157 144* 142*   Thyroid  Recent Labs  Lab 08/24/22 1209  TSH 3.111    BNPNo results for input(s): "BNP", "PROBNP" in the last 168 hours.  DDimer No results for input(s): "DDIMER" in the last 168 hours.   Radiology     CT Head Wo Contrast Result Date: 08/24/2022 CLINICAL DATA:  Provided history: Head trauma, minor. Neck trauma. Additional history provided: Fall. Dizziness. EXAM: CT HEAD WITHOUT CONTRAST CT CERVICAL SPINE WITHOUT CONTRAST TECHNIQUE: Multidetector CT imaging of the head and cervical spine was performed following the standard protocol without intravenous contrast. Multiplanar CT image reconstructions of the cervical spine were also generated. RADIATION DOSE REDUCTION: This exam was performed according to the departmental dose-optimization program which includes automated exposure control, adjustment of the mA and/or kV according to patient size and/or use of iterative reconstruction technique.  COMPARISON:  Head CT 03/15/2021.  Cervical spine CT 03/11/2018. FINDINGS: CT HEAD FINDINGS Brain: Generalized cerebral atrophy. Redemonstrated chronic lacunar infarct within the left corona radiata/basal ganglia. Background mild patchy and ill-defined hypoattenuation within the cerebral white matter, nonspecific but compatible with chronic small vessel ischemic disease. Small chronic infarct within the left cerebellar hemisphere, new from the prior head CT of 03/15/2021. There is no acute intracranial hemorrhage. No demarcated cortical infarct. No extra-axial fluid collection. No evidence of an intracranial mass. No midline shift. Vascular: No hyperdense vessel. Atherosclerotic calcifications. Skull: No calvarial fracture or aggressive osseous lesion. Unchanged chronic thinned appearance of the bilateral parietal calvarium. Sinuses/Orbits: No mass or acute finding within the imaged orbits. No significant paranasal sinus disease. CT CERVICAL SPINE FINDINGS Alignment: Mild C5-C6 grade 1 retrolisthesis. Skull base and vertebrae: The basion-dental and atlanto-dental intervals are maintained.No evidence of acute fracture to the cervical spine. 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 Soft tissues and spinal canal: No prevertebral fluid or swelling. No visible canal hematoma. Disc levels: Cervical spondylosis with multilevel disc space narrowing, disc bulges/central disc protrusions, posterior disc osteophyte complexes and uncovertebral hypertrophy. Disc space narrowing is greatest at C4-C5 and C5-C6 (moderate-to-advanced at these levels). Multilevel spinal canal narrowing. Most notably at C3-C4, a central disc protrusion contributes to apparent mild/moderate spinal canal stenosis. Bony neural foraminal narrowing on the right at C4-C5 and bilaterally at C5-C6. Upper chest: No consolidation within the imaged lung apices. No visible pneumothorax. Biapical pleuroparenchymal scarring. IMPRESSION: CT head: 1.  No evidence of an  acute intracranial abnormality. 2. Parenchymal atrophy, chronic small vessel disease and chronic infarcts as described. CT cervical spine: 1. No evidence of an acute cervical spine fracture. 2. Mild C5-C6 grade 1 retrolisthesis. 3. Cervical spondylosis as described. 4. 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 Electronically Signed   By: Jackey Loge D.O.   On: 08/24/2022 15:17   DG Chest Port 1 View Result Date: 08/24/2022 CLINICAL DATA:  Intermittent dizziness for 2-3 months.  Fall. EXAM: PORTABLE CHEST 1 VIEW COMPARISON:  Chest radiograph 08/25/2021. FINDINGS: The cardiomediastinal silhouette is normal There is no focal consolidation or pulmonary edema. There is no pleural effusion or pneumothorax. Biapical pleural scarring is unchanged. There is no acute osseous abnormality. IMPRESSION: Stable chest with no radiographic evidence of acute cardiopulmonary process. Electronically Signed   By: Theron Arista  Noone M.D.   On: 08/24/2022 13:02   DG Hip Unilat W or Wo Pelvis 2-3 Views Left Result Date: 08/24/2022 CLINICAL DATA:  Fall with pain to the left lower extremity EXAM: DG HIP (WITH OR WITHOUT PELVIS) 3V LEFT COMPARISON:  None Available. FINDINGS: There is no evidence of hip fracture or dislocation. There is no evidence of arthropathy or other focal bone abnormality. Capsular radiodensity projects over the left hemiabdomen, likely ingested within bowel. Surgical clips project over the right hip. IMPRESSION: No acute fracture or dislocation. Electronically Signed   By: Agustin Cree M.D.   On: 08/24/2022 13:00    Cardiac Studies   08/25/22: TTE 1. Left ventricular ejection fraction, by estimation, is 60 to 65%. The  left ventricle has normal function. The left ventricle has no regional  wall motion abnormalities. Left ventricular diastolic parameters were  normal.   2. Right ventricular systolic function is normal. The right ventricular  size is normal.   3. The mitral valve is abnormal. Trivial mitral valve  regurgitation. No  evidence of mitral stenosis.   4. The aortic valve is tricuspid. There is mild calcification of the  aortic valve. Aortic valve regurgitation is trivial. Aortic valve  sclerosis is present, with no evidence of aortic valve stenosis.   5. The inferior vena cava is normal in size with greater than 50%  respiratory variability, suggesting right atrial pressure of 3 mmHg.   Patient Profile     89 y.o. female w/PMHx of GERD, HTN, new SVT admitted with recurrent dizzy spells and found with SVT  EMS on arrival evaluated patient and was found to be in SVT, heart rates 170s.  Valsalva maneuver was attempted however was unsuccessful.  However was converted back to normal sinus rhythm after 6 mg of adenosine was administered. HR back to 80s.   Assessment & Plan    Dizzy spells near syncope Reported increasingly frequent to the degree of having to hold on to things until it passes SB SVT  With some baseline bradycardia her coreg stopped >> amiodarone Amiodarone 400mg  BID for 5 days then 200mg  daily Planned for loop implant today to monitor symptoms if associated to any HR/rhythm and management of her SVT  Pt remains agreeable   For questions or updates, please contact [facility] Please consult [email or web address] for contact info under        Signed, Sheilah Pigeon, PA-C  08/26/2022, 8:35 AM

## 2022-08-26 NOTE — TOC Transition Note (Signed)
Transition of Care [facility]) - CM/SW Discharge Note   Patient Details  Name: Anne Bradshaw MRN: [id number] Date of Birth: July 30, 1927  [facility]) CM/SW Contact:  Gordy Clement, RN Phone Number: 08/26/2022, 4:20 PM   Clinical Narrative:    Patient to dc to home today  Rollator has been delivered. Family will transport  No additional TOC needs            Patient Goals and CMS Choice      Discharge Placement                         Discharge Plan and Services Additional resources added to the After Visit Summary for                                       Social Determinants of Health (SDOH) Interventions SDOH Screenings   Food Insecurity: No Food Insecurity (08/24/2022)  Housing: Low Risk  (08/24/2022)  Transportation Needs: No Transportation Needs (08/24/2022)  Utilities: Not At Risk (08/24/2022)  Tobacco Use: Low Risk  (08/24/2022)     Readmission Risk Interventions     No data to display

## 2022-08-26 NOTE — Progress Notes (Signed)
PREPROCEDURE DIAGNOSIS: Syncope and SVT  POSTPROCEDURE DIAGNOSIS: Syncope and SVT  PROCEDURES:  1. Implantable loop recorder implantation  INTRODUCTION: Anne Bradshaw presents with a history of syncope The costs of loop recorder monitoring have been discussed with the patient.  DESCRIPTION OF PROCEDURE: Informed written consent was obtained. A preprocedural timeout was performed with the RN Marquita Palms). The patient required no sedation for the procedure today. Mapping over the patient's chest was performed to identify the area where electrograms were most prominent for ILR recording. This area was found to be the left parasternal region over the 4th intercostal space. The patients left chest was therefore prepped and draped in the usual sterile fashion. The skin overlying the left parasternal region was infiltrated with lidocaine for local analgesia. A 0.5-cm incision was made over the left parasternal region over the 3rd intercostal space. A subcutaneous ILR pocket was fashioned using a combination of sharp and blunt dissection. A [employer] [id number] [id number]) implantable loop recorder was then placed into the pocket R waves were very prominent and measured >0.59mV. Steri- Strips and a sterile dressing were then applied. There were no early apparent complications.   Steffanie Dunn, MD was present during the entirety of the case for proctoring and supervision.   CONCLUSIONS:  1. Successful implantation of a implantable loop recorder for Syncope and SVT 2. No early apparent complications.   Francis Dowse, PA-C

## 2022-08-27 NOTE — Progress Notes (Addendum)
Correction for date - 08/27/22 - 10:15 - Received message from Delicia, RN stating that pt was DC yesterday and she did not get the rollator that was ordered. Lynne conntacted [place] with [employer] for the DME referral. Contacted Vaughan Basta and he reports that he will take care of it. Contacted pt and let her know that [employer] will deliver the rollator.

## 2022-08-29 ENCOUNTER — Encounter (HOSPITAL_COMMUNITY): Payer: Self-pay | Admitting: Cardiology

## 2022-08-29 ENCOUNTER — Telehealth: Payer: Self-pay

## 2022-08-29 NOTE — Telephone Encounter (Signed)
  Loop Recorder Follow up   Is patient connected to [facility]? Yes   Have steri-strips fallen off or been removed? No   Does the patient need in office follow up? Yes   Please continue to monitor your cardiac device site for redness, swelling, and drainage. Call the device clinic at [phone] if you experience these symptoms, fever/chills, or have questions about your device.   Remote monitoring is used to monitor your cardiac device from home. This monitoring is scheduled every month by our office. It allows us to keep an eye on the functioning of your device to ensure it is working properly.  

## 2022-08-29 NOTE — Telephone Encounter (Signed)
-----   Message from Sheilah Pigeon, [place] sent at 08/26/2022  4:10 PM EDT ----- Loop implant CL [place] Recurrent dizzy spells, SCT

## 2022-08-31 NOTE — Progress Notes (Signed)
Cardiology Office Note:  .   Date:  09/02/2022  ID:  Anne Bradshaw, DOB 1927/08/23, MRN [id number] PCP: Irven Coe, MD  [facility] Providers Cardiologist:  Verne Carrow, MD    Patient Profile: .      PMH: SVT Pre-syncope/Syncope Sinus bradycardia GERD Hypertension  Referred to cardiology and seen by Dr. Clifton James on 09/16/2021 for evaluation of dyspnea.  No prior cardiac testing reported.  Seen in ED 08/25/2021 with dyspnea and nausea. CXR normal. No ischemic changes on EKG, no chest pain, troponin negative. Dyspnea had resolved at time of appointment.  Echocardiogram ordered to evaluate soft systolic murmur and assess LVEF. 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 Advised to follow-up as needed.  Admission 7/3-08/26/22 for dizziness, seen by cardiology for evaluation of supraventricular tachycardia. She reported numerous dizzy spells since January.  Feels like her head is "swimming." Often has to lean against door frames and wall. On day of admission, she experienced an acute episode and was unable to hold herself up. She sustained a fall on her left hip and hit her head but did not lose consciousness. Was found to be in SVT upon EMS arrival with heart rates in the 170s.  Valsalva maneuvers attempted but were unsuccessful.  She converted to sinus rhythm after 6 mg of adenosine.  Head CT and CXR with no significant findings.  Carvedilol was started by general cardiology. She was seen by Dr. Lalla Brothers, EP cardiologist, and given her advanced age, recommendation to start medical therapy.  Amiodarone 400 mg twice daily x 5 days was added with plan to decrease to 200 mg daily. Monitoring telemetry for next 24-48 hours.  Carvedilol was discontinued due to risk of bradycardia.  Telemetry revealed no recurrence of SVT. Loop recorder was inserted on 08/26/2022. BP remained  stable. She was discharged home 08/26/22.        History of Present Illness: Marland Kitchen   Anne Bradshaw is a very pleasant 89 y.o. female who is here today with her husband for hospital follow-up.  Reports she is doing well.  Is feeling a little bit of soreness from her hospitalization in the fall but otherwise no concerns. She and her husband live at home with a daughter who lives nearby.  She remains active doing housework.  She has had no falls, no dizziness, presyncope, or syncope since hospital discharge.  She denies chest pain, shortness of breath, orthopnea, PND, edema. Reports prior to admission, she had multiple episodes of dizziness and "feeling of electrical current in my head" that usually resolved with putting her head down and resting for a few minutes. Episode that occurred prior to admission was worse event and caused her to suffer her first fall.   ROS: See HPI       Studies Reviewed: Marland Kitchen   EKG Interpretation Date/Time:  Friday September 02 2022 08:57:20 EDT Ventricular Rate:  52 PR Interval:  170 QRS Duration:  86 QT Interval:  472 QTC Calculation: 438 R Axis:   -48  Text Interpretation: Sinus bradycardia Left axis deviation When compared with ECG of 24-Aug-2022 11:57, PREVIOUS ECG IS PRESENT Confirmed by Eligha Bridegroom [phone]) on 09/02/2022 9:03:56 AM     Risk Assessment/Calculations:     HYPERTENSION CONTROL Vitals:   09/02/22 0854 09/02/22 0918  BP: (!) 148/84 (!) 140/70    The patient's blood pressure is elevated above target today.  In order to  address the patient's elevated BP: Blood pressure will be monitored at home to determine if medication changes need to be made.          Physical Exam:   VS:  BP (!) 140/70   Pulse (!) 52   Ht 5\' 4"  (1.626 m)   Wt 114 lb (51.7 kg)   SpO2 96%   BMI 19.57 kg/m    Wt Readings from Last 3 Encounters:  09/02/22 114 lb (51.7 kg)  08/24/22 111 lb 12.4 oz (50.7 kg)  09/16/21 119 lb 12.8 oz (54.3 kg)    GEN: Well nourished,  well developed in no acute distress NECK: No JVD; No carotid bruits CARDIAC: RRR, no murmurs, rubs, gallops RESPIRATORY:  Clear to auscultation without rales, wheezing or rhonchi  ABDOMEN: Soft, non-tender, non-distended EXTREMITIES:  No edema; No deformity     ASSESSMENT AND PLAN: .    Presyncope: She has had no further episodes of dizziness, presyncope, syncope since hospital discharge. Symptoms felt to be exacerbated by SVT. She is taking precautions at home to avoid falls. Will allow for slightly elevated BP in the setting of age/fall risk.   SVT: Admission 7/3-08/26/22 with SVT/presyncope. Found to have SVT with HR 170s.  Initial management with beta-blocker, however it was discontinued due to high risk of bradycardia.  She was started on amiodarone by EP service. ILR implanted 08/26/22 by Dr. Lalla Brothers. No palpitations or feelings of chest discomfort. No tachycardia to her awareness. EKG reveals sinus bradycardia at 52 bpm today. QTc is stable on amiodarone. She has follow-up scheduled with EP on 09/19/22. Advised her to call with questions prior to follow-up appointment.   Sinus bradycardia: EKG today reveals sinus bradycardia at 52 bpm. She is not on AV nodal blockers. She is asymptomatic.  Encouraged her to notify us if she develops symptoms of lightheadedness, presyncope, syncope, fatigue, or other concerns prior to next appointment.  Hypertension: BP is mildly elevated.  We will allow for mild hypertension in the setting of bradycardia, advanced age, and fall risk.  Advised her to notify us if SBP is consistently > 160 mmHg.   Fall risk: She reports no rugs in her home to reduce chance of fall. She is using a rolling walker and a cane for assistance. No episodes of lightheadedness, presyncope, syncope since hospital discharge.   DNR: She asked that we complete a DNR for her to post at her home.  Document was read out loud to patient and her husband and she gave verbal agreement. We will update  medical records.        Dispo: Keep your July appointment with Francis Dowse, PA   Signed, Eligha Bridegroom, NP-C

## 2022-09-02 ENCOUNTER — Ambulatory Visit: Payer: Medicare Other | Attending: Nurse Practitioner | Admitting: Nurse Practitioner

## 2022-09-02 ENCOUNTER — Encounter: Payer: Self-pay | Admitting: Nurse Practitioner

## 2022-09-02 VITALS — BP 140/70 | HR 52 | Ht 64.0 in | Wt 114.0 lb

## 2022-09-02 DIAGNOSIS — R42 Dizziness and giddiness: Secondary | ICD-10-CM

## 2022-09-02 DIAGNOSIS — R001 Bradycardia, unspecified: Secondary | ICD-10-CM

## 2022-09-02 DIAGNOSIS — Z9181 History of falling: Secondary | ICD-10-CM

## 2022-09-02 DIAGNOSIS — I471 Supraventricular tachycardia, unspecified: Secondary | ICD-10-CM | POA: Diagnosis present

## 2022-09-02 DIAGNOSIS — R55 Syncope and collapse: Secondary | ICD-10-CM

## 2022-09-02 DIAGNOSIS — Z7189 Other specified counseling: Secondary | ICD-10-CM | POA: Diagnosis not present

## 2022-09-02 DIAGNOSIS — I10 Essential (primary) hypertension: Secondary | ICD-10-CM | POA: Diagnosis present

## 2022-09-02 NOTE — Patient Instructions (Addendum)
Medication Instructions:  Your physician recommends that you continue on your current medications as directed. Please refer to the Current Medication list given to you today.  *If you need a refill on your cardiac medications before your next appointment, please call your pharmacy*   Lab Work: None  If you have labs (blood work) drawn today and your tests are completely normal, you will receive your results only by: MyChart Message (if you have MyChart) OR A paper copy in the mail If you have any lab test that is abnormal or we need to change your treatment, we will call you to review the results.   Testing/Procedures: None   Follow-Up: At [facility], you and your health needs are our priority.  As part of our continuing mission to provide you with exceptional heart care, we have created designated Provider Care Teams.  These Care Teams include your primary Cardiologist (physician) and Advanced Practice Providers (APPs -  Physician Assistants and Nurse Practitioners) who all work together to provide you with the care you need, when you need it.  We recommend signing up for the patient portal called "MyChart".  Sign up information is provided on this After Visit Summary.  MyChart is used to connect with patients for Virtual Visits (Telemedicine).  Patients are able to view lab/test results, encounter notes, upcoming appointments, etc.  Non-urgent messages can be sent to your provider as well.   To learn more about what you can do with MyChart, go to [email or web address].    Your next appointment:   1 month(s)  Provider:   You will see one of the  Advanced Practice Providers on your designated Care Team:   Francis Dowse, [place]

## 2022-09-05 ENCOUNTER — Other Ambulatory Visit (HOSPITAL_COMMUNITY): Payer: Self-pay

## 2022-09-08 ENCOUNTER — Telehealth: Payer: Self-pay | Admitting: Cardiovascular Disease

## 2022-09-08 MED ORDER — AMLODIPINE BESYLATE 5 MG PO TABS: 5.0000 mg | ORAL_TABLET | Freq: Every day | ORAL | 0 refills | Status: DC

## 2022-09-08 NOTE — Telephone Encounter (Signed)
Late entry:  I called the pt back about 2 hours ago and her BP cuff kept inflating and deflating.. I asked her to be sure she was sitting with both feet on the ground and take the cuff off for now since it did not seem to be working well and it could affect her BP reading.   I told her I will call her back and see if she could change the batteries.   Current call tot he pt:   She had changed the batteries and her BP is now imrpoved some: 168/76 and HR 60,   She says she is much more relaxed and feeling better.   She will check it again in a couple of hours and will then follow up with her. She will start keeping a log of her readings.

## 2022-09-08 NOTE — Telephone Encounter (Signed)
I spoke with the pt and she reports that she checked her BP this morning and it was high but she got very anxious and it continued to rise... she denies headache, dizziness, chest pain... she is just very worried.   Her readings:   156/94 177/82 200/91  I advised her to rest and try to relax for a bit, to eat her breakfast and I will call her soon to have her then recheck it after she is feeling a little less worried and she agreed.   I did explain to her that of she develops any symptoms to go ahead and call EMS and she agreed.

## 2022-09-08 NOTE — Telephone Encounter (Signed)
Pt c/o BP issue: STAT if pt c/o blurred vision, one-sided weakness or slurred speech  1. What are your last 5 BP readings?  156/94 177/82 200/91   2. Are you having any other symptoms (ex. Dizziness, headache, blurred vision, passed out)? Headache   3. What is your BP issue? Patient states that she was told to call if her BP reading went up. States that she was taken off of her BP medication in hospital. Requesting return call to discuss.

## 2022-09-08 NOTE — Telephone Encounter (Addendum)
I checked on the pt... and her BP got down to 155/69, 157/73...but it jumped up to 175/77 again... she says he feels well but still anxious... I advised her to rest and keep monitoring and I will talk with the DOD.

## 2022-09-08 NOTE — Telephone Encounter (Signed)
Per the DOD Dr Ladona Ridgel... pt to take Amlodipine 5 mg which she has taken in the past tonight and tomorrow morning and to come in for a nurse BP check tomorrow at 2 pm and to bring her cuff with her.

## 2022-09-09 ENCOUNTER — Ambulatory Visit: Payer: Medicare Other | Attending: Cardiology

## 2022-09-09 VITALS — BP 190/74 | HR 56 | Ht 64.0 in | Wt 112.0 lb

## 2022-09-09 DIAGNOSIS — I10 Essential (primary) hypertension: Secondary | ICD-10-CM | POA: Diagnosis present

## 2022-09-09 MED ORDER — AMLODIPINE BESYLATE 5 MG PO TABS
10.0000 mg | ORAL_TABLET | Freq: Every day | ORAL | 1 refills | Status: DC
Start: 2022-09-09 — End: 2022-09-14

## 2022-09-09 NOTE — Patient Instructions (Addendum)
Medication Instructions:  Your physician has recommended you make the following change in your medication:  Take an extra amlodpine 5 mg when you get home, then tomorrow start taking amlodipine 10 mg (2 tablets) by mouth daily.  *If you need a refill on your cardiac medications before your next appointment, please call your pharmacy*  Lab Work: If you have labs (blood work) drawn today and your tests are completely normal, you will receive your results only by: MyChart Message (if you have MyChart) OR A paper copy in the mail If you have any lab test that is abnormal or we need to change your treatment, we will call you to review the results.  Follow-Up: At [facility], you and your health needs are our priority.  As part of our continuing mission to provide you with exceptional heart care, we have created designated Provider Care Teams.  These Care Teams include your primary Cardiologist (physician) and Advanced Practice Providers (APPs -  Physician Assistants and Nurse Practitioners) who all work together to provide you with the care you need, when you need it.  We recommend signing up for the patient portal called "MyChart".  Sign up information is provided on this After Visit Summary.  MyChart is used to connect with patients for Virtual Visits (Telemedicine).  Patients are able to view lab/test results, encounter notes, upcoming appointments, etc.  Non-urgent messages can be sent to your provider as well.   To learn more about what you can do with MyChart, go to [email or web address].    Your next appointment:   Keep follow-up with   Provider:   Francis Dowse, PA   Other Instructions Check blood pressure twice daily  over the weekend and call on Monday to report blood pressure.  If you systolic blood pressure, the top number is over 180 over the weekend give our on call doctor a call.

## 2022-09-09 NOTE — Progress Notes (Addendum)
   Nurse Visit   Date of Encounter: 09/09/2022 ID: Anne Bradshaw, DOB January 12, 1928, MRN [id number]  PCP:  Irven Coe, MD   [facility] Providers Cardiologist:  Verne Carrow, MD      Visit Details   VS:  BP (!) 190/74 (BP Location: Right Arm, Patient Position: Sitting, Cuff Size: Normal) Comment: home BP machine 190/79  Pulse (!) 56   Ht 5\' 4"  (1.626 m)   Wt 112 lb (50.8 kg)   SpO2 95%   BMI 19.22 kg/m  , BMI Body mass index is 19.22 kg/m.  Wt Readings from Last 3 Encounters:  09/09/22 112 lb (50.8 kg)  09/02/22 114 lb (51.7 kg)  08/24/22 111 lb 12.4 oz (50.7 kg)    Reason for visit: BP check Performed today: Vitals, EKG, Provider consulted:Dr. Mayford Knife, and Education Changes (medications, testing, etc.) : Increased Amlodipine 10 mg by mouth daily and check BPs twice daily Length of Visit: 30 minutes  Medications Adjustments/Labs and Tests Ordered: Patient is to take an extra amlodipine 5 mg when she gets home today. Then, Patient will start amlodipine 10 mg by mouth daily. Patient is suppose to take her BP twice daily over the weekend and call our office on Monday with BPs. Patient is to call our office to the on call MD if her SBP is above 180.   Patient also had concerns about her amiodarone 200 mg by mouth daily, and she stated that she has been feeling weak ever since she started it. DOD, Dr. Mayford Knife, advised to send message to Francis Dowse PA for advisement on amiodarone.  Signed, Ethelda Chick, RN  09/09/2022 2:27 PM

## 2022-09-09 NOTE — Addendum Note (Signed)
Addended by: Virl Axe, Deniz Hannan L on: 09/09/2022 04:45 PM   Modules accepted: Level of Service

## 2022-09-12 ENCOUNTER — Telehealth: Payer: Self-pay | Admitting: Cardiovascular Disease

## 2022-09-12 NOTE — Telephone Encounter (Signed)
Spoke w patient and adv per Dr. Clifton James continue on amlodipine 10 mg daily and continue monitoring BP several hours after medication 1-2 x/day.  Advised to call back on Friday morning with update.  Pt voices understanding ----------------------------------------------------------------------------  hristopher D, MD  You5 hours ago (12:30 PM)    Continue to follow after recent medication addition. Thayer Ohm

## 2022-09-12 NOTE — Telephone Encounter (Signed)
Patient is calling to follow up on her phone call from this morning.

## 2022-09-12 NOTE — Telephone Encounter (Signed)
Pt c/o BP issue: STAT if pt c/o blurred vision, one-sided weakness or slurred speech  1. What are your last 5 BP readings?  172/70 hr 65 - this morning  150/70 - last Friday   2. Are you having any other symptoms (ex. Dizziness, headache, blurred vision, passed out)? No   3. What is your BP issue? Pt calling to update nurse about BP after her nurse visit last week. She states she is feeling better and gave some BP readings above. Please advise.

## 2022-09-14 ENCOUNTER — Telehealth: Payer: Self-pay | Admitting: Cardiovascular Disease

## 2022-09-14 MED ORDER — AMLODIPINE BESYLATE 10 MG PO TABS: 10.0000 mg | ORAL_TABLET | Freq: Every day | ORAL | 0 refills | Status: DC

## 2022-09-14 NOTE — Telephone Encounter (Signed)
Pt calling in about her Amlodipine. She would like a 10 mg Rx sent in, she thought RN was doing this yesterday. Pt aware Rx sent, she appreciates the help.

## 2022-09-14 NOTE — Progress Notes (Unsigned)
Electrophysiology Office Note:   Date:  09/19/2022  ID:  TENELLE ANDREASON, DOB 03/31/1927, MRN [id number]  Primary Cardiologist: Verne Carrow, MD Electrophysiologist: Dr. Lalla Brothers     History of Present Illness:   Anne Bradshaw is a 89 y.o. female with h/o GERD, HTN, SVT and recurrent dizzy spells seen 7/29 for routine electrophysiology followup.   The patient was hospitalized from 7/3-08/26/2022 for dizziness, near syncope and SVT (170's). She required adenosine 6mg  per EMS which converted her back to NSR.  Home beta blockers were held and amiodarone was initiated.  Her blood pressure remained stable while inpatient off beta blocker. Telemetry monitoring showed SB/SR with no SVT. She had an implantable loop recorder placed on 7/5 for rhythm surveillance. She followed up in Cardiology Clinic 09/02/22 and denied further symptoms of dizziness/pre-syncope. Her blood pressure was mildly elevated at that time with rec's to notify clinic if SBP consistently >160.   HR at that time on EKG was SB at 52. At that visit, she filled out a DNR form.    Since last being seen in our clinic the patient reports she has been dizzy with position changes - mostly when she bends down to tie her shoes. She denies dizziness at prior hospital visit, just that she fell and had a fast HR.  She noted dizziness & LE swelling when amlodipine was added. Reports drinking at least 3 glasses of water per day.  They typically do no eat breakfast as they sleep in late.   she denies chest pain, palpitations, dyspnea, PND, orthopnea, nausea, vomiting, dizziness, syncope, edema, weight gain, or early satiety.   Review of systems complete and found to be negative unless listed in HPI.    EP Information / Studies Reviewed:    EKG is ordered today. Personal review as below.  EKG Interpretation Date/Time:  Monday September 19 2022 08:44:52 EDT Ventricular Rate:  62 PR Interval:  158 QRS Duration:  80 QT Interval:  422 QTC  Calculation: 428 R Axis:   -37  Text Interpretation: Normal sinus rhythm Left axis deviation When compared with ECG of 02-Sep-2022 08:57, No significant change was found Confirmed by Canary Brim ([id number]) on 09/19/2022 8:50:56 AM   Studies:  ECHO 08/25/2022 LVEF 60-65%, no RWMA, RV systolic function normal, trivial MVR, no evidence of stenosis, mild calcification of aortic valve  Device History: Water quality scientist implanted 08/26/2022 for  syncope and SVT  Risk Assessment/Calculations:      Orthostatic VS for the past 24 hrs:  BP- Lying Pulse- Lying BP- Sitting Pulse- Sitting BP- Standing at 0 minutes Pulse- Standing at 0 minutes  09/19/22 0900 163/69 61 163/69 63 163/64 68      Physical Exam:   VS:  Ht 5\' 4"  (1.626 m)   Wt 111 lb 3.2 oz (50.4 kg)   SpO2 97%   BMI 19.09 kg/m    Wt Readings from Last 3 Encounters:  09/19/22 111 lb 3.2 oz (50.4 kg)  09/09/22 112 lb (50.8 kg)  09/02/22 114 lb (51.7 kg)     GEN: Well nourished, well developed in no acute distress NECK: No JVD; No carotid bruits CARDIAC: Regular rate and rhythm, no murmurs, rubs, gallops RESPIRATORY:  Clear to auscultation without rales, wheezing or rhonchi  ABDOMEN: Soft, non-tender, non-distended EXTREMITIES:  Trace to 1+ LE edema; No deformity   ASSESSMENT AND PLAN:    Implantable Loop Recorder  s/p [employer] PPM  Loop implanted 08/26/22 after episodes of SVT 170's.  -  loop recorder reviewed, see interrogation  -no tachy or significantly brady events noted to explain dizziness   Syncope  SVT  -continue amiodarone  -continue to hold beta blocker  -check amio labs > TSH, CMP  Dizziness -pt clear that dizziness started after amlodipine (+ swelling) -not orthostatic in clinic -encouraged adequate hydration  -TED hose recommened  HTN  LE Swelling -beta blocker held at last admission 08/2022 -BP largely 140's systolic but does range 150's up to 190 & LE swelling and dizziness with  amlodipine -reduce amlodipine to 5mg  every day, may have to transition off if continues to be dizzy  -add losartan 25mg  QD -ASA 81 mg every day   Disposition:   Follow up with EP APP in 6 months for amiodarone labs.  Follow up with RN in clinic in 1 month for BP check, 4 months with Cardiology for BP review / med adjustment as indicated.   Signed, Canary Brim, MSN, APRN, NP-C, AGACNP-BC [facility] - Electrophysiology  09/19/2022, 9:45 AM

## 2022-09-14 NOTE — Telephone Encounter (Signed)
Patient called to talk with Dr. Clifton James or nurse in regards to the medication amiodarone (PACERONE) 200 MG tablet

## 2022-09-16 NOTE — Telephone Encounter (Signed)
Patient reports that she is feeling good. Blood pressures have been running 140s/60s.   See previous phone note from 7/22. She was told to call back this morning with readings.

## 2022-09-16 NOTE — Telephone Encounter (Signed)
Pt calling to give update on how she is doing with the medication

## 2022-09-16 NOTE — Telephone Encounter (Signed)
Attempted to call patient but call will not go through. Will try again later.

## 2022-09-19 ENCOUNTER — Ambulatory Visit: Payer: Medicare Other | Attending: Physician Assistant | Admitting: Pulmonary Disease

## 2022-09-19 ENCOUNTER — Encounter: Payer: Self-pay | Admitting: Physician Assistant

## 2022-09-19 VITALS — Ht 64.0 in | Wt 111.2 lb

## 2022-09-19 DIAGNOSIS — R55 Syncope and collapse: Secondary | ICD-10-CM | POA: Insufficient documentation

## 2022-09-19 DIAGNOSIS — R42 Dizziness and giddiness: Secondary | ICD-10-CM | POA: Insufficient documentation

## 2022-09-19 DIAGNOSIS — I471 Supraventricular tachycardia, unspecified: Secondary | ICD-10-CM | POA: Diagnosis present

## 2022-09-19 DIAGNOSIS — I10 Essential (primary) hypertension: Secondary | ICD-10-CM | POA: Diagnosis present

## 2022-09-19 MED ORDER — LOSARTAN POTASSIUM 25 MG PO TABS: 25.0000 mg | ORAL_TABLET | Freq: Every day | ORAL | 3 refills | Status: DC

## 2022-09-19 MED ORDER — AMLODIPINE BESYLATE 5 MG PO TABS: 5.0000 mg | ORAL_TABLET | Freq: Every day | ORAL | 3 refills | Status: DC

## 2022-09-19 NOTE — Patient Instructions (Addendum)
Medication Instructions:  START Losartan 25mg  Take 1 tablet once a day  DECREASE Norvasc 5mg  Take 1 tablet once a day  *If you need a refill on your cardiac medications before your next appointment, please call your pharmacy*   Lab Work: TODAY-TSH & CMET If you have labs (blood work) drawn today and your tests are completely normal, you will receive your results only by: MyChart Message (if you have MyChart) OR A paper copy in the mail If you have any lab test that is abnormal or we need to change your treatment, we will call you to review the results.   Testing/Procedures: NONE ORDERED   Follow-Up: At [facility], you and your health needs are our priority.  As part of our continuing mission to provide you with exceptional heart care, we have created designated Provider Care Teams.  These Care Teams include your primary Cardiologist (physician) and Advanced Practice Providers (APPs -  Physician Assistants and Nurse Practitioners) who all work together to provide you with the care you need, when you need it.  We recommend signing up for the patient portal called "MyChart".  Sign up information is provided on this After Visit Summary.  MyChart is used to connect with patients for Virtual Visits (Telemedicine).  Patients are able to view lab/test results, encounter notes, upcoming appointments, etc.  Non-urgent messages can be sent to your provider as well.   To learn more about what you can do with MyChart, go to [email or web address].    Your next appointment:   4 month(s)  Provider:   Verne Carrow, MD    1 MONTH NURSE VISIT (BP CHECK-PLEASE BRING YOUR BLOOD PRESSURE CUFF TO THE APPOINTMENT) 6 MONTHS WITH EP (RENEE URSUY, PA)   Other Instructions GET SOME TED HOSE AND WEAR DURING THE DAY AND REMOVE AT NIGHT CHANGE POSITIONS SLOWLY DRINK PLENTY OF FLUIDS DAILY

## 2022-09-20 ENCOUNTER — Other Ambulatory Visit: Payer: Self-pay | Admitting: *Deleted

## 2022-09-20 DIAGNOSIS — Z79899 Other long term (current) drug therapy: Secondary | ICD-10-CM

## 2022-09-26 ENCOUNTER — Telehealth: Payer: Self-pay

## 2022-09-26 NOTE — Telephone Encounter (Signed)
Following alert received from [employer] received for tachy episode that was 3 minutes and 52 seconds and was 175 bpm.  Patient reports there was on day she experienced palpations but unable to recall what day that was. Denies dizziness, lightheadedness or syncope. Patient aware to call if any symptoms arise and advised I anticipate no changes at this time. Patient appreciative of call.

## 2022-10-03 ENCOUNTER — Ambulatory Visit: Payer: Medicare Other | Attending: Cardiovascular Disease

## 2022-10-03 ENCOUNTER — Ambulatory Visit: Payer: Medicare Other

## 2022-10-03 VITALS — BP 150/66 | HR 63 | Ht 64.0 in | Wt 112.8 lb

## 2022-10-03 DIAGNOSIS — R001 Bradycardia, unspecified: Secondary | ICD-10-CM

## 2022-10-03 DIAGNOSIS — I10 Essential (primary) hypertension: Secondary | ICD-10-CM | POA: Diagnosis present

## 2022-10-03 LAB — CUP PACEART REMOTE DEVICE CHECK
Date Time Interrogation Session: 20240812002100
Implantable Pulse Generator Implant Date: 20240708
Pulse Gen Serial Number: 107973

## 2022-10-03 MED ORDER — LOSARTAN POTASSIUM 50 MG PO TABS: 50.0000 mg | ORAL_TABLET | Freq: Every day | ORAL | 3 refills | Status: DC

## 2022-10-03 MED ORDER — AMIODARONE HCL 200 MG PO TABS: 200.0000 mg | ORAL_TABLET | Freq: Every day | ORAL | 3 refills | Status: DC

## 2022-10-03 NOTE — Patient Instructions (Incomplete)
Medication Instructions:  Your physician has recommended you make the following change in your medication:   Increase losartan to 50 mg daily  *If you need a refill on your cardiac medications before your next appointment, please call your pharmacy*   Lab Work: BMET, TSH and T4 on 8/30  If you have labs (blood work) drawn today and your tests are completely normal, you will receive your results only by: MyChart Message (if you have MyChart) OR A paper copy in the mail If you have any lab test that is abnormal or we need to change your treatment, we will call you to review the results.   Follow-Up: At [facility], you and your health needs are our priority.  As part of our continuing mission to provide you with exceptional heart care, we have created designated Provider Care Teams.  These Care Teams include your primary Cardiologist (physician) and Advanced Practice Providers (APPs -  Physician Assistants and Nurse Practitioners) who all work together to provide you with the care you need, when you need it.  We recommend signing up for the patient portal called "MyChart".  Sign up information is provided on this After Visit Summary.  MyChart is used to connect with patients for Virtual Visits (Telemedicine).  Patients are able to view lab/test results, encounter notes, upcoming appointments, etc.  Non-urgent messages can be sent to your provider as well.   To learn more about what you can do with MyChart, go to [email or web address].    Your next appointment:   10/21/22  Provider:   Jari Favre, PA-C     {Select if MD will see after APP     :[id number]} {If Card or EP not listed click to update   DO NOT delete brackets or number around this link :1}   Other Instructions ***

## 2022-10-03 NOTE — Progress Notes (Unsigned)
   Nurse Visit   Date of Encounter: 10/03/2022 ID: CAIT REDDIG, DOB 02-07-1928, MRN [id number]  PCP:  Irven Coe, MD   [facility] Providers Cardiologist:  Verne Carrow, MD { Click to update primary MD,subspecialty MD or APP then REFRESH:1}     Visit Details   VS:  BP (!) 150/66 (BP Location: Right Arm, Patient Position: Sitting)   Pulse 63   Ht 5\' 4"  (1.626 m)   Wt 112 lb 12.8 oz (51.2 kg)   SpO2 97%   BMI 19.36 kg/m  , BMI Body mass index is 19.36 kg/m.  Wt Readings from Last 3 Encounters:  10/03/22 112 lb 12.8 oz (51.2 kg)  09/19/22 111 lb 3.2 oz (50.4 kg)  09/09/22 112 lb (50.8 kg)     Reason for visit: BP check  Performed today: , Vitals, Provider consulted:  Dr Elease Hashimoto (DOD) , and Education Changes (medications, testing, etc.) : Losartan increased to 50 mg daily  Length of Visit: 30 minutes 5.   Patient needed refill for amiodarone sent to pharmacy, she only had supply sent home with her from the hospital.  6.    Patient will return in 2 weeks for BMET and appointment with APP.   Medications Adjustments/Labs and Tests Ordered: Orders Placed This Encounter  Procedures   Basic metabolic panel   Meds ordered this encounter  Medications   losartan (COZAAR) 50 MG tablet    Sig: Take 1 tablet (50 mg total) by mouth daily.    Dispense:  90 tablet    Refill:  3   amiodarone (PACERONE) 200 MG tablet    Sig: Take 1 tablet (200 mg total) by mouth daily.    Dispense:  90 tablet    Refill:  3     Signed, TYJANAE CARRERAS, RN  10/03/2022 3:55 PM

## 2022-10-17 NOTE — Progress Notes (Signed)
[place] Loop Recorder 

## 2022-10-20 NOTE — Progress Notes (Signed)
Cardiology Office Note:  .   Date:  10/21/2022  ID:  Anne Bradshaw, DOB 07-28-1927, MRN [id number] PCP: Irven Coe, MD  [facility] Providers Cardiologist:  Anne Carrow, MD {   History of Present Illness: Anne Bradshaw   Anne Bradshaw is a 89 y.o. female with a past medical history of GERD, HTN, SVT and recurrent dizzy spells who is here for routine follow-up appointment.  Patient was hospitalized 7/3 through 08/26/2022 for dizziness, near-syncope NSVT (170s).  Required adenosine 6 mg per EMS which converted back to normal sinus rhythm.  Home beta-blockers were held and amiodarone was initiated.  Blood pressure remained stable, patient off beta-blocker.  Telemetry monitor showed SB/SR with no SVT.  Had implantable loop recorder placed 5/7.  Followed up in the clinic 7/12 and denied further symptoms of dizziness/presyncope.  Blood pressure was mildly elevated at that time.  Heart rate on EKG was SB at 52.  She was last seen 7/29 and at that time she was having dizziness with positional changes mostly when she bent down to tie her shoes and then stood up.  She denied dizziness prior to hospital visit.  She noted dizziness and lower extremity swelling when amlodipine was added.  Reported drinking at least 3 glasses of water per day.  Typically does not eat any breakfast.  At that visit denied chest pain, palpitations, dyspnea, PND, orthopnea, nausea, vomiting, dizziness, syncope, edema, weight gain, and early satiety.  Today, she tells me she was originally on 10 mg of amlodipine and had lots of swelling on the bottom of her feet.  It was since reduced to 5 mg and she is still having swelling issues.  We discussed several medication change options.  Ultimately, decided to increase her losartan to 75 and discontinue amlodipine altogether.  Encouraged to have her track blood pressure at home 2 hours after medication.  She had syncope right before she went to the hospital and had an electric noise  sensation.  She ultimately hit her left hip, elbow, and head.  Had a full workup in the ER with no injuries sustained and head CT normal luckily.  No further episodes.  Reports no shortness of breath nor dyspnea on exertion. Reports no chest pain, pressure, or tightness. No edema, orthopnea, PND. Reports no palpitations.   ROS: Pertinent ROS in HPI  Studies Reviewed: Anne Bradshaw        ECHO 08/25/2022 LVEF 60-65%, no RWMA, RV systolic function normal, trivial MVR, no evidence of stenosis, mild calcification of aortic valve        Physical Exam:   VS:  BP (!) 142/76   Pulse 60   Ht 5\' 4"  (1.626 m)   Wt 115 lb 12.8 oz (52.5 kg)   SpO2 94%   BMI 19.88 kg/m    Wt Readings from Last 3 Encounters:  10/21/22 115 lb 12.8 oz (52.5 kg)  10/03/22 112 lb 12.8 oz (51.2 kg)  09/19/22 111 lb 3.2 oz (50.4 kg)    GEN: Well nourished, well developed in no acute distress NECK: No JVD; No carotid bruits CARDIAC: RRR, no murmurs, rubs, gallops RESPIRATORY:  Clear to auscultation without rales, wheezing or rhonchi  ABDOMEN: Soft, non-tender, non-distended EXTREMITIES:  No edema; No deformity   ASSESSMENT AND PLAN: .   1.  Syncope/SVT -No further syncopal episodes or racing heartbeat -Would continue current medicines including amiodarone 200 mg daily  2.  Dizziness -better than it was -bp has been well controlled, she brought  in a log today and most in the 130s-140s systolic -Would continue to monitor blood pressure at home -Would continue low-sodium, heart healthy diet  3.  HTN -Blood pressure well-controlled today 142/76 -Due to her age and history blood pressure goal would be 130s to 140s systolic -She would like to discontinue amlodipine today due to lower extremity swelling (bottom of her feet) -We have increased her losartan to 75 mg daily  4.  Lower extremity edema -Stop amlodipine, increase losartan -Elevate feet when able and maintain a heart healthy, low-sodium diet  5.  Implantable loop  recorder   -Remote check scheduled    Dispo: She can follow-up in 5 to 6 months with Dr. Clifton James  Signed, Sharlene Dory, PA-C

## 2022-10-21 ENCOUNTER — Ambulatory Visit (INDEPENDENT_AMBULATORY_CARE_PROVIDER_SITE_OTHER): Payer: Medicare Other | Admitting: Physician Assistant

## 2022-10-21 ENCOUNTER — Telehealth: Payer: Self-pay | Admitting: Cardiovascular Disease

## 2022-10-21 ENCOUNTER — Ambulatory Visit: Payer: Medicare Other | Attending: Physician Assistant

## 2022-10-21 ENCOUNTER — Encounter: Payer: Self-pay | Admitting: Physician Assistant

## 2022-10-21 VITALS — BP 142/76 | HR 60 | Ht 64.0 in | Wt 115.8 lb

## 2022-10-21 DIAGNOSIS — I10 Essential (primary) hypertension: Secondary | ICD-10-CM

## 2022-10-21 DIAGNOSIS — R0602 Shortness of breath: Secondary | ICD-10-CM | POA: Diagnosis present

## 2022-10-21 DIAGNOSIS — R42 Dizziness and giddiness: Secondary | ICD-10-CM

## 2022-10-21 DIAGNOSIS — I471 Supraventricular tachycardia, unspecified: Secondary | ICD-10-CM | POA: Insufficient documentation

## 2022-10-21 DIAGNOSIS — Z9889 Other specified postprocedural states: Secondary | ICD-10-CM

## 2022-10-21 DIAGNOSIS — Z79899 Other long term (current) drug therapy: Secondary | ICD-10-CM

## 2022-10-21 DIAGNOSIS — R001 Bradycardia, unspecified: Secondary | ICD-10-CM | POA: Diagnosis present

## 2022-10-21 DIAGNOSIS — R55 Syncope and collapse: Secondary | ICD-10-CM | POA: Diagnosis present

## 2022-10-21 MED ORDER — LOSARTAN POTASSIUM 25 MG PO TABS: 25.0000 mg | ORAL_TABLET | Freq: Every day | ORAL | 3 refills | Status: DC

## 2022-10-21 NOTE — Telephone Encounter (Signed)
Patient reports she will not have enough Losartan 25 mg to last until her next refill. She is taking Losartan 25 mg and Losartan 50 mg to equal 75 mg daily.   Contacted [facility] to clarify dosage and verify they can fill the prescription.Follow up call to patient to advise pharmacy will be able to fill her medication order. Patient verbalized understanding and had no questions.

## 2022-10-21 NOTE — Patient Instructions (Signed)
Medication Instructions:  Stop norvasc Increase losartan to 75 mg daily this will be one of the 50 mg tablets in addition to one of the 25 mg tablets every day *If you need a refill on your cardiac medications before your next appointment, please call your pharmacy*  Lab Work: None ordered If you have labs (blood work) drawn today and your tests are completely normal, you will receive your results only by: MyChart Message (if you have MyChart) OR A paper copy in the mail If you have any lab test that is abnormal or we need to change your treatment, we will call you to review the results.  Follow-Up: At [facility], you and your health needs are our priority.  As part of our continuing mission to provide you with exceptional heart care, we have created designated Provider Care Teams.  These Care Teams include your primary Cardiologist (physician) and Advanced Practice Providers (APPs -  Physician Assistants and Nurse Practitioners) who all work together to provide you with the care you need, when you need it.  We recommend signing up for the patient portal called "MyChart".  Sign up information is provided on this After Visit Summary.  MyChart is used to connect with patients for Virtual Visits (Telemedicine).  Patients are able to view lab/test results, encounter notes, upcoming appointments, etc.  Non-urgent messages can be sent to your provider as well.   To learn more about what you can do with MyChart, go to [email or web address].    Your next appointment:   5-6 month(s)  Provider:   Verne Carrow, MD     Other Instructions Check your blood pressure daily, 2 hrs after morning medications for 2 weeks, keep a log and send [place] the readings through mychart at the end of the 2 weeks.    Low-Sodium Eating Plan Salt (sodium) helps you keep a healthy balance of fluids in your body. Too much sodium can raise your blood pressure. It can also cause fluid and waste to be held in  your body. Your health care provider or dietitian may recommend a low-sodium eating plan if you have high blood pressure (hypertension), kidney disease, liver disease, or heart failure. Eating less sodium can help lower your blood pressure and reduce swelling. It can also protect your heart, liver, and kidneys. What are tips for following this plan? Reading food labels  Check food labels for the amount of sodium per serving. If you eat more than one serving, you must multiply the listed amount by the number of servings. Choose foods with less than 140 milligrams (mg) of sodium per serving. Avoid foods with 300 mg of sodium or more per serving. Always check how much sodium is in a product, even if the label says "unsalted" or "no salt added." Shopping  Buy products labeled as "low-sodium" or "no salt added." Buy fresh foods. Avoid canned foods and pre-made or frozen meals. Avoid canned, cured, or processed meats. Buy breads that have less than 80 mg of sodium per slice. Cooking  Eat more home-cooked food. Try to eat less restaurant, buffet, and fast food. Try not to add salt when you cook. Use salt-free seasonings or herbs instead of table salt or sea salt. Check with your provider or pharmacist before using salt substitutes. Cook with plant-based oils, such as canola, sunflower, or olive oil. Meal planning When eating at a restaurant, ask if your food can be made with less salt or no salt. Avoid dishes labeled as brined, pickled,  cured, or smoked. Avoid dishes made with soy sauce, miso, or teriyaki sauce. Avoid foods that have monosodium glutamate (MSG) in them. MSG may be added to some restaurant food, sauces, soups, bouillon, and canned foods. Make meals that can be grilled, baked, poached, roasted, or steamed. These are often made with less sodium. General information Try to limit your sodium intake to 1,500-2,300 mg each day, or the amount told by your provider. What foods should I  eat? Fruits Fresh, frozen, or canned fruit. Fruit juice. Vegetables Fresh or frozen vegetables. "No salt added" canned vegetables. "No salt added" tomato sauce and paste. Low-sodium or reduced-sodium tomato and vegetable juice. Grains Low-sodium cereals, such as oats, puffed wheat and rice, and shredded wheat. Low-sodium crackers. Unsalted rice. Unsalted pasta. Low-sodium bread. Whole grain breads and whole grain pasta. Meats and other proteins Fresh or frozen meat, poultry, seafood, and fish. These should have no added salt. Low-sodium canned tuna and salmon. Unsalted nuts. Dried peas, beans, and lentils without added salt. Unsalted canned beans. Eggs. Unsalted nut butters. Dairy Milk. Soy milk. Cheese that is naturally low in sodium, such as ricotta cheese, fresh mozzarella, or Swiss cheese. Low-sodium or reduced-sodium cheese. Cream cheese. Yogurt. Seasonings and condiments Fresh and dried herbs and spices. Salt-free seasonings. Low-sodium mustard and ketchup. Sodium-free salad dressing. Sodium-free light mayonnaise. Fresh or refrigerated horseradish. Lemon juice. Vinegar. Other foods Homemade, reduced-sodium, or low-sodium soups. Unsalted popcorn and pretzels. Low-salt or salt-free chips. The items listed above may not be all the foods and drinks you can have. Talk to a dietitian to learn more. What foods should I avoid? Vegetables Sauerkraut, pickled vegetables, and relishes. Olives. [place] fries. Onion rings. Regular canned vegetables, except low-sodium or reduced-sodium items. Regular canned tomato sauce and paste. Regular tomato and vegetable juice. Frozen vegetables in sauces. Grains Instant hot cereals. Bread stuffing, pancake, and biscuit mixes. Croutons. Seasoned rice or pasta mixes. Noodle soup cups. Boxed or frozen macaroni and cheese. Regular salted crackers. Self-rising flour. Meats and other proteins Meat or fish that is salted, canned, smoked, spiced, or pickled. Precooked or  cured meat, such as sausages or meat loaves. Anne Bradshaw. Ham. Pepperoni. Hot dogs. Corned beef. Chipped beef. Salt pork. Jerky. Pickled herring, anchovies, and sardines. Regular canned tuna. Salted nuts. Dairy Processed cheese and cheese spreads. Hard cheeses. Cheese curds. Blue cheese. Feta cheese. String cheese. Regular cottage cheese. Buttermilk. Canned milk. Fats and oils Salted butter. Regular margarine. Ghee. Bacon fat. Seasonings and condiments Onion salt, garlic salt, seasoned salt, table salt, and sea salt. Canned and packaged gravies. Worcestershire sauce. Tartar sauce. Barbecue sauce. Teriyaki sauce. Soy sauce, including reduced-sodium soy sauce. Steak sauce. Fish sauce. Oyster sauce. Cocktail sauce. Horseradish that you find on the shelf. Regular ketchup and mustard. Meat flavorings and tenderizers. Bouillon cubes. Hot sauce. Pre-made or packaged marinades. Pre-made or packaged taco seasonings. Relishes. Regular salad dressings. Salsa. Other foods Salted popcorn and pretzels. Corn chips and puffs. Potato and tortilla chips. Canned or dried soups. Pizza. Frozen entrees and pot pies. The items listed above may not be all the foods and drinks you should avoid. Talk to a dietitian to learn more. This information is not intended to replace advice given to you by your health care provider. Make sure you discuss any questions you have with your health care provider. Document Revised: 02/24/2022 Document Reviewed: 02/24/2022 Elsevier Patient Education  2024 Elsevier Inc. Heart-Healthy Eating Plan Many factors influence your heart health, including eating and exercise habits. Heart health is also  called coronary health. Coronary risk increases with abnormal blood fat (lipid) levels. A heart-healthy eating plan includes limiting unhealthy fats, increasing healthy fats, limiting salt (sodium) intake, and making other diet and lifestyle changes. What is my plan? Your health care provider may recommend  that: You limit your fat intake to _________% or less of your total calories each day. You limit your saturated fat intake to _________% or less of your total calories each day. You limit the amount of cholesterol in your diet to less than _________ mg per day. You limit the amount of sodium in your diet to less than _________ mg per day. What are tips for following this plan? Cooking Cook foods using methods other than frying. Baking, boiling, grilling, and broiling are all good options. Other ways to reduce fat include: Removing the skin from poultry. Removing all visible fats from meats. Steaming vegetables in water or broth. Meal planning  At meals, imagine dividing your plate into fourths: Fill one-half of your plate with vegetables and green salads. Fill one-fourth of your plate with whole grains. Fill one-fourth of your plate with lean protein foods. Eat 2-4 cups of vegetables per day. One cup of vegetables equals 1 cup (91 g) broccoli or cauliflower florets, 2 medium carrots, 1 large bell pepper, 1 large sweet potato, 1 large tomato, 1 medium white potato, 2 cups (150 g) raw leafy greens. Eat 1-2 cups of fruit per day. One cup of fruit equals 1 small apple, 1 large banana, 1 cup (237 g) mixed fruit, 1 large orange,  cup (82 g) dried fruit, 1 cup (240 mL) 100% fruit juice. Eat more foods that contain soluble fiber. Examples include apples, broccoli, carrots, beans, peas, and barley. Aim to get 25-30 g of fiber per day. Increase your consumption of legumes, nuts, and seeds to 4-5 servings per week. One serving of dried beans or legumes equals  cup (90 g) cooked, 1 serving of nuts is  oz (12 almonds, 24 pistachios, or 7 walnut halves), and 1 serving of seeds equals  oz (8 g). Fats Choose healthy fats more often. Choose monounsaturated and polyunsaturated fats, such as olive and canola oils, avocado oil, flaxseeds, walnuts, almonds, and seeds. Eat more omega-3 fats. Choose salmon,  mackerel, sardines, tuna, flaxseed oil, and ground flaxseeds. Aim to eat fish at least 2 times each week. Check food labels carefully to identify foods with trans fats or high amounts of saturated fat. Limit saturated fats. These are found in animal products, such as meats, butter, and cream. Plant sources of saturated fats include palm oil, palm kernel oil, and coconut oil. Avoid foods with partially hydrogenated oils in them. These contain trans fats. Examples are stick margarine, some tub margarines, cookies, crackers, and other baked goods. Avoid fried foods. General information Eat more home-cooked food and less restaurant, buffet, and fast food. Limit or avoid alcohol. Limit foods that are high in added sugar and simple starches such as foods made using white refined flour (white breads, pastries, sweets). Lose weight if you are overweight. Losing just 5-10% of your body weight can help your overall health and prevent diseases such as diabetes and heart disease. Monitor your sodium intake, especially if you have high blood pressure. Talk with your health care provider about your sodium intake. Try to incorporate more vegetarian meals weekly. What foods should I eat? Fruits All fresh, canned (in natural juice), or frozen fruits. Vegetables Fresh or frozen vegetables (raw, steamed, roasted, or grilled). Green salads. Grains  Most grains. Choose whole wheat and whole grains most of the time. Rice and pasta, including brown rice and pastas made with whole wheat. Meats and other proteins Lean, well-trimmed beef, veal, pork, and lamb. Chicken and [place] without skin. All fish and shellfish. Wild duck, rabbit, pheasant, and venison. Egg whites or low-cholesterol egg substitutes. Dried beans, peas, lentils, and tofu. Seeds and most nuts. Dairy Low-fat or nonfat cheeses, including ricotta and mozzarella. Skim or 1% milk (liquid, powdered, or evaporated). Buttermilk made with low-fat milk. Nonfat or  low-fat yogurt. Fats and oils Non-hydrogenated (trans-free) margarines. Vegetable oils, including soybean, sesame, sunflower, olive, avocado, peanut, safflower, corn, canola, and cottonseed. Salad dressings or mayonnaise made with a vegetable oil. Beverages Water (mineral or sparkling). Coffee and tea. Unsweetened ice tea. Diet beverages. Sweets and desserts Sherbet, gelatin, and fruit ice. Small amounts of dark chocolate. Limit all sweets and desserts. Seasonings and condiments All seasonings and condiments. The items listed above may not be a complete list of foods and beverages you can eat. Contact a dietitian for more options. What foods should I avoid? Fruits Canned fruit in heavy syrup. Fruit in cream or butter sauce. Fried fruit. Limit coconut. Vegetables Vegetables cooked in cheese, cream, or butter sauce. Fried vegetables. Grains Breads made with saturated or trans fats, oils, or whole milk. Croissants. Sweet rolls. Donuts. High-fat crackers, such as cheese crackers and chips. Meats and other proteins Fatty meats, such as hot dogs, ribs, sausage, bacon, rib-eye roast or steak. High-fat deli meats, such as salami and bologna. Caviar. Domestic duck and goose. Organ meats, such as liver. Dairy Cream, sour cream, cream cheese, and creamed cottage cheese. Whole-milk cheeses. Whole or 2% milk (liquid, evaporated, or condensed). Whole buttermilk. Cream sauce or high-fat cheese sauce. Whole-milk yogurt. Fats and oils Meat fat, or shortening. Cocoa butter, hydrogenated oils, palm oil, coconut oil, palm kernel oil. Solid fats and shortenings, including bacon fat, salt pork, lard, and butter. Nondairy cream substitutes. Salad dressings with cheese or sour cream. Beverages Regular sodas and any drinks with added sugar. Sweets and desserts Frosting. Pudding. Cookies. Cakes. Pies. Milk chocolate or white chocolate. Buttered syrups. Full-fat ice cream or ice cream drinks. The items listed above  may not be a complete list of foods and beverages to avoid. Contact a dietitian for more information. Summary Heart-healthy meal planning includes limiting unhealthy fats, increasing healthy fats, limiting salt (sodium) intake and making other diet and lifestyle changes. Lose weight if you are overweight. Losing just 5-10% of your body weight can help your overall health and prevent diseases such as diabetes and heart disease. Focus on eating a balance of foods, including fruits and vegetables, low-fat or nonfat dairy, lean protein, nuts and legumes, whole grains, and heart-healthy oils and fats. This information is not intended to replace advice given to you by your health care provider. Make sure you discuss any questions you have with your health care provider. Document Revised: 03/15/2021 Document Reviewed: 03/15/2021 Elsevier Patient Education  2024 [employer].

## 2022-10-21 NOTE — Telephone Encounter (Signed)
Patient is calling to let nurse know that she is about to run out of medication due to taking it wrong.

## 2022-10-22 LAB — BASIC METABOLIC PANEL
BUN/Creatinine Ratio: 14 (ref 12–28)
BUN: 16 mg/dL (ref 10–36)
CO2: 23 mmol/L (ref 20–29)
Calcium: 10 mg/dL (ref 8.7–10.3)
Chloride: 100 mmol/L (ref 96–106)
Creatinine, Ser: 1.11 mg/dL — ABNORMAL HIGH (ref 0.57–1.00)
Glucose: 95 mg/dL (ref 70–99)
Potassium: 4.9 mmol/L (ref 3.5–5.2)
Sodium: 137 mmol/L (ref 134–144)
eGFR: 47 mL/min/{1.73_m2} — ABNORMAL LOW (ref >59)

## 2022-10-22 LAB — T4, FREE: Free T4: 1.27 ng/dL (ref 0.82–1.77)

## 2022-10-22 LAB — TSH: TSH: 10.9 u[IU]/mL — ABNORMAL HIGH (ref 0.450–4.500)

## 2022-10-25 ENCOUNTER — Other Ambulatory Visit (HOSPITAL_COMMUNITY): Payer: Self-pay

## 2022-11-01 ENCOUNTER — Telehealth: Payer: Self-pay

## 2022-11-01 ENCOUNTER — Telehealth: Payer: Self-pay | Admitting: Cardiovascular Disease

## 2022-11-01 DIAGNOSIS — Z79899 Other long term (current) drug therapy: Secondary | ICD-10-CM

## 2022-11-01 MED ORDER — AMLODIPINE BESYLATE 2.5 MG PO TABS: 2.5000 mg | ORAL_TABLET | Freq: Every day | ORAL | 3 refills | Status: DC

## 2022-11-01 NOTE — Telephone Encounter (Signed)
-----   Message from Canary Brim sent at 10/25/2022  8:18 AM EDT ----- Plan to recheck TSH, Free T4 in 6 weeks

## 2022-11-01 NOTE — Telephone Encounter (Signed)
Calling with info 190/70 in office today. Discuss ER precaution with her. Office is asking they they get a call back to discuss the patient. Please advise

## 2022-11-01 NOTE — Telephone Encounter (Signed)
Reached out to patient who checks her blood pressure at home. She brought her meter recently to a visit here and says that we told her it was reading correctly. Last seen by Tessa here on 10/21/22 who increased her Losartan to 75mg  daily. Pt confirmed she is taking this and has stopped Amlodipine as instructed, states feet swelling is much better. Provides following BP log (check pressure daily about 7pm): 9/9 150/70 9/8 146/64 9/7 152/71 9/6 140/65 9/5 140/67 9/4 144/68 Pt went to see PCP today about a spot on her face and they told her they would reach out to [place] to make [place] aware that her BP was elevated. Caller from [place] states BP was 190/70. At time of call had pt check BP and she states is 214/92. Had her sit down, feet flat on floor, resting for 5 minutes while on call and repeat check was 189/86. Denies any vision changes or facial rubor, but does admit she's had a slight headache today that she attributed to having not eaten in several hours.

## 2022-11-01 NOTE — Telephone Encounter (Signed)
Consulted with DOD ROSS who advised that patient should begin Amlodipine 2.5mg  daily if SBP or higher.  Returned call to patient and spoke at length. She did verbal readback of instructions. Will begin checking BP after lunch each day and take Amlodipine accordingly. [facility].

## 2022-11-01 NOTE — Telephone Encounter (Signed)
Returned call to [place]. Left message to return call to office

## 2022-11-01 NOTE — Telephone Encounter (Signed)
Repeat orders placed for TSH and free T4 and scheduled for 12/13/22.

## 2022-11-07 ENCOUNTER — Ambulatory Visit (INDEPENDENT_AMBULATORY_CARE_PROVIDER_SITE_OTHER): Payer: Medicare Other

## 2022-11-07 DIAGNOSIS — R55 Syncope and collapse: Secondary | ICD-10-CM | POA: Diagnosis not present

## 2022-11-08 LAB — CUP PACEART REMOTE DEVICE CHECK
Date Time Interrogation Session: 20240916012600
Implantable Pulse Generator Implant Date: 20240708
Pulse Gen Serial Number: 107973

## 2022-11-21 NOTE — Progress Notes (Signed)
[place] Loop Recorder 

## 2022-12-13 ENCOUNTER — Ambulatory Visit: Payer: Medicare Other | Attending: Pulmonary Disease

## 2022-12-13 DIAGNOSIS — Z79899 Other long term (current) drug therapy: Secondary | ICD-10-CM

## 2022-12-14 ENCOUNTER — Telehealth: Payer: Self-pay | Admitting: Cardiovascular Disease

## 2022-12-14 DIAGNOSIS — I471 Supraventricular tachycardia, unspecified: Secondary | ICD-10-CM

## 2022-12-14 LAB — T4, FREE: Free T4: 1.34 ng/dL (ref 0.82–1.77)

## 2022-12-14 LAB — TSH: TSH: 9.22 u[IU]/mL — ABNORMAL HIGH (ref 0.450–4.500)

## 2022-12-14 NOTE — Telephone Encounter (Signed)
Patient returned staff call regarding lab test orders.

## 2022-12-14 NOTE — Telephone Encounter (Signed)
Hi,   Will you please let Anne Bradshaw know that she can reduce her amiodarone to 200mg  every other day?  Repeat TSH in 2 months.   Thank you! Brandi  Pt advised. OV 02/27/23.

## 2022-12-15 ENCOUNTER — Telehealth: Payer: Self-pay

## 2022-12-15 DIAGNOSIS — I471 Supraventricular tachycardia, unspecified: Secondary | ICD-10-CM

## 2022-12-15 MED ORDER — AMIODARONE HCL 200 MG PO TABS: 200.0000 mg | ORAL_TABLET | ORAL | 3 refills | Status: DC

## 2022-12-15 NOTE — Telephone Encounter (Signed)
The patient has been notified of the result and verbalized understanding.  All questions (if any) were answered. Arvid Right Makilah Dowda, RN 12/15/2022 1:43 PM  Pt is planning to come in for labs on 02/07/23.

## 2022-12-15 NOTE — Telephone Encounter (Signed)
-----   Message from Canary Brim sent at 12/14/2022  1:28 PM EDT ----- Hi,   [name]

## 2022-12-19 ENCOUNTER — Telehealth: Payer: Self-pay | Admitting: Cardiovascular Disease

## 2022-12-19 NOTE — Telephone Encounter (Signed)
Patient reports of intermittent pain/tenderness at ILR site. No redness, drainage or swelling noted. Patient advised to take tylenol as needed for the next 24-48 hours to see if it helps pain. Patient advised to call back if no relief.

## 2022-12-19 NOTE — Telephone Encounter (Signed)
Pt is having pains around loop recorder area. She states she has been having pains since yesterday. Please advise.

## 2023-01-12 ENCOUNTER — Emergency Department (HOSPITAL_COMMUNITY): Payer: Medicare Other

## 2023-01-12 ENCOUNTER — Other Ambulatory Visit: Payer: Self-pay

## 2023-01-12 ENCOUNTER — Ambulatory Visit: Payer: Medicare Other | Attending: Cardiovascular Disease

## 2023-01-12 ENCOUNTER — Emergency Department (HOSPITAL_COMMUNITY)
Admission: EM | Admit: 2023-01-12 | Discharge: 2023-01-12 | Disposition: A | Discharge status: Home / Self Care | Payer: Medicare Other | Attending: Emergency Medicine | Admitting: Emergency Medicine

## 2023-01-12 ENCOUNTER — Encounter (HOSPITAL_COMMUNITY): Payer: Self-pay | Admitting: Emergency Medicine

## 2023-01-12 ENCOUNTER — Telehealth: Payer: Self-pay | Admitting: Cardiovascular Disease

## 2023-01-12 DIAGNOSIS — R0789 Other chest pain: Secondary | ICD-10-CM | POA: Diagnosis present

## 2023-01-12 DIAGNOSIS — Z9889 Other specified postprocedural states: Secondary | ICD-10-CM

## 2023-01-12 DIAGNOSIS — I471 Supraventricular tachycardia, unspecified: Secondary | ICD-10-CM

## 2023-01-12 DIAGNOSIS — J45909 Unspecified asthma, uncomplicated: Secondary | ICD-10-CM | POA: Diagnosis not present

## 2023-01-12 DIAGNOSIS — I10 Essential (primary) hypertension: Secondary | ICD-10-CM | POA: Insufficient documentation

## 2023-01-12 DIAGNOSIS — Z7982 Long term (current) use of aspirin: Secondary | ICD-10-CM | POA: Insufficient documentation

## 2023-01-12 DIAGNOSIS — Z79899 Other long term (current) drug therapy: Secondary | ICD-10-CM | POA: Insufficient documentation

## 2023-01-12 LAB — CBC WITH DIFFERENTIAL/PLATELET
Abs Immature Granulocytes: 0.01 10*3/uL (ref 0.00–0.07)
Basophils Absolute: 0.1 10*3/uL (ref 0.0–0.1)
Basophils Relative: 1 %
Eosinophils Absolute: 0.3 10*3/uL (ref 0.0–0.5)
Eosinophils Relative: 6 %
HCT: 35.9 % — ABNORMAL LOW (ref 36.0–46.0)
Hemoglobin: 11.4 g/dL — ABNORMAL LOW (ref 12.0–15.0)
Immature Granulocytes: 0 %
Lymphocytes Relative: 29 %
Lymphs Abs: 1.6 10*3/uL (ref 0.7–4.0)
MCH: 32.1 pg (ref 26.0–34.0)
MCHC: 31.8 g/dL (ref 30.0–36.0)
MCV: 101.1 fL — ABNORMAL HIGH (ref 80.0–100.0)
Monocytes Absolute: 0.5 10*3/uL (ref 0.1–1.0)
Monocytes Relative: 9 %
Neutro Abs: 3 10*3/uL (ref 1.7–7.7)
Neutrophils Relative %: 55 %
Platelets: 152 10*3/uL (ref 150–400)
RBC: 3.55 MIL/uL — ABNORMAL LOW (ref 3.87–5.11)
RDW: 13.5 % (ref 11.5–15.5)
WBC: 5.4 10*3/uL (ref 4.0–10.5)
nRBC: 0 % (ref 0.0–0.2)

## 2023-01-12 LAB — BASIC METABOLIC PANEL
Anion gap: 7 (ref 5–15)
BUN: 15 mg/dL (ref 8–23)
CO2: 21 mmol/L — ABNORMAL LOW (ref 22–32)
Calcium: 8.9 mg/dL (ref 8.9–10.3)
Chloride: 106 mmol/L (ref 98–111)
Creatinine, Ser: 1.06 mg/dL — ABNORMAL HIGH (ref 0.44–1.00)
GFR, Estimated: 49 mL/min — ABNORMAL LOW (ref >60)
Glucose, Bld: 107 mg/dL — ABNORMAL HIGH (ref 70–99)
Potassium: 3.7 mmol/L (ref 3.5–5.1)
Sodium: 134 mmol/L — ABNORMAL LOW (ref 135–145)

## 2023-01-12 LAB — CUP PACEART REMOTE DEVICE CHECK
Date Time Interrogation Session: 20241121092400
Pulse Gen Serial Number: 107973

## 2023-01-12 LAB — TROPONIN I (HIGH SENSITIVITY)
Troponin I (High Sensitivity): 4 ng/L (ref <18)
Troponin I (High Sensitivity): 5 ng/L (ref <18)

## 2023-01-12 NOTE — Telephone Encounter (Signed)
Spoke with pt regarding her letter and I have let her know to disregard it because we have gotten one now.  Pt wants to talk to a nurse about why her ILR hurts in her chest. Pt would like a call back ASAP

## 2023-01-12 NOTE — Telephone Encounter (Signed)
Spoke with patient, she is very anxious about the site where her ILR is placed.   ER could not tell her why it hurt today after extensive testing.  Patient states pain is severe. She is coming at 3pm this afternoon to Device clinic for Dr Nelly Laurence and I to evaluate.   Patient verbalizes agreement with plan/appt.

## 2023-01-12 NOTE — Telephone Encounter (Signed)
Spoke with patient and she does not have chest pain. She did have chest over night but was seen in the ED.   She called about a letter she received in the mail concerning her device. She she would like call to discuss.  Patient is a little anxious. Encouraged to try and relax and take slow breaths.

## 2023-01-12 NOTE — ED Provider Notes (Signed)
[place] EMERGENCY DEPARTMENT AT [facility] Provider Note   CSN: [id number] Arrival date & time: 01/12/23  0258     History  Chief Complaint  Patient presents with   Chest Pain    IDALINA GOLASZEWSKI is a 89 y.o. female.  HPI     This is a 89 year old female who presents with chest pain.  Patient reports that she woke up this morning around 230 with chest pain.  She states that it is right over where her loop recorder is.  Reports it is pressure.  Had some improvement with interventions by EMS.  Currently is chest pain-free.  Denies any history of heart disease but does have a loop recorder and history of tachyarrhythmia.  Denies shortness of breath.  Denies any recent fever or cough.    Home Medications Prior to Admission medications   Medication Sig Start Date End Date Taking? Authorizing Provider  acetaminophen (TYLENOL) 325 MG tablet Take 650 mg by mouth daily as needed for mild pain.    [provider]  amiodarone (PACERONE) 200 MG tablet Take 1 tablet (200 mg total) by mouth every other day. 12/15/22   Jeanella Craze, NP  amLODipine (NORVASC) 2.5 MG tablet Take 1 tablet (2.5 mg total) by mouth daily. 11/01/22   Pricilla Riffle, MD  aspirin EC 81 MG tablet Take 1 tablet (81 mg total) by mouth daily. Swallow whole. 08/25/21   Arthor Captain, PA-C  Cholecalciferol (VITAMIN D3) 250 MCG (10000 UT) capsule Take 10,000 Units by mouth daily. Pt states 200 mg daily    [provider]  famciclovir (FAMVIR) 500 MG tablet TAKE 1 TABLET BY MOUTH EVERY DAY Patient taking differently: Take 250 mg by mouth daily. 03/29/21   Rankin, Alford Highland, MD  losartan (COZAAR) 25 MG tablet Take 1 tablet (25 mg total) by mouth daily. Take in addition to the 50 mg tablet to equal a dose of 75 mg daily 10/21/22   Sharlene Dory, PA-C  losartan (COZAAR) 50 MG tablet Take 1 tablet (50 mg total) by mouth daily. 10/03/22 01/01/23  Nahser, Deloris Ping, MD  nitroGLYCERIN (NITROSTAT) 0.4 MG SL  tablet Take one every five minutes under your tongue for up to 3 doses if you have onset of  shortness of breath. 08/25/21   Harris, Cammy Copa, PA-C  Polyethyl Glycol-Propyl Glycol (SYSTANE) 0.4-0.3 % SOLN Apply 1 drop to eye 4 (four) times daily.    [provider]  prednisoLONE acetate (PRED FORTE) 1 % ophthalmic suspension 1 DROP IN BOTH EYES EVERY OTHER DAY Patient taking differently: Place 1 drop into both eyes See admin instructions. 1 DROP IN BOTH EYES EVERY OTHER DAY 07/27/21   Rankin, Alford Highland, MD  vitamin B-12 (CYANOCOBALAMIN) 1000 MCG tablet Take 1,000 mcg by mouth daily.    [provider]      Allergies    Sulfa antibiotics    Review of Systems   Review of Systems  Constitutional:  Negative for fever.  Respiratory:  Negative for cough and shortness of breath.   Cardiovascular:  Positive for chest pain. Negative for leg swelling.  All other systems reviewed and are negative.   Physical Exam Updated Vital Signs BP (!) 136/59   Pulse (!) 51   Temp 98.1 F (36.7 C)   Resp 18   Ht 1.626 m (5\' 4" )   Wt 50.8 kg   SpO2 97%   BMI 19.22 kg/m  Physical Exam Vitals and nursing note reviewed.  Constitutional:      Appearance: She is well-developed. She is not ill-appearing.     Comments: Elderly, nontoxic-appearing  HENT:     Head: Normocephalic and atraumatic.  Eyes:     Pupils: Pupils are equal, round, and reactive to light.  Cardiovascular:     Rate and Rhythm: Normal rate and regular rhythm.     Heart sounds: Normal heart sounds.  Pulmonary:     Effort: Pulmonary effort is normal. No respiratory distress.     Breath sounds: No wheezing.  Abdominal:     General: Bowel sounds are normal.     Palpations: Abdomen is soft.  Musculoskeletal:     Cervical back: Neck supple.     Right lower leg: No edema.     Left lower leg: No edema.  Skin:    General: Skin is warm and dry.  Neurological:     General: No focal deficit present.     Mental Status: She is  alert and oriented to person, place, and time.     ED Results / Procedures / Treatments   Labs (all labs ordered are listed, but only abnormal results are displayed) Labs Reviewed  CBC WITH DIFFERENTIAL/PLATELET - Abnormal; Notable for the following components:      Result Value   RBC 3.55 (*)    Hemoglobin 11.4 (*)    HCT 35.9 (*)    MCV 101.1 (*)    All other components within normal limits  BASIC METABOLIC PANEL - Abnormal; Notable for the following components:   Sodium 134 (*)    CO2 21 (*)    Glucose, Bld 107 (*)    Creatinine, Ser 1.06 (*)    GFR, Estimated 49 (*)    All other components within normal limits  TROPONIN I (HIGH SENSITIVITY)  TROPONIN I (HIGH SENSITIVITY)    EKG EKG Interpretation Date/Time:  Thursday January 12 2023 03:07:18 EST Ventricular Rate:  58 PR Interval:  183 QRS Duration:  124 QT Interval:  438 QTC Calculation: 431 R Axis:   -52  Text Interpretation: Sinus rhythm Nonspecific IVCD with LAD Confirmed by Ross Marcus ([id number]) on 01/12/2023 3:24:56 AM  Radiology DG Chest Portable 1 View  Result Date: 01/12/2023 CLINICAL DATA:  Chest pain. EXAM: PORTABLE CHEST 1 VIEW COMPARISON:  08/24/2022. FINDINGS: The heart size and mediastinal contours are within normal limits. There is atherosclerotic calcification of the aorta. A loop recorder device is present over the left chest. Stable apical pleural scarring is noted bilaterally. No consolidation, effusion, or pneumothorax is seen. No acute osseous abnormality. IMPRESSION: Stable chest with no active disease. Electronically Signed   By: Thornell Sartorius M.D.   On: 01/12/2023 03:53    Procedures Procedures    Medications Ordered in ED Medications - No data to display  ED Course/ Medical Decision Making/ A&P                                 Medical Decision Making Amount and/or Complexity of Data Reviewed Labs: ordered. Radiology: ordered.   This patient presents to the ED for concern of  chest pain, this involves an extensive number of treatment options, and is a complaint that carries with it a high risk of complications and morbidity.  I considered the following differential and admission for this acute, potentially life threatening condition.  The differential diagnosis includes ACS, PE, pneumothorax, pneumonia, arrhythmia  MDM:    This  is a 89 year old female who presents with chest pain.  She is nontoxic.  She has a loop recorder for SVT.  She has a history of hypertension as well.  She is currently symptom-free.  EKG does not show any evidence of acute arrhythmia or ischemia.  Troponin x 2 negative.  Basic lab work reassuring.  Chest x-ray without pneumothorax or pneumonia.  She is not significantly tachycardic or hypoxic.  Doubt PE.  Will interrogate loop recorder.  Pending interrogation, patient can likely be discharged with close outpatient follow-up.  (Labs, imaging, consults)  Labs: I Ordered, and personally interpreted labs.  The pertinent results include: CBC, BMP, troponin x 2  Imaging Studies ordered: I ordered imaging studies including chest x-ray I independently visualized and interpreted imaging. I agree with the radiologist interpretation  Additional history obtained from review.  External records from outside source obtained and reviewed including cardiology notes  Cardiac Monitoring: The patient was maintained on a cardiac monitor.  If on the cardiac monitor, I personally viewed and interpreted the cardiac monitored which showed an underlying rhythm of: Sinus  Reevaluation: After the interventions noted above, I reevaluated the patient and found that they have :resolved  Social Determinants of Health:  elderly, lives independently  Disposition: Pending loop recorder interrogation  Co morbidities that complicate the patient evaluation  Past Medical History:  Diagnosis Date   Asthma    as child   GERD (gastroesophageal reflux disease)    High  blood pressure    Iritis    Reflux    SOB (shortness of breath)      Medicines No orders of the defined types were placed in this encounter.   I have reviewed the patients home medicines and have made adjustments as needed  Problem List / ED Course: Problem List Items Addressed This Visit   None               Final Clinical Impression(s) / ED Diagnoses Final diagnoses:  None    Rx / DC Orders ED Discharge Orders     None         Shon Baton, MD 01/12/23 [phone]

## 2023-01-12 NOTE — Patient Instructions (Signed)
Will discuss with your primary cardiologist and follow up.

## 2023-01-12 NOTE — ED Triage Notes (Signed)
Pt arrived via EMS from home with chest pain that woke her up from sleep. Pt reports pain is 5/10. EMS reports history of tachy arrhythmia. Pt has loop recorder in place. EMS gave 324mg  aspirin, 1 nitroglycerin and 4mg  of morphine which lowered pts pain to 1/10.   HR 60 BP 156/76

## 2023-01-12 NOTE — Telephone Encounter (Signed)
Pt c/o of Chest Pain: STAT if CP now or developed within 24 hours  1. Are you having CP right now?  No - patient around ILR only occurred last night. Patient went to ED and states she just got home about an hour ago.  2. Are you experiencing any other symptoms (ex. SOB, nausea, vomiting, sweating)?  No  3. How long have you been experiencing CP?  Started last night around 2:15 AM and patient went to the ED.  4. Is your CP continuous or coming and going?  Continuous   5. Have you taken Nitroglycerin?  Yes and baby aspirin + low dose of morphine in the ambulance  ?

## 2023-01-12 NOTE — Progress Notes (Signed)
Pt seen in device clinic as add on for complaints of chest pain near loop implant.  Loop was implanted 08/26/2022 for SVT.  Assessed loop site.  No redness noted.  No tenderness with palpitation.  Pt states she continues to have some discomfort, but not as sharp as earlier.  No obvious s/s of infection noted.    Per Pt she was asleep when a sharp pain to her chest woke her up.  Pt called EMS.  She was transported to [facility] ER where work up was reassuring.     Pt's loop recorder is just below her skin.   We discussed that it's possible if she bumped the device it may have caused the discomfort.  We discussed that all testing at [facility] was reassuring.  Advised Pt would discuss further with her general cardiology team and call her with further recommendations.  She also understands return to ER precautions.  Will follow up.

## 2023-01-12 NOTE — ED Provider Notes (Signed)
7:09 AM Patient signed out to me by previous ED physician. Pt is a 89 yo female presenting for chest pain. ECG stable, trops stable, electrolytes stable. Has loop recorder and awaiting interrogation. Hx of SVT.     Physical Exam  BP (!) 155/57   Pulse (!) 58   Temp 98.1 F (36.7 C)   Resp 18   Ht 5\' 4"  (1.626 m)   Wt 50.8 kg   SpO2 97%   BMI 19.22 kg/m   Physical Exam  Procedures  Procedures  ED Course / MDM    Medical Decision Making Amount and/or Complexity of Data Reviewed Labs: ordered. Radiology: ordered.   Patient remained stable emergency department without chest pain and left several hours.  Troponins are stable.  Electrolytes stable.  ECG stable.  Chest x-ray stable.  Unlikely pulmonary embolism.  No signs or symptoms of aortic dissection.  Loop recorder interrogated in the emergency department and stable.  Patient recommended for close follow-up with her cardiology office.   Patient in no distress and overall condition improved here in the ED. Detailed discussions were had with the patient regarding current findings, and need for close f/u with PCP or on call doctor. The patient has been instructed to return immediately if the symptoms worsen in any way for re-evaluation. Patient verbalized understanding and is in agreement with current care plan. All questions answered prior to discharge.        Edwin Dada P, DO 01/12/23 0930

## 2023-01-16 ENCOUNTER — Ambulatory Visit (INDEPENDENT_AMBULATORY_CARE_PROVIDER_SITE_OTHER): Payer: Medicare Other

## 2023-01-16 DIAGNOSIS — R55 Syncope and collapse: Secondary | ICD-10-CM

## 2023-02-10 NOTE — Progress Notes (Signed)
[place] Loop Recorder 

## 2023-02-17 ENCOUNTER — Ambulatory Visit: Payer: Medicare Other | Admitting: Physician Assistant

## 2023-02-24 ENCOUNTER — Ambulatory Visit (INDEPENDENT_AMBULATORY_CARE_PROVIDER_SITE_OTHER): Payer: Medicare Other

## 2023-02-24 ENCOUNTER — Telehealth: Payer: Self-pay | Admitting: Pulmonary Disease

## 2023-02-24 ENCOUNTER — Telehealth: Payer: Self-pay

## 2023-02-24 DIAGNOSIS — R001 Bradycardia, unspecified: Secondary | ICD-10-CM | POA: Diagnosis not present

## 2023-02-24 NOTE — Telephone Encounter (Signed)
 Pt is coming to the office to get help with her loop phone app.

## 2023-02-24 NOTE — Telephone Encounter (Signed)
 Pt called Device Clinic c/o pain from ILR site.  She was brought in by Device Clinic to review.  Asked to assist in evaluation.  She reports she has intermittent pain at the site of implant and states I can't stand it, I can't sleep because of this.  She was seen in the ER on 01/12/23 for the same.  EKG, troponin, electrolytes stable.    Pt examined in room > site well healed, palpated ILR and moved ILR under skin over rib and she states this is similar to the pain she had before.  Suspect it is mechanical irritation from ILR moving over rib in repetitive fashion given able to reproduce. No erythema, edema, or evidence of infection.  ILR reviewed, no episodes from 10/2022 - 02/24/23  Pt would like to have the loop recorder removed.  Discussed reasons for loop and how it is helpful in her care.  She indicates understanding.  Will plan for visit with Dr. Cindie for loop removal.    Daphne Barrack, MSN, APRN, NP-C, AGACNP-BC [facility] - Electrophysiology  02/24/2023, 3:15 PM

## 2023-02-27 ENCOUNTER — Encounter: Payer: Self-pay | Admitting: Cardiovascular Disease

## 2023-02-27 ENCOUNTER — Ambulatory Visit: Payer: Medicare Other | Attending: Cardiovascular Disease | Admitting: Cardiovascular Disease

## 2023-02-27 VITALS — BP 142/68 | HR 57 | Ht 64.0 in | Wt 112.0 lb

## 2023-02-27 DIAGNOSIS — I10 Essential (primary) hypertension: Secondary | ICD-10-CM

## 2023-02-27 DIAGNOSIS — I471 Supraventricular tachycardia, unspecified: Secondary | ICD-10-CM | POA: Diagnosis not present

## 2023-02-27 LAB — CUP PACEART REMOTE DEVICE CHECK
Date Time Interrogation Session: 20250103143500
Pulse Gen Serial Number: 107973

## 2023-02-27 NOTE — Patient Instructions (Signed)
Medication Instructions:  No changes *If you need a refill on your cardiac medications before your next appointment, please call your pharmacy*   Lab Work: none If you have labs (blood work) drawn today and your tests are completely normal, you will receive your results only by: [place] (if you have MyChart) OR A paper copy in the mail If you have any lab test that is abnormal or we need to change your treatment, we will call you to review the results.   Testing/Procedures: none   Follow-Up: At [facility], you and your health needs are our priority.  As part of our continuing mission to provide you with exceptional heart care, we have created designated Provider Care Teams.  These Care Teams include your primary Cardiologist (physician) and Advanced Practice Providers (APPs -  Physician Assistants and Nurse Practitioners) who all work together to provide you with the care you need, when you need it.  We recommend signing up for the patient portal called "MyChart".  Sign up information is provided on this After Visit Summary.  MyChart is used to connect with patients for Virtual Visits (Telemedicine).  Patients are able to view lab/test results, encounter notes, upcoming appointments, etc.  Non-urgent messages can be sent to your provider as well.   To learn more about what you can do with MyChart, go to [email or web address].    Your next appointment:   6 month(s)  Provider:   Lauree Chandler, MD

## 2023-02-27 NOTE — Progress Notes (Signed)
 Chief Complaint  Patient presents with   Follow-up    SVT   History of Present Illness: 89 yo female with history of GERD, SVT and HTN who is here today for follow up. I saw her as a new consult in July 2023 for the evaluation of dyspnea. She was seen in the ED 08/25/21 with dyspnea and nausea. No chest pain. No ischemic changes on EKG. Troponin negative. She had no complaints when I met her in July 2023.  Echo August 2023 with LVEF=60-65%, no significant valve disease. She was hospitalized in July 2024 with dizziness and near syncope and was found to have SVT requiring adenosine to convert to sinus. She was started on amiodarone . Loop recorder implanted 08/26/22. She did not tolerate Norvasc  10 mg daily secondary to LE edema.   She is here today for follow up. The patient denies any chest pain, dyspnea, palpitations, lower extremity edema, orthopnea, PND, dizziness, near syncope or syncope.   Primary Care Physician: Leonel Cole, MD  Past Medical History:  Diagnosis Date   Asthma    as child   GERD (gastroesophageal reflux disease)    High blood pressure    Iritis    Reflux    SOB (shortness of breath)     Past Surgical History:  Procedure Laterality Date   LOOP RECORDER INSERTION N/A 08/26/2022   Procedure: LOOP RECORDER INSERTION;  Surgeon: Cindie Ole DASEN, MD;  Location: [facility];  Service: Cardiovascular;  Laterality: N/A;   LYMPH NODE BIOPSY Right 07/29/2014   Procedure: RIGHT INGUINAL LYMPH NODE BIOPSY;  Surgeon: Jina Nephew, MD;  Location: [facility];  Service: General;  Laterality: Right;   NO PAST SURGERIES     SKIN BIOPSY Right 07/29/2014   Procedure: SKIN PUNCH BIOPSY;  Surgeon: Jina Nephew, MD;  Location: [facility];  Service: General;  Laterality: Right;    Current Outpatient Medications  Medication Sig Dispense Refill   acetaminophen  (TYLENOL ) 325 MG tablet Take 650 mg by mouth daily as needed for mild pain.     amiodarone   (PACERONE ) 200 MG tablet Take 1 tablet (200 mg total) by mouth every other day. 45 tablet 3   amLODipine  (NORVASC ) 2.5 MG tablet Take 1 tablet (2.5 mg total) by mouth daily. 90 tablet 3   aspirin  EC 81 MG tablet Take 1 tablet (81 mg total) by mouth daily. Swallow whole. 30 tablet 1   Cholecalciferol  (VITAMIN D3) 250 MCG (10000 UT) capsule Take 10,000 Units by mouth daily. Pt states 200 mg daily     famciclovir  (FAMVIR ) 250 MG tablet Take 250 mg by mouth daily.     levothyroxine (SYNTHROID) 50 MCG tablet Take 50 mcg by mouth daily.     losartan  (COZAAR ) 25 MG tablet Take 1 tablet (25 mg total) by mouth daily. Take in addition to the 50 mg tablet to equal a dose of 75 mg daily 90 tablet 3   nitroGLYCERIN  (NITROSTAT ) 0.4 MG SL tablet Take one every five minutes under your tongue for up to 3 doses if you have onset of  shortness of breath. 30 tablet 0   Polyethyl Glycol-Propyl Glycol (SYSTANE) 0.4-0.3 % SOLN Apply 1 drop to eye 4 (four) times daily.     polyethylene glycol (MIRALAX  / GLYCOLAX ) 17 g packet Take 17 g by mouth at bedtime.     prednisoLONE  acetate (PRED FORTE ) 1 % ophthalmic suspension 1 DROP IN BOTH EYES EVERY OTHER DAY 5 mL 6  vitamin B-12 (CYANOCOBALAMIN ) 1000 MCG tablet Take 1,000 mcg by mouth daily.     losartan  (COZAAR ) 50 MG tablet Take 1 tablet (50 mg total) by mouth daily. 90 tablet 3   No current facility-administered medications for this visit.    Allergies  Allergen Reactions   Sulfa Antibiotics     Social History   Socioeconomic History   Marital status: Married    Spouse name: Not on file   Number of children: 2   Years of education: Not on file   Highest education level: Not on file  Occupational History   Occupation: Retired  Tobacco Use   Smoking status: Never   Smokeless tobacco: Never  Vaping Use   Vaping status: Never Used  Substance and Sexual Activity   Alcohol  use: No    Alcohol /week: 0.0 standard drinks of alcohol    Drug use: No   Sexual  activity: Not on file  Other Topics Concern   Not on file  Social History Narrative   Not on file   Social Drivers of Health   Financial Resource Strain: Not on file  Food Insecurity: No Food Insecurity (08/24/2022)   Hunger Vital Sign    Worried About Running Out of Food in the Last Year: Never true    Ran Out of Food in the Last Year: Never true  Transportation Needs: No Transportation Needs (08/24/2022)   PRAPARE - Administrator, Civil Service (Medical): No    Lack of Transportation (Non-Medical): No  Physical Activity: Not on file  Stress: Not on file  Social Connections: Not on file  Intimate Partner Violence: Not At Risk (08/24/2022)   Humiliation, Afraid, Rape, and Kick questionnaire    Fear of Current or Ex-Partner: No    Emotionally Abused: No    Physically Abused: No    Sexually Abused: No    Family History  Problem Relation Age of Onset   Congestive Heart Failure Father    Congestive Heart Failure Brother    Congestive Heart Failure Paternal Grandmother     Review of Systems:  As stated in the HPI and otherwise negative.   BP (!) 142/68   Pulse (!) 57   Ht 5' 4 (1.626 m)   Wt 50.8 kg   SpO2 94%   BMI 19.22 kg/m   Physical Examination:  General: Well developed, well nourished, NAD  HEENT: OP clear, mucus membranes moist  SKIN: warm, dry. No rashes. Neuro: No focal deficits  Musculoskeletal: Muscle strength 5/5 all ext  Psychiatric: Mood and affect normal  Neck: No JVD, no carotid bruits, no thyromegaly, no lymphadenopathy.  Lungs:Clear bilaterally, no wheezes, rhonci, crackles Cardiovascular: Regular rate and rhythm. No murmurs, gallops or rubs. Abdomen:Soft. Bowel sounds present. Non-tender.  Extremities: No lower extremity edema. Pulses are 2 + in the bilateral DP/PT.  EKG:  EKG is not ordered today. The ekg ordered today demonstrates   Recent Labs: 08/24/2022: Magnesium  2.1 09/19/2022: ALT 16 12/13/2022: TSH 9.220 01/12/2023: BUN 15;  Creatinine, Ser 1.06; Hemoglobin 11.4; Platelets 152; Potassium 3.7; Sodium 134   Lipid Panel No results found for: CHOL, TRIG, HDL, CHOLHDL, VLDL, LDLCALC, LDLDIRECT   Wt Readings from Last 3 Encounters:  02/27/23 50.8 kg  01/12/23 50.8 kg  10/21/22 52.5 kg    Assessment and Plan:   1.SVT: No recent palpitations or documented SVT. Continue amiodarone .   2. HTN: BP is well controlled. No changes today  Labs/ tests ordered today include:  No orders of  the defined types were placed in this encounter.  Disposition:   F/U with me as needed.   Signed, Lonni Cash, MD 02/27/2023 4:35 PM    [facility] [address] [place], [place], [place]  [address] Phone: [phone]; Fax: [phone]

## 2023-02-28 NOTE — Telephone Encounter (Signed)
 Left message for patient to call back

## 2023-03-01 NOTE — Telephone Encounter (Signed)
 Pt returning call, requesting cb

## 2023-03-31 ENCOUNTER — Ambulatory Visit: Payer: Medicare Other | Attending: Cardiology

## 2023-03-31 DIAGNOSIS — R001 Bradycardia, unspecified: Secondary | ICD-10-CM

## 2023-04-01 LAB — CUP PACEART REMOTE DEVICE CHECK
Date Time Interrogation Session: 20250207014000
Pulse Gen Serial Number: 107973

## 2023-04-03 NOTE — Addendum Note (Signed)
 Addended by: VICCI SELLER A on: 04/03/2023 09:37 AM   Modules accepted: Orders

## 2023-04-03 NOTE — Progress Notes (Signed)
 [facility] Summary Report / Loop Recorder

## 2023-05-03 NOTE — Progress Notes (Signed)
 [facility] Summary Report / Loop Recorder

## 2023-05-03 NOTE — Addendum Note (Signed)
 Addended by: Elease Etienne A on: 05/03/2023 10:55 AM   Modules accepted: Orders

## 2023-05-04 ENCOUNTER — Other Ambulatory Visit: Payer: Self-pay | Admitting: Family Medicine

## 2023-05-04 ENCOUNTER — Ambulatory Visit
Admission: RE | Admit: 2023-05-04 | Discharge: 2023-05-04 | Disposition: A | Discharge status: Home / Self Care | Source: Ambulatory Visit | Attending: Family Medicine | Admitting: Family Medicine

## 2023-05-04 DIAGNOSIS — R0989 Other specified symptoms and signs involving the circulatory and respiratory systems: Secondary | ICD-10-CM

## 2023-05-05 ENCOUNTER — Ambulatory Visit: Payer: Medicare Other

## 2023-05-05 DIAGNOSIS — R001 Bradycardia, unspecified: Secondary | ICD-10-CM

## 2023-05-05 LAB — CUP PACEART REMOTE DEVICE CHECK
Date Time Interrogation Session: 20250314014400
Pulse Gen Serial Number: 107973

## 2023-05-11 ENCOUNTER — Emergency Department (HOSPITAL_COMMUNITY)

## 2023-05-11 ENCOUNTER — Other Ambulatory Visit: Payer: Self-pay

## 2023-05-11 ENCOUNTER — Emergency Department (HOSPITAL_COMMUNITY)
Admission: EM | Admit: 2023-05-11 | Discharge: 2023-05-11 | Disposition: A | Discharge status: Home / Self Care | Attending: Emergency Medicine | Admitting: Emergency Medicine

## 2023-05-11 ENCOUNTER — Encounter (HOSPITAL_COMMUNITY): Payer: Self-pay

## 2023-05-11 DIAGNOSIS — R300 Dysuria: Secondary | ICD-10-CM | POA: Diagnosis not present

## 2023-05-11 DIAGNOSIS — Z7982 Long term (current) use of aspirin: Secondary | ICD-10-CM | POA: Diagnosis not present

## 2023-05-11 DIAGNOSIS — M25552 Pain in left hip: Secondary | ICD-10-CM | POA: Diagnosis not present

## 2023-05-11 DIAGNOSIS — M25551 Pain in right hip: Secondary | ICD-10-CM | POA: Insufficient documentation

## 2023-05-11 DIAGNOSIS — M549 Dorsalgia, unspecified: Secondary | ICD-10-CM | POA: Diagnosis present

## 2023-05-11 DIAGNOSIS — M545 Low back pain, unspecified: Secondary | ICD-10-CM

## 2023-05-11 LAB — CK: Total CK: 31 U/L — ABNORMAL LOW (ref 38–234)

## 2023-05-11 LAB — URINALYSIS, W/ REFLEX TO CULTURE (INFECTION SUSPECTED)
Bilirubin Urine: NEGATIVE
Glucose, UA: NEGATIVE mg/dL
Hgb urine dipstick: NEGATIVE
Ketones, ur: NEGATIVE mg/dL
Leukocytes,Ua: NEGATIVE
Nitrite: NEGATIVE
Protein, ur: NEGATIVE mg/dL
Specific Gravity, Urine: 1.01 (ref 1.005–1.030)
pH: 8 (ref 5.0–8.0)

## 2023-05-11 LAB — COMPREHENSIVE METABOLIC PANEL
ALT: 24 U/L (ref 0–44)
AST: 31 U/L (ref 15–41)
Albumin: 3 g/dL — ABNORMAL LOW (ref 3.5–5.0)
Alkaline Phosphatase: 80 U/L (ref 38–126)
Anion gap: 8 (ref 5–15)
BUN: 11 mg/dL (ref 8–23)
CO2: 24 mmol/L (ref 22–32)
Calcium: 9.3 mg/dL (ref 8.9–10.3)
Chloride: 100 mmol/L (ref 98–111)
Creatinine, Ser: 1.03 mg/dL — ABNORMAL HIGH (ref 0.44–1.00)
GFR, Estimated: 51 mL/min — ABNORMAL LOW (ref >60)
Glucose, Bld: 108 mg/dL — ABNORMAL HIGH (ref 70–99)
Potassium: 4.1 mmol/L (ref 3.5–5.1)
Sodium: 132 mmol/L — ABNORMAL LOW (ref 135–145)
Total Bilirubin: 0.7 mg/dL (ref 0.0–1.2)
Total Protein: 6.9 g/dL (ref 6.5–8.1)

## 2023-05-11 LAB — CBC WITH DIFFERENTIAL/PLATELET
Abs Immature Granulocytes: 0.04 10*3/uL (ref 0.00–0.07)
Basophils Absolute: 0.1 10*3/uL (ref 0.0–0.1)
Basophils Relative: 1 %
Eosinophils Absolute: 0.1 10*3/uL (ref 0.0–0.5)
Eosinophils Relative: 1 %
HCT: 35.2 % — ABNORMAL LOW (ref 36.0–46.0)
Hemoglobin: 11.6 g/dL — ABNORMAL LOW (ref 12.0–15.0)
Immature Granulocytes: 1 %
Lymphocytes Relative: 20 %
Lymphs Abs: 1.4 10*3/uL (ref 0.7–4.0)
MCH: 32.5 pg (ref 26.0–34.0)
MCHC: 33 g/dL (ref 30.0–36.0)
MCV: 98.6 fL (ref 80.0–100.0)
Monocytes Absolute: 0.6 10*3/uL (ref 0.1–1.0)
Monocytes Relative: 9 %
Neutro Abs: 4.9 10*3/uL (ref 1.7–7.7)
Neutrophils Relative %: 68 %
Platelets: 423 10*3/uL — ABNORMAL HIGH (ref 150–400)
RBC: 3.57 MIL/uL — ABNORMAL LOW (ref 3.87–5.11)
RDW: 14.4 % (ref 11.5–15.5)
WBC: 7.2 10*3/uL (ref 4.0–10.5)
nRBC: 0 % (ref 0.0–0.2)

## 2023-05-11 MED ORDER — MORPHINE SULFATE (PF) 2 MG/ML IV SOLN
2.0000 mg | Freq: Once | INTRAVENOUS | Status: AC
  Administered 2023-05-11: 2 mg via INTRAVENOUS
  Filled 2023-05-11: qty 1

## 2023-05-11 MED ORDER — SODIUM CHLORIDE 0.9 % IV BOLUS
500.0000 mL | Freq: Once | INTRAVENOUS | Status: AC
  Administered 2023-05-11: 500 mL via INTRAVENOUS

## 2023-05-11 MED ORDER — LIDOCAINE 5 % EX PTCH
1.0000 | MEDICATED_PATCH | Freq: Once | CUTANEOUS | Status: DC
  Administered 2023-05-11: 1 via TRANSDERMAL
  Filled 2023-05-11: qty 1

## 2023-05-11 MED ORDER — LIDOCAINE 5 % EX PTCH: 1.0000 | MEDICATED_PATCH | CUTANEOUS | 0 refills | Status: AC

## 2023-05-11 NOTE — ED Provider Notes (Signed)
 [place] EMERGENCY DEPARTMENT AT [facility] Provider Note   CSN: [id number] Arrival date & time: 05/11/23  1217     History  Chief Complaint  Patient presents with   Back Pain    Anne Bradshaw is a 89 y.o. female.  HPI 89 year old female presents with back pain.  She has had back pain and hip pain for a long time.  Since she had the flu a couple weeks ago her symptoms have been more progressive and painful.  She has chronic foot neuropathy but no new numbness.  No weakness in her legs but her back started spasming today. No weakness or incontinence. She had some dysuria this AM. No abdominal pain, chest pain, dyspnea.  Home Medications Prior to Admission medications   Medication Sig Start Date End Date Taking? Authorizing Provider  acetaminophen (TYLENOL) 325 MG tablet Take 650 mg by mouth daily as needed for mild pain.    [provider]  amiodarone (PACERONE) 200 MG tablet Take 1 tablet (200 mg total) by mouth every other day. 12/15/22   Jeanella Craze, NP  amLODipine (NORVASC) 2.5 MG tablet Take 1 tablet (2.5 mg total) by mouth daily. 11/01/22   Pricilla Riffle, MD  aspirin EC 81 MG tablet Take 1 tablet (81 mg total) by mouth daily. Swallow whole. 08/25/21   Arthor Captain, PA-C  Cholecalciferol (VITAMIN D3) 250 MCG (10000 UT) capsule Take 10,000 Units by mouth daily. Pt states 200 mg daily    [provider]  famciclovir (FAMVIR) 250 MG tablet Take 250 mg by mouth daily. 11/21/22   [provider]  levothyroxine (SYNTHROID) 50 MCG tablet Take 50 mcg by mouth daily. 01/10/23   [provider]  losartan (COZAAR) 25 MG tablet Take 1 tablet (25 mg total) by mouth daily. Take in addition to the 50 mg tablet to equal a dose of 75 mg daily 10/21/22   Sharlene Dory, PA-C  losartan (COZAAR) 50 MG tablet Take 1 tablet (50 mg total) by mouth daily. 10/03/22 01/12/23  Nahser, Deloris Ping, MD  nitroGLYCERIN (NITROSTAT) 0.4 MG SL tablet Take one every  five minutes under your tongue for up to 3 doses if you have onset of  shortness of breath. 08/25/21   Harris, Cammy Copa, PA-C  Polyethyl Glycol-Propyl Glycol (SYSTANE) 0.4-0.3 % SOLN Apply 1 drop to eye 4 (four) times daily.    [provider]  polyethylene glycol (MIRALAX / GLYCOLAX) 17 g packet Take 17 g by mouth at bedtime.    [provider]  prednisoLONE acetate (PRED FORTE) 1 % ophthalmic suspension 1 DROP IN BOTH EYES EVERY OTHER DAY 07/27/21   Rankin, Alford Highland, MD  vitamin B-12 (CYANOCOBALAMIN) 1000 MCG tablet Take 1,000 mcg by mouth daily.    [provider]      Allergies    Sulfa antibiotics    Review of Systems   Review of Systems  Gastrointestinal:  Negative for abdominal pain.  Genitourinary:  Positive for dysuria.  Musculoskeletal:  Positive for back pain and myalgias.  Neurological:  Negative for weakness and numbness.    Physical Exam Updated Vital Signs BP (!) 144/67   Pulse 66   Temp 97.8 F (36.6 C) (Oral)   Resp (!) 23   Ht 5\' 4"  (1.626 m)   Wt 52.2 kg   SpO2 96%   BMI 19.74 kg/m  Physical Exam Vitals and nursing note reviewed.  Constitutional:      Appearance: She is  well-developed.  HENT:     Head: Normocephalic and atraumatic.  Cardiovascular:     Rate and Rhythm: Normal rate and regular rhythm.     Pulses:          Dorsalis pedis pulses are 2+ on the right side and 2+ on the left side.     Heart sounds: Normal heart sounds.  Pulmonary:     Effort: Pulmonary effort is normal.     Breath sounds: Normal breath sounds.  Abdominal:     General: There is no distension.     Palpations: Abdomen is soft.     Tenderness: There is no abdominal tenderness.  Musculoskeletal:     Lumbar back: Tenderness present.     Right hip: No tenderness. Normal range of motion.     Left hip: No tenderness. Normal range of motion.     Right knee: Normal range of motion. No tenderness.     Left knee: Normal range of motion. No tenderness.  Skin:     General: Skin is warm and dry.  Neurological:     Mental Status: She is alert.     Comments: Grossly normal sensation in bilateral legs. Equal strength in both legs without significant weakness.      ED Results / Procedures / Treatments   Labs (all labs ordered are listed, but only abnormal results are displayed) Labs Reviewed  URINALYSIS, W/ REFLEX TO CULTURE (INFECTION SUSPECTED) - Abnormal; Notable for the following components:      Result Value   Bacteria, UA RARE (*)    All other components within normal limits  COMPREHENSIVE METABOLIC PANEL - Abnormal; Notable for the following components:   Sodium 132 (*)    Glucose, Bld 108 (*)    Creatinine, Ser 1.03 (*)    Albumin 3.0 (*)    GFR, Estimated 51 (*)    All other components within normal limits  CBC WITH DIFFERENTIAL/PLATELET - Abnormal; Notable for the following components:   RBC 3.57 (*)    Hemoglobin 11.6 (*)    HCT 35.2 (*)    Platelets 423 (*)    All other components within normal limits  CK - Abnormal; Notable for the following components:   Total CK 31 (*)    All other components within normal limits    EKG None  Radiology DG Lumbar Spine Complete Result Date: 05/11/2023 CLINICAL DATA:  back/hip pain.  Patient unable to walk. EXAM: LUMBAR SPINE - COMPLETE 4+ VIEW COMPARISON:  None Available. FINDINGS: There are 5 nonrib-bearing lumbar vertebrae. Anatomic lumbar curvature. No spondylolysis or spondylolisthesis. Vertebral body heights are maintained. No aggressive osseous lesion. Mild multilevel degenerative changes in the form of facet arthropathy and marginal osteophyte formation. Sacroiliac joints are symmetric. Visualized soft tissues are within normal limits. IMPRESSION: No acute osseous abnormality of the lumbar spine. Electronically Signed   By: Jules Schick M.D.   On: 05/11/2023 14:36   DG HIP UNILAT WITH PELVIS 2-3 VIEWS LEFT Result Date: 05/11/2023 CLINICAL DATA:  811914 Pain 144615. Low back pain  radiating to legs. Inability to walk. EXAM: DG HIP (WITH OR WITHOUT PELVIS) 2-3V RIGHT; DG HIP (WITH OR WITHOUT PELVIS) 2-3V LEFT COMPARISON:  None Available. FINDINGS: Pelvis is intact with normal and symmetric sacroiliac joints. No acute fracture or dislocation. No aggressive osseous lesion. Visualized sacral arcuate lines are unremarkable. Unremarkable symphysis pubis. There are mild degenerative changes of bilateral hip joints without significant joint space narrowing. Osteophytosis of the superior acetabulum. No radiopaque  foreign bodies. IMPRESSION: No acute osseous abnormality of bilateral hips. Electronically Signed   By: Jules Schick M.D.   On: 05/11/2023 14:34   DG HIP UNILAT WITH PELVIS 2-3 VIEWS RIGHT Result Date: 05/11/2023 CLINICAL DATA:  [id number] Pain 144615. Low back pain radiating to legs. Inability to walk. EXAM: DG HIP (WITH OR WITHOUT PELVIS) 2-3V RIGHT; DG HIP (WITH OR WITHOUT PELVIS) 2-3V LEFT COMPARISON:  None Available. FINDINGS: Pelvis is intact with normal and symmetric sacroiliac joints. No acute fracture or dislocation. No aggressive osseous lesion. Visualized sacral arcuate lines are unremarkable. Unremarkable symphysis pubis. There are mild degenerative changes of bilateral hip joints without significant joint space narrowing. Osteophytosis of the superior acetabulum. No radiopaque foreign bodies. IMPRESSION: No acute osseous abnormality of bilateral hips. Electronically Signed   By: Jules Schick M.D.   On: 05/11/2023 14:34    Procedures Procedures    Medications Ordered in ED Medications  lidocaine (LIDODERM) 5 % 1 patch (has no administration in time range)  sodium chloride 0.9 % bolus 500 mL (500 mLs Intravenous New Bag/Given 05/11/23 1308)  morphine (PF) 2 MG/ML injection 2 mg (2 mg Intravenous Given 05/11/23 1355)    ED Course/ Medical Decision Making/ A&P                                 Medical Decision Making Amount and/or Complexity of Data Reviewed Labs:  ordered.    Details: No rhabdomyolysis Radiology: ordered and independent interpretation performed.    Details: No fracture  Risk Prescription drug management.   Patient is feeling a lot better.  At first she declined meds but then did want something stronger for pain and was given a small dose of morphine.  This seems to be more of an acute on chronic issue.  No trauma, systemic symptoms, etc.  UA is unremarkable.  Plan is for discharge as she is feeling better and is motivated to go home.  We will test her ambulation to make sure she is still able to walk without significant enough pain but assuming this and I think she can be discharged.  Discussed that a lot of medicines to help treat back pain could cause significant side effects so we will hold off on narcotics and muscle relaxer for now and try Lidoderm in addition to Tylenol.  Care transferred to Dr. Elayne Snare.        Final Clinical Impression(s) / ED Diagnoses Final diagnoses:  None    Rx / DC Orders ED Discharge Orders     None         Pricilla Loveless, MD 05/11/23 1530

## 2023-05-11 NOTE — ED Provider Notes (Signed)
  Physical Exam  BP (!) 175/88 (BP Location: Right Arm)   Pulse 72   Temp 97.8 F (36.6 C)   Resp 14   Ht 5\' 4"  (1.626 m)   Wt 52.2 kg   SpO2 100%   BMI 19.74 kg/m   Physical Exam Vitals and nursing note reviewed.  HENT:     Head: Normocephalic and atraumatic.  Eyes:     Pupils: Pupils are equal, round, and reactive to light.  Cardiovascular:     Rate and Rhythm: Normal rate and regular rhythm.  Pulmonary:     Effort: Pulmonary effort is normal.     Breath sounds: Normal breath sounds.  Abdominal:     Palpations: Abdomen is soft.     Tenderness: There is no abdominal tenderness.  Skin:    General: Skin is warm and dry.  Neurological:     Mental Status: She is alert.  Psychiatric:        Mood and Affect: Mood normal.     Procedures  Procedures  ED Course / MDM   Clinical Course as of 05/15/23 0713  Thu May 11, 2023  1747 Laboratory workup unremarkable overall.  No traumatic findings on imaging.  Patient was able to ambulate with steady gait to the bathroom.  She feels well enough to go home will follow-up with her PCP.  Discussed findings and plan with her daughter at bedside [MP]    Clinical Course User Index [MP] Royanne Foots, DO   Medical Decision Making Amount and/or Complexity of Data Reviewed Labs: ordered. Radiology: ordered.  Risk Prescription drug management.          Royanne Foots, DO 05/15/23 [phone]

## 2023-05-11 NOTE — ED Provider Notes (Signed)
  Physical Exam  BP (!) 144/67   Pulse 66   Temp 97.8 F (36.6 C) (Oral)   Resp (!) 23   Ht 5\' 4"  (1.626 m)   Wt 52.2 kg   SpO2 96%   BMI 19.74 kg/m   Physical Exam Vitals and nursing note reviewed.  HENT:     Head: Normocephalic and atraumatic.  Eyes:     Pupils: Pupils are equal, round, and reactive to light.  Cardiovascular:     Rate and Rhythm: Normal rate and regular rhythm.  Pulmonary:     Effort: Pulmonary effort is normal.     Breath sounds: Normal breath sounds.  Abdominal:     Palpations: Abdomen is soft.     Tenderness: There is no abdominal tenderness.  Skin:    General: Skin is warm and dry.  Neurological:     Mental Status: She is alert.  Psychiatric:        Mood and Affect: Mood normal.     Procedures  Procedures  ED Course / MDM   Clinical Course as of 05/11/23 1747  Thu May 11, 2023  1747 Laboratory workup unremarkable overall.  No traumatic findings on imaging.  Patient was able to ambulate with steady gait to the bathroom.  She feels well enough to go home will follow-up with her PCP.  Discussed findings and plan with her daughter at bedside [MP]    Clinical Course User Index [MP] Royanne Foots, DO   Medical Decision Making I, Estelle June DO, have assumed care of this patient from the previous provider pending ambulatory trial, reevaluation and disposition  Amount and/or Complexity of Data Reviewed Labs: ordered. Radiology: ordered.  Risk Prescription drug management.          Royanne Foots, DO 05/11/23 1747

## 2023-05-11 NOTE — Discharge Instructions (Addendum)
If you develop worsening, recurrent, or continued back pain, numbness or weakness in the legs, incontinence of your bowels or bladders, numbness of your buttocks, fever, abdominal pain, or any other new/concerning symptoms then return to the ER for evaluation.  

## 2023-05-11 NOTE — ED Triage Notes (Signed)
 Pt BIB EMS from home. C/O back pain since Sunday and started to have spasms today. Hx of tachycardia. Pt unable to walk. Denies bladder/bowel incontinence.

## 2023-05-27 ENCOUNTER — Emergency Department (HOSPITAL_COMMUNITY)
Admission: EM | Admit: 2023-05-27 | Discharge: 2023-05-27 | Disposition: A | Discharge status: Home / Self Care | Attending: Emergency Medicine | Admitting: Emergency Medicine

## 2023-05-27 ENCOUNTER — Other Ambulatory Visit: Payer: Self-pay

## 2023-05-27 ENCOUNTER — Encounter (HOSPITAL_COMMUNITY): Payer: Self-pay | Admitting: *Deleted

## 2023-05-27 ENCOUNTER — Emergency Department (HOSPITAL_COMMUNITY)

## 2023-05-27 DIAGNOSIS — R42 Dizziness and giddiness: Secondary | ICD-10-CM | POA: Diagnosis not present

## 2023-05-27 DIAGNOSIS — Z7982 Long term (current) use of aspirin: Secondary | ICD-10-CM | POA: Insufficient documentation

## 2023-05-27 DIAGNOSIS — I10 Essential (primary) hypertension: Secondary | ICD-10-CM | POA: Insufficient documentation

## 2023-05-27 DIAGNOSIS — Z79899 Other long term (current) drug therapy: Secondary | ICD-10-CM | POA: Insufficient documentation

## 2023-05-27 LAB — CBC WITH DIFFERENTIAL/PLATELET
Abs Immature Granulocytes: 0.02 10*3/uL (ref 0.00–0.07)
Basophils Absolute: 0.1 10*3/uL (ref 0.0–0.1)
Basophils Relative: 1 %
Eosinophils Absolute: 0.2 10*3/uL (ref 0.0–0.5)
Eosinophils Relative: 3 %
HCT: 36.7 % (ref 36.0–46.0)
Hemoglobin: 12.1 g/dL (ref 12.0–15.0)
Immature Granulocytes: 0 %
Lymphocytes Relative: 28 %
Lymphs Abs: 1.9 10*3/uL (ref 0.7–4.0)
MCH: 32.2 pg (ref 26.0–34.0)
MCHC: 33 g/dL (ref 30.0–36.0)
MCV: 97.6 fL (ref 80.0–100.0)
Monocytes Absolute: 0.5 10*3/uL (ref 0.1–1.0)
Monocytes Relative: 7 %
Neutro Abs: 4.2 10*3/uL (ref 1.7–7.7)
Neutrophils Relative %: 61 %
Platelets: 276 10*3/uL (ref 150–400)
RBC: 3.76 MIL/uL — ABNORMAL LOW (ref 3.87–5.11)
RDW: 15 % (ref 11.5–15.5)
WBC: 6.9 10*3/uL (ref 4.0–10.5)
nRBC: 0 % (ref 0.0–0.2)

## 2023-05-27 LAB — COMPREHENSIVE METABOLIC PANEL WITH GFR
ALT: 17 U/L (ref 0–44)
AST: 34 U/L (ref 15–41)
Albumin: 3.3 g/dL — ABNORMAL LOW (ref 3.5–5.0)
Alkaline Phosphatase: 107 U/L (ref 38–126)
Anion gap: 9 (ref 5–15)
BUN: 9 mg/dL (ref 8–23)
CO2: 25 mmol/L (ref 22–32)
Calcium: 9.7 mg/dL (ref 8.9–10.3)
Chloride: 104 mmol/L (ref 98–111)
Creatinine, Ser: 1.08 mg/dL — ABNORMAL HIGH (ref 0.44–1.00)
GFR, Estimated: 48 mL/min — ABNORMAL LOW (ref >60)
Glucose, Bld: 106 mg/dL — ABNORMAL HIGH (ref 70–99)
Potassium: 4.1 mmol/L (ref 3.5–5.1)
Sodium: 138 mmol/L (ref 135–145)
Total Bilirubin: 1.3 mg/dL — ABNORMAL HIGH (ref 0.0–1.2)
Total Protein: 6.8 g/dL (ref 6.5–8.1)

## 2023-05-27 NOTE — ED Triage Notes (Signed)
 PT here from home via [facility] for intermittent headache, blurred vision and dizziness for several weeks.  Also low po intake (lost 12 lbs in 3 weeks per ems).  Pt has been taking her po meds.

## 2023-05-27 NOTE — ED Provider Notes (Signed)
 [place] EMERGENCY DEPARTMENT AT [facility] Provider Note   CSN: [id number] Arrival date & time: 05/27/23  1848     History {Add pertinent medical, surgical, social history, OB history to HPI:1} Chief Complaint  Patient presents with   Headache   Hypertension    Anne Bradshaw is a 89 y.o. female.   Headache Hypertension Associated symptoms include headaches.       Home Medications Prior to Admission medications   Medication Sig Start Date End Date Taking? Authorizing Provider  acetaminophen (TYLENOL) 325 MG tablet Take 650 mg by mouth daily as needed for mild pain.    [provider]  amiodarone (PACERONE) 200 MG tablet Take 1 tablet (200 mg total) by mouth every other day. 12/15/22   Jeanella Craze, NP  amLODipine (NORVASC) 2.5 MG tablet Take 1 tablet (2.5 mg total) by mouth daily. 11/01/22   Pricilla Riffle, MD  aspirin EC 81 MG tablet Take 1 tablet (81 mg total) by mouth daily. Swallow whole. 08/25/21   Arthor Captain, PA-C  Cholecalciferol (VITAMIN D3) 250 MCG (10000 UT) capsule Take 10,000 Units by mouth daily. Pt states 200 mg daily    [provider]  famciclovir (FAMVIR) 250 MG tablet Take 250 mg by mouth daily. 11/21/22   [provider]  levothyroxine (SYNTHROID) 50 MCG tablet Take 50 mcg by mouth daily. 01/10/23   [provider]  lidocaine (LIDODERM) 5 % Place 1 patch onto the skin daily. Remove & Discard patch within 12 hours or as directed by MD 05/11/23   Pricilla Loveless, MD  losartan (COZAAR) 25 MG tablet Take 1 tablet (25 mg total) by mouth daily. Take in addition to the 50 mg tablet to equal a dose of 75 mg daily 10/21/22   Sharlene Dory, PA-C  losartan (COZAAR) 50 MG tablet Take 1 tablet (50 mg total) by mouth daily. 10/03/22 01/12/23  Nahser, Deloris Ping, MD  nitroGLYCERIN (NITROSTAT) 0.4 MG SL tablet Take one every five minutes under your tongue for up to 3 doses if you have onset of  shortness of breath. 08/25/21    Harris, Cammy Copa, PA-C  Polyethyl Glycol-Propyl Glycol (SYSTANE) 0.4-0.3 % SOLN Apply 1 drop to eye 4 (four) times daily.    [provider]  polyethylene glycol (MIRALAX / GLYCOLAX) 17 g packet Take 17 g by mouth at bedtime.    [provider]  prednisoLONE acetate (PRED FORTE) 1 % ophthalmic suspension 1 DROP IN BOTH EYES EVERY OTHER DAY 07/27/21   Rankin, Alford Highland, MD  vitamin B-12 (CYANOCOBALAMIN) 1000 MCG tablet Take 1,000 mcg by mouth daily.    [provider]      Allergies    Sulfa antibiotics    Review of Systems   Review of Systems  Neurological:  Positive for headaches.    Physical Exam Updated Vital Signs BP (!) 159/91 (BP Location: Right Arm)   Pulse 69   Temp 97.8 F (36.6 C) (Oral)   Resp 16   Ht 5\' 4"  (1.626 m)   Wt 53.2 kg   SpO2 99%   BMI 20.12 kg/m  Physical Exam  ED Results / Procedures / Treatments   Labs (all labs ordered are listed, but only abnormal results are displayed) Labs Reviewed  COMPREHENSIVE METABOLIC PANEL WITH GFR  CBC WITH DIFFERENTIAL/PLATELET    EKG EKG Interpretation Date/Time:  Saturday May 27 2023 19:02:03 EDT Ventricular Rate:  65 PR Interval:  161 QRS Duration:  91  QT Interval:  421 QTC Calculation: 438 R Axis:   -60  Text Interpretation: Sinus rhythm Left anterior fascicular block Abnormal R-wave progression, early transition Consider anterior infarct Minimal ST depression, anterolateral leads Confirmed by Kristine Royal [phone]) on 05/27/2023 7:04:32 PM  Radiology No results found.  Procedures Procedures  {Document cardiac monitor, telemetry assessment procedure when appropriate:1}  Medications Ordered in ED Medications - No data to display  ED Course/ Medical Decision Making/ A&P   {   Click here for ABCD2, HEART and other calculatorsREFRESH Note before signing :1}                              Medical Decision Making Amount and/or Complexity of Data Reviewed Labs: ordered. Radiology:  ordered.   ***  {Document critical care time when appropriate:1} {Document review of labs and clinical decision tools ie heart score, Chads2Vasc2 etc:1}  {Document your independent review of radiology images, and any outside records:1} {Document your discussion with family members, caretakers, and with consultants:1} {Document social determinants of health affecting pt's care:1} {Document your decision making why or why not admission, treatments were needed:1} Final Clinical Impression(s) / ED Diagnoses Final diagnoses:  None    Rx / DC Orders ED Discharge Orders     None

## 2023-05-27 NOTE — Discharge Instructions (Addendum)
 Return for any problem.  ?

## 2023-06-14 NOTE — Progress Notes (Signed)
 [facility] Summary Report / Loop Recorder

## 2023-06-15 ENCOUNTER — Telehealth: Payer: Self-pay | Admitting: Cardiovascular Disease

## 2023-06-15 NOTE — Telephone Encounter (Signed)
 Patient reports of feeling better at this time. Presenting rhythm is NSR. No episodes. Note increase in night HR trends/daily HR's.   Patient reports march 1st she had the flu and had increased pain in hips ~ was taking pain medication and has recently completed. Patient reports feeling back to herself and other than the flu, no other sickness. Denies issues sleeping at night.  Per Brandi's last OV note 08/2022, pt should f/u with EP APP in 6 months. No apt is noted.   Patient advised to call if symptoms increase and ED precautions given for syncope or if she feels she needs to be seen urgently. Pt voiced understanding.

## 2023-06-15 NOTE — Telephone Encounter (Signed)
 Pt is requesting cb to see if her device is showing anything abn. She is having hard time describing how she is feeling stating that she is just not feeling right but when she holds her head down the sensation will subside, some feeling jittery and like heart is beating too fast. Does not have a way to check her HR  Please route to Device Clinic Pool

## 2023-07-09 NOTE — Progress Notes (Signed)
  Electrophysiology Office Note:   Date:  07/10/2023  ID:  Anne Bradshaw, DOB 04-Oct-1927, MRN [id number]  Primary Cardiologist: Antoinette Batman, MD Primary Heart Failure: None Electrophysiologist: None      History of Present Illness:   Anne Bradshaw is a 89 y.o. female with h/o SVT, HTN, GERD seen today for routine electrophysiology followup.   Last seen in July 2024 with reports of dizziness in the setting of SVT.  She had a loop recorder placed for monitoring but had significant pain from the site. She initially thought she wanted to remove the Loop but then decided to keep it as it was not bothering her that much and she didn't want to go through having it removed.   Since last being seen in our clinic the patient reports she has had a rough few months but is better now. She had the flu at the end of March, followed by back/hip pain associated with coughing so much from the flu, then she had a GI illness in April.  She feels she has just recovered from all of these things & is feeling better.    She denies chest pain, palpitations, dyspnea, PND, orthopnea, nausea, vomiting, dizziness, syncope, edema, weight gain, or early satiety.   Review of systems complete and found to be negative unless listed in HPI.   EP Information / Studies Reviewed:    EKG is not ordered today. EKG from 05/29/23 reviewed which showed SR 65 bpm      Studies:  ECHO 08/25/2022 LVEF 60-65%, no RWMA, RV systolic function normal, trivial MVR, no evidence of stenosis, mild calcification of aortic valve    Arrhythmia / AAD SVT   Device History: Water quality scientist implanted 08/26/2022 for syncope and SVT  Physical Exam:   VS:  BP (!) 165/67   Pulse 60   Ht 5\' 4"  (1.626 m)   SpO2 97%   BMI 20.12 kg/m    Wt Readings from Last 3 Encounters:  05/27/23 117 lb 3.3 oz (53.2 kg)  05/11/23 115 lb (52.2 kg)  02/27/23 112 lb (50.8 kg)     GEN: thin, elderly female, well nourished, well  developed in no acute distress NECK: No JVD; No carotid bruits CARDIAC: Regular rate and rhythm, no murmurs, rubs, gallops RESPIRATORY:  Clear to auscultation without rales, wheezing or rhonchi  ABDOMEN: Soft, non-tender, non-distended EXTREMITIES:  trace edema; No deformity   ASSESSMENT AND PLAN:    SVT  Syncope s/p [facility] Loop Recorder  Loop implanted 08/26/22 after episodes of SVT 170's.  -ILR reviewed, no events on monitor / no episodes   -previously expressed desire to have loop removed but has decided to keep it -continue amiodarone  200 mg every other day (pt does not think she can physically cut them in half) -hold beta blocker  -amiodarone  labs > recent CMP with nml LFT's. F/u TSH with PCP / on synthroid.   Hypertension  -BP elevated in clinic, she reports SBP 130-140 at home -PCP recently increased amlodipine  to 5mg , does have trace LE swelling on exam, monitor for now -previously had dizziness & LE swelling on amlodipine   -compression stockings  -continue losartan    Follow up with Dr. Marven Slimmer in 12 months & PRN  Signed, Creighton Doffing, NP-C, AGACNP-BC [facility] - Electrophysiology  07/10/2023, 12:14 PM

## 2023-07-10 ENCOUNTER — Ambulatory Visit (INDEPENDENT_AMBULATORY_CARE_PROVIDER_SITE_OTHER)

## 2023-07-10 ENCOUNTER — Ambulatory Visit: Attending: Pulmonary Disease | Admitting: Pulmonary Disease

## 2023-07-10 ENCOUNTER — Encounter: Payer: Self-pay | Admitting: Pulmonary Disease

## 2023-07-10 VITALS — BP 165/67 | HR 60 | Ht 64.0 in

## 2023-07-10 DIAGNOSIS — R55 Syncope and collapse: Secondary | ICD-10-CM

## 2023-07-10 DIAGNOSIS — I471 Supraventricular tachycardia, unspecified: Secondary | ICD-10-CM | POA: Diagnosis not present

## 2023-07-10 DIAGNOSIS — I10 Essential (primary) hypertension: Secondary | ICD-10-CM | POA: Insufficient documentation

## 2023-07-10 LAB — CUP PACEART REMOTE DEVICE CHECK
Date Time Interrogation Session: 20250519002300
Pulse Gen Serial Number: 107973

## 2023-07-10 NOTE — Patient Instructions (Signed)
 Medication Instructions:  Your physician recommends that you continue on your current medications as directed. Please refer to the Current Medication list given to you today.  *If you need a refill on your cardiac medications before your next appointment, please call your pharmacy*  Lab Work: None ordered If you have labs (blood work) drawn today and your tests are completely normal, you will receive your results only by: MyChart Message (if you have MyChart) OR A paper copy in the mail If you have any lab test that is abnormal or we need to change your treatment, we will call you to review the results.  Follow-Up: At [facility], you and your health needs are our priority.  As part of our continuing mission to provide you with exceptional heart care, our providers are all part of one team.  This team includes your primary Cardiologist (physician) and Advanced Practice Providers or APPs (Physician Assistants and Nurse Practitioners) who all work together to provide you with the care you need, when you need it.  Your next appointment:   1 year(s)  Provider:   You will see one of the following Advanced Practice Providers on your designated Care Team:   Mertha Abrahams, [place] Joycelyn Noa" [place], [place] Creighton Doffing, NP  We recommend signing up for the patient portal called "MyChart".  Sign up information is provided on this After Visit Summary.  MyChart is used to connect with patients for Virtual Visits (Telemedicine).  Patients are able to view lab/test results, encounter notes, upcoming appointments, etc.  Non-urgent messages can be sent to your provider as well.   To learn more about what you can do with MyChart, go to [email or web address].

## 2023-07-11 ENCOUNTER — Encounter: Admitting: Pulmonary Disease

## 2023-07-11 ENCOUNTER — Ambulatory Visit: Payer: Self-pay | Admitting: Cardiology

## 2023-08-10 ENCOUNTER — Ambulatory Visit (INDEPENDENT_AMBULATORY_CARE_PROVIDER_SITE_OTHER)

## 2023-08-10 DIAGNOSIS — R55 Syncope and collapse: Secondary | ICD-10-CM

## 2023-08-14 ENCOUNTER — Encounter

## 2023-08-14 ENCOUNTER — Ambulatory Visit: Payer: Self-pay | Admitting: Cardiology

## 2023-08-14 LAB — CUP PACEART REMOTE DEVICE CHECK
Date Time Interrogation Session: 20250623022800
Pulse Gen Serial Number: 107973

## 2023-08-18 ENCOUNTER — Encounter

## 2023-08-28 NOTE — Progress Notes (Signed)
 Bsx Loop Recorder

## 2023-09-04 ENCOUNTER — Telehealth: Payer: Self-pay

## 2023-09-04 NOTE — Telephone Encounter (Signed)
 Alert remote transmission:  Tachy 1 new tachy event, HR 169-170, EGM c/w NCT.  Event occurred 7/12 @ 15:42 - route to triage Presenting NSR  LMTRC to assess pt symptoms. _____________________________________________________________________

## 2023-09-05 NOTE — Telephone Encounter (Signed)
 Attempted outreach made to patient's home phone. No answer. LMTCB.

## 2023-09-05 NOTE — Telephone Encounter (Signed)
 Patient called back to clinic and denied experiencing any symptoms during event on 09/02/23  Patient confirmed that she is compliant with all of her prescribed medications  All questions answered   Patient appreciative of phone call

## 2023-09-07 ENCOUNTER — Other Ambulatory Visit: Payer: Self-pay | Admitting: Cardiovascular Disease

## 2023-09-11 ENCOUNTER — Other Ambulatory Visit: Payer: Self-pay

## 2023-09-11 ENCOUNTER — Ambulatory Visit

## 2023-09-11 DIAGNOSIS — R55 Syncope and collapse: Secondary | ICD-10-CM

## 2023-09-11 MED ORDER — LOSARTAN POTASSIUM 25 MG PO TABS: 25.0000 mg | ORAL_TABLET | Freq: Every day | ORAL | 1 refills | Status: DC

## 2023-09-12 LAB — CUP PACEART REMOTE DEVICE CHECK
Date Time Interrogation Session: 20250721023200
Pulse Gen Serial Number: 107973

## 2023-09-14 ENCOUNTER — Ambulatory Visit: Payer: Self-pay | Admitting: Cardiology

## 2023-09-18 ENCOUNTER — Encounter

## 2023-09-20 ENCOUNTER — Encounter: Payer: Self-pay | Admitting: Cardiovascular Disease

## 2023-09-20 ENCOUNTER — Ambulatory Visit: Attending: Cardiovascular Disease | Admitting: Cardiovascular Disease

## 2023-09-20 VITALS — BP 170/60 | HR 62 | Ht 64.0 in | Wt 107.0 lb

## 2023-09-20 DIAGNOSIS — I471 Supraventricular tachycardia, unspecified: Secondary | ICD-10-CM | POA: Insufficient documentation

## 2023-09-20 DIAGNOSIS — I10 Essential (primary) hypertension: Secondary | ICD-10-CM | POA: Diagnosis present

## 2023-09-20 NOTE — Progress Notes (Signed)
 Chief Complaint  Patient presents with   Follow-up    SVT   History of Present Illness: 89 yo female with history of GERD, SVT and HTN who is here today for follow up. Echo August 2023 with LVEF=60-65%, no significant valve disease. She was hospitalized in July 2024 with dizziness and near syncope and was found to have SVT requiring adenosine to convert to sinus. She was started on amiodarone . Loop recorder implanted 08/26/22. She did not tolerate Norvasc  10 mg daily secondary to LE edema. She was seen in the EP clinic 07/10/23 and had no events noted on her loop recorder. Plan to continue amiodarone . One event on loop recorder 09/02/23 but she was asymptomatic.   She is here today for follow up. The patient denies any chest pain, dyspnea, palpitations, lower extremity edema, orthopnea, PND, dizziness, near syncope or syncope.   Primary Care Physician: Leonel Cole, MD  Past Medical History:  Diagnosis Date   Asthma    as child   GERD (gastroesophageal reflux disease)    High blood pressure    Iritis    Reflux    SOB (shortness of breath)     Past Surgical History:  Procedure Laterality Date   LOOP RECORDER INSERTION N/A 08/26/2022   Procedure: LOOP RECORDER INSERTION;  Surgeon: Cindie Ole DASEN, MD;  Location: [facility];  Service: Cardiovascular;  Laterality: N/A;   LYMPH NODE BIOPSY Right 07/29/2014   Procedure: RIGHT INGUINAL LYMPH NODE BIOPSY;  Surgeon: Jina Nephew, MD;  Location: [facility];  Service: General;  Laterality: Right;   NO PAST SURGERIES     SKIN BIOPSY Right 07/29/2014   Procedure: SKIN PUNCH BIOPSY;  Surgeon: Jina Nephew, MD;  Location: [facility];  Service: General;  Laterality: Right;    Current Outpatient Medications  Medication Sig Dispense Refill   acetaminophen  (TYLENOL ) 325 MG tablet Take 650 mg by mouth daily as needed for mild pain.     amiodarone  (PACERONE ) 200 MG tablet Take 1 tablet (200 mg total) by mouth every  other day. 45 tablet 3   amLODipine  (NORVASC ) 5 MG tablet Take 5 mg by mouth daily. Changed by PCP / Dr. Leonel     Cholecalciferol  (VITAMIN D3) 250 MCG (10000 UT) capsule Take 10,000 Units by mouth daily. Pt states 200 mg daily     famciclovir  (FAMVIR ) 250 MG tablet Take 250 mg by mouth daily.     levothyroxine (SYNTHROID) 50 MCG tablet Take 75 mcg by mouth daily.     lidocaine  (LIDODERM ) 5 % Place 1 patch onto the skin daily. Remove & Discard patch within 12 hours or as directed by MD 9 patch 0   losartan  (COZAAR ) 25 MG tablet Take 1 tablet (25 mg total) by mouth daily. Take in addition to the 50 mg tablet to equal a dose of 75 mg daily 90 tablet 1   losartan  (COZAAR ) 50 MG tablet TAKE 1 TABLET BY MOUTH EVERY DAY 90 tablet 1   nitroGLYCERIN  (NITROSTAT ) 0.4 MG SL tablet Take one every five minutes under your tongue for up to 3 doses if you have onset of  shortness of breath. 30 tablet 0   Polyethyl Glycol-Propyl Glycol (SYSTANE) 0.4-0.3 % SOLN Apply 1 drop to eye 4 (four) times daily.     polyethylene glycol (MIRALAX  / GLYCOLAX ) 17 g packet Take 17 g by mouth at bedtime.     prednisoLONE  acetate (PRED FORTE ) 1 % ophthalmic suspension 1 DROP IN  BOTH EYES EVERY OTHER DAY 5 mL 6   vitamin B-12 (CYANOCOBALAMIN ) 1000 MCG tablet Take 1,000 mcg by mouth daily.     No current facility-administered medications for this visit.    Allergies  Allergen Reactions   Sulfa Antibiotics     Social History   Socioeconomic History   Marital status: Married    Spouse name: Not on file   Number of children: 2   Years of education: Not on file   Highest education level: Not on file  Occupational History   Occupation: Retired  Tobacco Use   Smoking status: Never   Smokeless tobacco: Never  Vaping Use   Vaping status: Never Used  Substance and Sexual Activity   Alcohol  use: No    Alcohol /week: 0.0 standard drinks of alcohol    Drug use: No   Sexual activity: Not on file  Other Topics Concern   Not  on file  Social History Narrative   Not on file   Social Drivers of Health   Financial Resource Strain: Not on file  Food Insecurity: No Food Insecurity (08/24/2022)   Hunger Vital Sign    Worried About Running Out of Food in the Last Year: Never true    Ran Out of Food in the Last Year: Never true  Transportation Needs: No Transportation Needs (08/24/2022)   PRAPARE - Administrator, Civil Service (Medical): No    Lack of Transportation (Non-Medical): No  Physical Activity: Not on file  Stress: Not on file  Social Connections: Not on file  Intimate Partner Violence: Not At Risk (08/24/2022)   Humiliation, Afraid, Rape, and Kick questionnaire    Fear of Current or Ex-Partner: No    Emotionally Abused: No    Physically Abused: No    Sexually Abused: No    Family History  Problem Relation Age of Onset   Congestive Heart Failure Father    Congestive Heart Failure Brother    Congestive Heart Failure Paternal Grandmother     Review of Systems:  As stated in the HPI and otherwise negative.   BP (!) 170/60   Pulse 62   Ht 5' 4 (1.626 m)   Wt 107 lb (48.5 kg)   SpO2 95%   BMI 18.37 kg/m   Physical Examination: General: Well developed, well nourished, NAD  HEENT: OP clear, mucus membranes moist  SKIN: warm, dry. No rashes. Neuro: No focal deficits  Musculoskeletal: Muscle strength 5/5 all ext  Psychiatric: Mood and affect normal  Neck: No JVD, no carotid bruits, no thyromegaly, no lymphadenopathy.  Lungs:Clear bilaterally, no wheezes, rhonci, crackles Cardiovascular: Regular rate and rhythm. Soft systolic murmur.  Abdomen:Soft. Bowel sounds present. Non-tender.  Extremities: No lower extremity edema. Pulses are 2 + in the bilateral DP/PT.  EKG:  EKG is not ordered today. The ekg ordered today demonstrates   Recent Labs: 12/13/2022: TSH 9.220 05/27/2023: ALT 17; BUN 9; Creatinine, Ser 1.08; Hemoglobin 12.1; Platelets 276; Potassium 4.1; Sodium 138   Lipid  Panel No results found for: CHOL, TRIG, HDL, CHOLHDL, VLDL, LDLCALC, LDLDIRECT   Wt Readings from Last 3 Encounters:  09/20/23 107 lb (48.5 kg)  05/27/23 117 lb 3.3 oz (53.2 kg)  05/11/23 115 lb (52.2 kg)    Assessment and Plan:   1.SVT: No palpitations. One event on loop recorder on 09/02/23. Continue amiodarone . Beta blocker has been stopped.   2. HTN: BP is controlled at home with systolic blood pressures between 114-126. No changes  Labs/  tests ordered today include:  No orders of the defined types were placed in this encounter.  Disposition:   F/U with me as needed. She can follow with the EP team  Signed, Lonni Cash, MD 09/20/2023 4:16 PM    [facility] [address] [place], [place], [place]  [address] Phone: [phone]; Fax: [phone]

## 2023-09-20 NOTE — Patient Instructions (Signed)
 Medication Instructions:  No changes *If you need a refill on your cardiac medications before your next appointment, please call your pharmacy*  Lab Work: none If you have labs (blood work) drawn today and your tests are completely normal, you will receive your results only by: MyChart Message (if you have MyChart) OR A paper copy in the mail If you have any lab test that is abnormal or we need to change your treatment, we will call you to review the results.  Testing/Procedures: none  Follow-Up: At [facility], you and your health needs are our priority.  As part of our continuing mission to provide you with exceptional heart care, our providers are all part of one team.  This team includes your primary Cardiologist (physician) and Advanced Practice Providers or APPs (Physician Assistants and Nurse Practitioners) who all work together to provide you with the care you need, when you need it.  Your next appointment:   12 month(s)  Provider:   Lonni Cash, MD    We recommend signing up for the patient portal called MyChart.  Sign up information is provided on this After Visit Summary.  MyChart is used to connect with patients for Virtual Visits (Telemedicine).  Patients are able to view lab/test results, encounter notes, upcoming appointments, etc.  Non-urgent messages can be sent to your provider as well.   To learn more about what you can do with MyChart, go to [email or web address].

## 2023-09-22 ENCOUNTER — Encounter

## 2023-10-12 ENCOUNTER — Ambulatory Visit (INDEPENDENT_AMBULATORY_CARE_PROVIDER_SITE_OTHER)

## 2023-10-12 DIAGNOSIS — R55 Syncope and collapse: Secondary | ICD-10-CM

## 2023-10-13 LAB — CUP PACEART REMOTE DEVICE CHECK
Date Time Interrogation Session: 20250822013500
Pulse Gen Serial Number: 107973

## 2023-10-16 ENCOUNTER — Ambulatory Visit: Payer: Self-pay | Admitting: Cardiology

## 2023-10-16 NOTE — Addendum Note (Signed)
 Addended by: VICCI SELLER A on: 10/16/2023 10:27 AM   Modules accepted: Orders

## 2023-10-16 NOTE — Progress Notes (Signed)
 [facility] Summary Report / Loop Recorder

## 2023-10-23 ENCOUNTER — Encounter

## 2023-10-27 ENCOUNTER — Encounter

## 2023-11-13 ENCOUNTER — Ambulatory Visit (INDEPENDENT_AMBULATORY_CARE_PROVIDER_SITE_OTHER)

## 2023-11-13 DIAGNOSIS — R55 Syncope and collapse: Secondary | ICD-10-CM

## 2023-11-14 NOTE — Progress Notes (Signed)
 Remote Loop Recorder Transmission

## 2023-11-16 LAB — CUP PACEART REMOTE DEVICE CHECK
Date Time Interrogation Session: 20250924150100
Pulse Gen Serial Number: 107973

## 2023-11-16 NOTE — Progress Notes (Signed)
 Remote Loop Recorder Transmission

## 2023-11-17 ENCOUNTER — Ambulatory Visit: Payer: Self-pay | Admitting: Cardiology

## 2023-11-27 ENCOUNTER — Encounter

## 2023-11-27 NOTE — Progress Notes (Signed)
 Remote Loop Recorder Transmission

## 2023-12-01 ENCOUNTER — Encounter

## 2023-12-14 ENCOUNTER — Encounter

## 2023-12-19 ENCOUNTER — Telehealth: Payer: Self-pay

## 2023-12-19 NOTE — Telephone Encounter (Signed)
 Spoke with pt tried to help her with her monitor. I have let pt know that I will have joey give her a call to help her before we replace it

## 2023-12-20 ENCOUNTER — Ambulatory Visit: Attending: Cardiology

## 2023-12-20 DIAGNOSIS — R55 Syncope and collapse: Secondary | ICD-10-CM | POA: Diagnosis not present

## 2023-12-21 ENCOUNTER — Ambulatory Visit: Payer: Self-pay | Admitting: Cardiology

## 2023-12-21 LAB — CUP PACEART REMOTE DEVICE CHECK
Date Time Interrogation Session: 20251029100400
Pulse Gen Serial Number: 107973

## 2023-12-27 NOTE — Progress Notes (Signed)
 Remote Loop Recorder Transmission

## 2023-12-27 NOTE — Telephone Encounter (Signed)
 Pt monitor is working again

## 2024-01-01 ENCOUNTER — Encounter

## 2024-01-05 ENCOUNTER — Encounter

## 2024-01-08 ENCOUNTER — Telehealth: Payer: Self-pay | Admitting: Cardiology

## 2024-01-08 NOTE — Telephone Encounter (Signed)
 Called patient to discuss symptoms  Patient reports feeling okay now  No new episodes noted on Latitude   Patient informed there were no new episodes since last scheduled transmission last month   Patient concerned that her LX monitor not working properly  Told patient they may send a manual transmission if they like and we would review it  Patient had difficulty sending transmission and required three way call with tech support to send  Device working effectively and connected to patient according to tech support  Transmission received and reviewed  Clear p waves present  EGM c/w sinus rhythm with rare PAC's that progressed to sinus tachycardia then back to sinus with a max HR of ~108bpm  AF =  0%  Patient's device setting for Tachy events set at 170 bpm  This RN will reprogram parameter to 115 bpm to better suit patient age and also improve alert triggers for this patient if happens again  Patient instructed to call clinic back if they need help sending transmission in future  All questions and concerns addressed at this time   Patient very appreciative of phone call and assistance with tech support by this RN

## 2024-01-08 NOTE — Telephone Encounter (Signed)
  STAT if HR is under 50 or over 120  (normal HR is 60-100 beats per minute)  What is your heart rate? Doesn't know but says it feels like it has been beating faster since Sunday afternoon  Do you have a log of your heart rate readings (document readings)? Has not taken her bp or hr  Do you have any other symptoms? Says she felt like she was going to pass out yesterday at the store. States that her head feels funny today and she feels weak. States she is feeling anxious which could be making it worse. Scared she will pass out

## 2024-01-08 NOTE — Telephone Encounter (Signed)
 Patient reports that yesterday she had an episode where she could feel her heart racing. She felt lightheaded. She had to sit down because she was afraid that she would pass out. She was not able to take her heart rate or blood pressure during the episode. She states that it eventually resolved on its on. She feels fine today other than being a bit swimmy headed. She reports that she normally feels like that after she has these episodes. She reports that she drinks plenty of fluids and had eaten yesterday. Patient has a loop recorder - will send to device to review report.

## 2024-01-09 NOTE — Telephone Encounter (Signed)
 Device clinic spoke with pt.

## 2024-01-11 ENCOUNTER — Telehealth: Payer: Self-pay

## 2024-01-11 NOTE — Telephone Encounter (Signed)
 Called and spoke with patient regarding alert transmission and to assess for symptoms  Patient stated they were sitting in their recliner and could feel their heart racing at time of episode but stated they did not feel lightheaded or dizzy at all  Patient confirmed compliancy with her prescribed amiodarone  dose  Patient instructed to use her symptom activator if she begins to experience any syncopal signs or symptoms and patient verbalized understanding  Will route to Dr. Cindie for awareness and any advisement if necessary   Patient appreciative of the call

## 2024-01-11 NOTE — Telephone Encounter (Signed)
 Alert received:  Alert remote transmission: Tachy Event occurred 11/19 @ 21:25, duration 18sec, HR's 146-150, EGM c/w NCT   Will contact Pt to evaluate for any symptoms.

## 2024-01-16 NOTE — Telephone Encounter (Signed)
 Spoke with the patient and scheduled her for an appointment on 12/9.

## 2024-01-20 ENCOUNTER — Ambulatory Visit: Attending: Cardiology

## 2024-01-20 DIAGNOSIS — I471 Supraventricular tachycardia, unspecified: Secondary | ICD-10-CM

## 2024-01-21 ENCOUNTER — Emergency Department (HOSPITAL_COMMUNITY)
Admission: EM | Admit: 2024-01-21 | Discharge: 2024-01-21 | Disposition: A | Discharge status: Home / Self Care | Attending: Emergency Medicine | Admitting: Emergency Medicine

## 2024-01-21 ENCOUNTER — Emergency Department (HOSPITAL_COMMUNITY)

## 2024-01-21 ENCOUNTER — Encounter (HOSPITAL_COMMUNITY): Payer: Self-pay

## 2024-01-21 DIAGNOSIS — M25512 Pain in left shoulder: Secondary | ICD-10-CM | POA: Diagnosis present

## 2024-01-21 DIAGNOSIS — R42 Dizziness and giddiness: Secondary | ICD-10-CM | POA: Diagnosis not present

## 2024-01-21 DIAGNOSIS — W19XXXA Unspecified fall, initial encounter: Secondary | ICD-10-CM

## 2024-01-21 DIAGNOSIS — I10 Essential (primary) hypertension: Secondary | ICD-10-CM | POA: Diagnosis not present

## 2024-01-21 DIAGNOSIS — Y92091 Bathroom in other non-institutional residence as the place of occurrence of the external cause: Secondary | ICD-10-CM | POA: Diagnosis not present

## 2024-01-21 DIAGNOSIS — W1830XA Fall on same level, unspecified, initial encounter: Secondary | ICD-10-CM | POA: Insufficient documentation

## 2024-01-21 DIAGNOSIS — Y9301 Activity, walking, marching and hiking: Secondary | ICD-10-CM | POA: Insufficient documentation

## 2024-01-21 DIAGNOSIS — M25572 Pain in left ankle and joints of left foot: Secondary | ICD-10-CM | POA: Diagnosis not present

## 2024-01-21 DIAGNOSIS — R0781 Pleurodynia: Secondary | ICD-10-CM | POA: Diagnosis not present

## 2024-01-21 LAB — CBC WITH DIFFERENTIAL/PLATELET
Abs Immature Granulocytes: 0.01 K/uL (ref 0.00–0.07)
Basophils Absolute: 0.1 K/uL (ref 0.0–0.1)
Basophils Relative: 1 %
Eosinophils Absolute: 0.2 K/uL (ref 0.0–0.5)
Eosinophils Relative: 4 %
HCT: 39.5 % (ref 36.0–46.0)
Hemoglobin: 13 g/dL (ref 12.0–15.0)
Immature Granulocytes: 0 %
Lymphocytes Relative: 28 %
Lymphs Abs: 1.4 K/uL (ref 0.7–4.0)
MCH: 33 pg (ref 26.0–34.0)
MCHC: 32.9 g/dL (ref 30.0–36.0)
MCV: 100.3 fL — ABNORMAL HIGH (ref 80.0–100.0)
Monocytes Absolute: 0.4 K/uL (ref 0.1–1.0)
Monocytes Relative: 9 %
Neutro Abs: 2.9 K/uL (ref 1.7–7.7)
Neutrophils Relative %: 58 %
Platelets: 187 K/uL (ref 150–400)
RBC: 3.94 MIL/uL (ref 3.87–5.11)
RDW: 13.7 % (ref 11.5–15.5)
WBC: 5.1 K/uL (ref 4.0–10.5)
nRBC: 0 % (ref 0.0–0.2)

## 2024-01-21 LAB — I-STAT CHEM 8, ED
BUN: 13 mg/dL (ref 8–23)
Calcium, Ion: 1.13 mmol/L — ABNORMAL LOW (ref 1.15–1.40)
Chloride: 103 mmol/L (ref 98–111)
Creatinine, Ser: 1 mg/dL (ref 0.44–1.00)
Glucose, Bld: 109 mg/dL — ABNORMAL HIGH (ref 70–99)
HCT: 40 % (ref 36.0–46.0)
Hemoglobin: 13.6 g/dL (ref 12.0–15.0)
Potassium: 4.2 mmol/L (ref 3.5–5.1)
Sodium: 136 mmol/L (ref 135–145)
TCO2: 22 mmol/L (ref 22–32)

## 2024-01-21 LAB — BASIC METABOLIC PANEL WITH GFR
Anion gap: 11 (ref 5–15)
BUN: 12 mg/dL (ref 8–23)
CO2: 21 mmol/L — ABNORMAL LOW (ref 22–32)
Calcium: 9.1 mg/dL (ref 8.9–10.3)
Chloride: 101 mmol/L (ref 98–111)
Creatinine, Ser: 1.05 mg/dL — ABNORMAL HIGH (ref 0.44–1.00)
GFR, Estimated: 49 mL/min — ABNORMAL LOW (ref >60)
Glucose, Bld: 108 mg/dL — ABNORMAL HIGH (ref 70–99)
Potassium: 4.1 mmol/L (ref 3.5–5.1)
Sodium: 133 mmol/L — ABNORMAL LOW (ref 135–145)

## 2024-01-21 LAB — MAGNESIUM: Magnesium: 2 mg/dL (ref 1.7–2.4)

## 2024-01-21 NOTE — ED Triage Notes (Signed)
 BIB EMS from home r/t Fall while ambulating in bathroom. Dizziness prior to fall. Endorsing posterior head and left ankle pain. Denies blood thinners. (-) LOC. A&Ox4.

## 2024-01-21 NOTE — Discharge Instructions (Signed)
 You were seen for your fall in the emergency department.  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  At home, please use Tylenol  for your pain and ice your shoulder and ankle to limit the swelling.    Check your MyChart online for the results of any tests that had not resulted by the time you left the emergency department.   Follow-up with your primary doctor in 2-3 days regarding your visit.  Please talk to them about the emphysema and old strokes that were seen on your CT.  Return immediately to the emergency department if you experience any of the following: Severe headache, vomiting, dizziness, or any other concerning symptoms.    Thank you for visiting our Emergency Department. It was a pleasure taking care of you today.

## 2024-01-21 NOTE — ED Provider Notes (Signed)
[facility] EMERGENCY DEPARTMENT AT [facility] Provider Note   CSN: [id number] Arrival date & time: 01/21/24  9248     Patient presents with: [place]   Anne Bradshaw is a 89 y.o. female.   89 year old female history of SVT status post [employer] loop recorder, hypertension, and GERD who presented to the emergency department after a fall.  Says that she was walking with her walker in the restroom and turned and fell to the ground.  Landed on her left side.  Did hit her head.  No loss of consciousness.  Was on the ground for 5 minutes.  Is complaining of pain in her left shoulder and ankle as well as the ribs on her left side.  Initially told triage that she had some dizziness prior to falling but denies this to me.  Denies any palpitations but says that when she is in SVT she often times does not feel palpitations.       Prior to Admission medications   Medication Sig Start Date End Date Taking? Authorizing Provider  acetaminophen  (TYLENOL ) 325 MG tablet Take 650 mg by mouth daily as needed for mild pain.    [provider]  amiodarone  (PACERONE ) 200 MG tablet Take 1 tablet (200 mg total) by mouth every other day. 12/15/22   Aniceto Daphne CROME, NP  amLODipine  (NORVASC ) 5 MG tablet Take 5 mg by mouth daily. Changed by PCP / Dr. Leonel    [provider]  Cholecalciferol  (VITAMIN D3) 250 MCG (10000 UT) capsule Take 10,000 Units by mouth daily. Pt states 200 mg daily    [provider]  famciclovir  (FAMVIR ) 250 MG tablet Take 250 mg by mouth daily. 11/21/22   [provider]  levothyroxine (SYNTHROID) 50 MCG tablet Take 75 mcg by mouth daily. 01/10/23   [provider]  lidocaine  (LIDODERM ) 5 % Place 1 patch onto the skin daily. Remove & Discard patch within 12 hours or as directed by MD 05/11/23   Freddi Hamilton, MD  losartan  (COZAAR ) 25 MG tablet Take 1 tablet (25 mg total) by mouth daily. Take in addition to the 50 mg tablet to  equal a dose of 75 mg daily 09/11/23   Verlin Lonni BIRCH, MD  losartan  (COZAAR ) 50 MG tablet TAKE 1 TABLET BY MOUTH EVERY DAY 09/07/23   Verlin Lonni BIRCH, MD  nitroGLYCERIN  (NITROSTAT ) 0.4 MG SL tablet Take one every five minutes under your tongue for up to 3 doses if you have onset of  shortness of breath. 08/25/21   Harris, Abigail, PA-C  Polyethyl Glycol-Propyl Glycol (SYSTANE) 0.4-0.3 % SOLN Apply 1 drop to eye 4 (four) times daily.    [provider]  polyethylene glycol (MIRALAX  / GLYCOLAX ) 17 g packet Take 17 g by mouth at bedtime.    [provider]  prednisoLONE  acetate (PRED FORTE ) 1 % ophthalmic suspension 1 DROP IN BOTH EYES EVERY OTHER DAY 07/27/21   Rankin, Arley LABOR, MD  vitamin B-12 (CYANOCOBALAMIN ) 1000 MCG tablet Take 1,000 mcg by mouth daily.    [provider]    Allergies: Sulfa antibiotics    Review of Systems  Updated Vital Signs BP (!) 157/69   Pulse 64   Temp 98 F (36.7 C) (Oral)   Resp 18   Ht 5' 4.8 (1.646 m)   Wt 47.6 kg   SpO2 100%   BMI 17.58 kg/m   Physical Exam Vitals and nursing note reviewed.  Constitutional:  General: She is not in acute distress.    Appearance: She is well-developed.  HENT:     Head: Normocephalic and atraumatic.     Right Ear: External ear normal.     Left Ear: External ear normal.     Nose: Nose normal.  Eyes:     Extraocular Movements: Extraocular movements intact.     Conjunctiva/sclera: Conjunctivae normal.     Pupils: Pupils are equal, round, and reactive to light.  Neck:     Comments: No C-spine midline tenderness palpation. Cardiovascular:     Rate and Rhythm: Normal rate and regular rhythm.     Heart sounds: No murmur heard. Pulmonary:     Effort: Pulmonary effort is normal. No respiratory distress.     Breath sounds: Normal breath sounds.  Abdominal:     General: Abdomen is flat. There is no distension.     Palpations: Abdomen is soft. There is no mass.     Tenderness:  There is no abdominal tenderness. There is no guarding.  Musculoskeletal:     Cervical back: Normal range of motion and neck supple.     Right lower leg: No edema.     Left lower leg: No edema.     Comments: Tenderness to palpation of left shoulder, left lateral malleolus, and left ribs.  No bruising or deformities of these areas.  No tenderness palpation of shoulders, elbows, wrists, knees, or ankles otherwise.  Skin:    General: Skin is warm and dry.  Neurological:     Mental Status: She is alert and oriented to person, place, and time. Mental status is at baseline.     Cranial Nerves: No cranial nerve deficit.     Sensory: No sensory deficit.     Motor: No weakness.  Psychiatric:        Mood and Affect: Mood normal.     (all labs ordered are listed, but only abnormal results are displayed) Labs Reviewed  CBC WITH DIFFERENTIAL/PLATELET - Abnormal; Notable for the following components:      Result Value   MCV 100.3 (*)    All other components within normal limits  BASIC METABOLIC PANEL WITH GFR - Abnormal; Notable for the following components:   Sodium 133 (*)    CO2 21 (*)    Glucose, Bld 108 (*)    Creatinine, Ser 1.05 (*)    GFR, Estimated 49 (*)    All other components within normal limits  I-STAT CHEM 8, ED - Abnormal; Notable for the following components:   Glucose, Bld 109 (*)    Calcium, Ion 1.13 (*)    All other components within normal limits  MAGNESIUM     EKG: EKG Interpretation Date/Time:  Sunday January 21 2024 08:00:22 EST Ventricular Rate:  63 PR Interval:  164 QRS Duration:  95 QT Interval:  432 QTC Calculation: 443 R Axis:   -39  Text Interpretation: Sinus rhythm Left axis deviation Abnormal R-wave progression, early transition Confirmed by Yolande Charleston [phone]) on 01/21/2024 8:02:31 AM  Radiology: ARCOLA Ribs Unilateral W/Chest Left Result Date: 01/21/2024 CLINICAL DATA:  Fall.  Left chest wall pain. EXAM: LEFT RIBS AND CHEST - 3+ VIEW  COMPARISON:  05/27/2023.  CT, 06/23/2010. FINDINGS: No fracture. No bone lesion. Skeletal structures are diffusely demineralized. Cardiac silhouette is normal in size. No mediastinal or hilar masses. Lungs are hyperexpanded with chronic interstitial thickening and apical scarring. Lungs otherwise clear. No pleural effusion or pneumothorax. IMPRESSION: 1. No rib fracture or rib  lesion. 2. No acute cardiopulmonary disease. Electronically Signed   By: Alm Parkins M.D.   On: 01/21/2024 10:22   DG Ankle Complete Left Result Date: 01/21/2024 CLINICAL DATA:  Fall.  Left ankle injury and pain. EXAM: LEFT ANKLE COMPLETE - 3+ VIEW COMPARISON:  None Available. FINDINGS: There is no evidence of fracture, dislocation, or joint effusion. There is no evidence of arthropathy or other focal bone abnormality. Generalized osteopenia noted. Soft tissues are unremarkable. IMPRESSION: No acute findings. Osteopenia. Electronically Signed   By: Norleen DELENA Kil M.D.   On: 01/21/2024 10:21   DG Shoulder Left Result Date: 01/21/2024 CLINICAL DATA:  Fall.  Left shoulder pain. EXAM: LEFT SHOULDER - 2+ VIEW COMPARISON:  05/10/2019. FINDINGS: No fracture.  No bone lesion. Glenohumeral and AC joints are normally aligned. Mild AC joint osteoarthritis. Skeletal structures are diffusely demineralized. IMPRESSION: No fracture or dislocation Electronically Signed   By: Alm Parkins M.D.   On: 01/21/2024 10:20   CT Cervical Spine Wo Contrast Result Date: 01/21/2024 EXAM: CT CERVICAL SPINE WITHOUT CONTRAST 01/21/2024 08:51:57 AM TECHNIQUE: CT of the cervical spine was performed without the administration of intravenous contrast. Multiplanar reformatted images are provided for review. Automated exposure control, iterative reconstruction, and/or weight based adjustment of the mA/kV was utilized to reduce the radiation dose to as low as reasonably achievable. COMPARISON: CT of the cervical spine dated 08/24/2022. CLINICAL HISTORY: fall fall  FINDINGS: CERVICAL SPINE: BONES AND ALIGNMENT: No acute fracture or traumatic malalignment. There is slight degenerative anterolisthesis at C3-C4. DEGENERATIVE CHANGES: There is mild chronic degenerative disc disease at C4-C5 and C5-C6, with posterior ridging resulting in mild central spinal canal stenosis at both levels. There is also moderate bilateral neuroforaminal stenosis at C5-C6, which is worse on the left. SOFT TISSUES: No prevertebral soft tissue swelling. There is moderate calcification of the carotid bulbs bilaterally. LUNGS: There are patchy areas of consolidation/scarring present posteriorly within the lung apices bilaterally. There are also mild emphysematous changes present. IMPRESSION: 1. No acute abnormality of the cervical spine related to the fall. 2. Mild chronic degenerative disc disease at C4-5 and C5-6 with mild central spinal canal stenosis at both levels and moderate bilateral neuroforaminal stenosis at C5-6, worse on the left. 3. Incidental mild emphysema; in patients aged 88, consider evaluation for low-dose CT lung cancer screening program, as emphysema is an independent risk factor for lung cancer. Electronically signed by: Evalene Coho MD 01/21/2024 09:27 AM EST RP Workstation: [id number]   CT Head Wo Contrast Result Date: 01/21/2024 EXAM: CT HEAD WITHOUT CONTRAST 01/21/2024 08:51:57 AM TECHNIQUE: CT of the head was performed without the administration of intravenous contrast. Automated exposure control, iterative reconstruction, and/or weight based adjustment of the mA/kV was utilized to reduce the radiation dose to as low as reasonably achievable. COMPARISON: CT of the head dated 05/27/2023. CLINICAL HISTORY: Head trauma, minor (Age >= 65y). FINDINGS: BRAIN AND VENTRICLES: No acute hemorrhage. No evidence of acute infarct. Moderate periventricular and deep cerebral white matter disease. There is a chronic lacunar infarct within the genu of the left internal capsule. No  hydrocephalus. No extra-axial collection. No mass effect or midline shift. Moderate calcific atheromatous disease within the carotid siphons. ORBITS: No acute abnormality. The patient is status post bilateral lens replacement. SINUSES: No acute abnormality. SOFT TISSUES AND SKULL: No acute soft tissue abnormality. No skull fracture. IMPRESSION: 1. No acute intracranial abnormality related to the minor head trauma. 2. Moderate periventricular and deep cerebral white matter disease. 3. Chronic lacunar infarct  within the genu of the left internal capsule. 4. Moderate calcific atheromatous disease within the carotid siphons. Electronically signed by: Evalene Coho MD 01/21/2024 09:15 AM EST RP Workstation: [id number]     Procedures   Medications Ordered in the ED - No data to display  Clinical Course as of 01/21/24 1043  Sun Jan 21, 2024  1013 Interrogation report received from [employer].  Did have 1 episode of SVT for 14 seconds on January 19, 2024 with a heart rate of 136 bpm.  No other arrhythmias.  No events noted today. [RP]    Clinical Course User Index [RP] Yolande Lamar BROCKS, MD                                 Medical Decision Making Amount and/or Complexity of Data Reviewed Labs: ordered. Radiology: ordered.   Anne Bradshaw is a 89 year old female history of SVT status post [employer] loop recorder, hypertension, and GERD who presented to the emergency department after a fall.  Initial Ddx:  Mechanical fall, syncope, TBI, C-spine injury, rib fracture, shoulder fracture, ankle fracture  MDM/Course:  Patient presents emergency department after a fall.  Initially was reporting some dizziness but denies this to me.  She is overall well-appearing without any obvious signs of trauma externally.  Nonfocal neurologic exam.  Had a CT head and C-spine that did not show acute abnormality but did show an old stroke that the patient and her family were informed of.  She had  x-rays of her left shoulder, left ribs, and left ankle that did not reveal acute abnormality.  She had blood work that was reassuring.  Loop recorder was interrogated and showed an event 2 days ago of SVT with a max heart rate of 136.  Patient reports that she was asymptomatic at that point in time.  No events noted today.  Upon re-evaluation patient remained asymptomatic.  Discharged home with instructions to follow-up with her primary doctor about the old strokes that were seen on her CT as well as the emphysema that was noted on her C-spine CT  This patient presents to the ED for concern of complaints listed in HPI, this involves an extensive number of treatment options, and is a complaint that carries with it a high risk of complications and morbidity. Disposition including potential need for admission considered.   Dispo: DC Home. Return precautions discussed including, but not limited to, those listed in the AVS. Allowed pt time to ask questions which were answered fully prior to dc.  Additional history obtained from daughter Records reviewed Outpatient Clinic Notes The following labs were independently interpreted: Chemistry and show no acute abnormality I independently reviewed the following imaging with scope of interpretation limited to determining acute life threatening conditions related to emergency care: CT Head and agree with the radiologist interpretation with the following exceptions: none I personally reviewed and interpreted cardiac monitoring: normal sinus rhythm  I personally reviewed and interpreted the pt's EKG: see above for interpretation  I have reviewed the patients home medications and made adjustments as needed Social Determinants of health:  Geriatric  Portions of this note were generated with Scientist, clinical (histocompatibility and immunogenetics). Dictation errors may occur despite best attempts at proofreading.     Final diagnoses:  Fall, initial encounter    ED Discharge Orders     None           Yolande Lamar BROCKS, MD  01/21/24 1043  

## 2024-01-22 LAB — CUP PACEART REMOTE DEVICE CHECK
Date Time Interrogation Session: 20251129020500
Pulse Gen Serial Number: 107973

## 2024-01-23 ENCOUNTER — Ambulatory Visit: Payer: Self-pay | Admitting: Cardiology

## 2024-01-24 NOTE — Progress Notes (Signed)
 Remote Loop Recorder Transmission

## 2024-01-29 ENCOUNTER — Other Ambulatory Visit: Payer: Self-pay | Admitting: Family Medicine

## 2024-01-29 ENCOUNTER — Ambulatory Visit
Admission: RE | Admit: 2024-01-29 | Discharge: 2024-01-29 | Disposition: A | Source: Ambulatory Visit | Attending: Family Medicine | Admitting: Family Medicine

## 2024-01-29 DIAGNOSIS — M79672 Pain in left foot: Secondary | ICD-10-CM

## 2024-01-29 LAB — LAB REPORT - SCANNED: EGFR: 49

## 2024-01-30 ENCOUNTER — Ambulatory Visit: Attending: Student | Admitting: Student

## 2024-01-30 ENCOUNTER — Encounter: Payer: Self-pay | Admitting: Student

## 2024-01-30 VITALS — BP 158/72 | HR 59 | Ht 64.8 in | Wt 113.6 lb

## 2024-01-30 DIAGNOSIS — I10 Essential (primary) hypertension: Secondary | ICD-10-CM | POA: Diagnosis not present

## 2024-01-30 DIAGNOSIS — Z79899 Other long term (current) drug therapy: Secondary | ICD-10-CM | POA: Diagnosis not present

## 2024-01-30 DIAGNOSIS — I471 Supraventricular tachycardia, unspecified: Secondary | ICD-10-CM | POA: Diagnosis not present

## 2024-01-30 DIAGNOSIS — Z9189 Other specified personal risk factors, not elsewhere classified: Secondary | ICD-10-CM | POA: Diagnosis not present

## 2024-01-30 DIAGNOSIS — Z9889 Other specified postprocedural states: Secondary | ICD-10-CM

## 2024-01-30 NOTE — Patient Instructions (Addendum)
 Medication Instructions:  No medication changes today. *If you need a refill on your cardiac medications before your next appointment, please call your pharmacy*  Lab Work: No labwork ordered today. If you have labs (blood work) drawn today and your tests are completely normal, you will receive your results only by: MyChart Message (if you have MyChart) OR A paper copy in the mail If you have any lab test that is abnormal or we need to change your treatment, we will call you to review the results.  Testing/Procedures: No testing ordered today  Follow-Up: At [facility], you and your health needs are our priority.  As part of our continuing mission to provide you with exceptional heart care, our providers are all part of one team.  This team includes your primary Cardiologist (physician) and Advanced Practice Providers or APPs (Physician Assistants and Nurse Practitioners) who all work together to provide you with the care you need, when you need it.  Your next appointment:   6 month(s)  Provider:   You may see Ardeen Kohler, MD or one of the following Advanced Practice Providers on your designated Care Team:   Mertha Abrahams, Kennard Pea "Jonelle Neri" [place], PA-C Suzann Riddle, NP Creighton Doffing, NP    We recommend signing up for the patient portal called "MyChart".  Sign up information is provided on this After Visit Summary.  MyChart is used to connect with patients for Virtual Visits (Telemedicine).  Patients are able to view lab/test results, encounter notes, upcoming appointments, etc.  Non-urgent messages can be sent to your provider as well.   To learn more about what you can do with MyChart, go to [email or web address].

## 2024-01-30 NOTE — Progress Notes (Signed)
   Electrophysiology Office Note:   Date:  01/30/2024  ID:  Anne Bradshaw, DOB 10-May-1927, MRN [id number]  Primary Cardiologist: None Electrophysiologist: Fonda Kitty, MD      History of Present Illness:   Anne Bradshaw is a 89 y.o. female with h/o SVT, HTN, GERD  seen today for acute visit due to RVR on loop recorder.    Patient reports OK. She has not noted any palpitations recently.  Fell on 11/30 and Xray yesterday with foot fractures, but pt has not heard back yet.  Episodes on loop consistent with SVT asymptomatic, and one symptom episode marked showed NSR. Has had 2 falls in the past several months, no syncope.  Has questioned if amio is contributing to imbalance.   Review of systems complete and found to be negative unless listed in HPI.   Studies Reviewed:    EKG is not ordered today. EKG from 01/21/2024 reviewed which showed NSR at 63 bpm       Arrhythmia/Device History Water Quality Scientist implanted 08/26/2022 for syncope and SVT   Physical Exam:   VS:  BP (!) 158/72   Pulse (!) 59   Ht 5' 4.8 (1.646 m)   Wt 113 lb 9.6 oz (51.5 kg)   SpO2 96%   BMI 19.02 kg/m    Wt Readings from Last 3 Encounters:  01/30/24 113 lb 9.6 oz (51.5 kg)  01/21/24 105 lb (47.6 kg)  09/20/23 107 lb (48.5 kg)     GEN: No acute distress NECK: No JVD; No carotid bruits CARDIAC: Regular rate and rhythm, no murmurs, rubs, gallops RESPIRATORY:  Clear to auscultation without rales, wheezing or rhonchi  ABDOMEN: Soft, non-tender, non-distended EXTREMITIES:  No edema; No deformity   ASSESSMENT AND PLAN:    SVT Syncope s/p Environmental Manager Loop recorder Normal device function by website No changes today Continue amiodarone  200 mg every other day (pt can't physically cut them in half) Will request labs from PCP for surveillance. If TSH not drawn will need to update.   HTN Stable on current regimen   Fall Imbalance Foot Fracture Per PCP.  ? If amio is contributing  to imbalance. In shared decision making will continue for now as treatment for her SVT.   Follow up with EP Team in 6 months, sooner with cardiac issues.  Signed, Ozell Prentice Passey, PA-C am

## 2024-02-07 ENCOUNTER — Other Ambulatory Visit: Payer: Self-pay | Admitting: Pulmonary Disease

## 2024-02-07 MED ORDER — AMIODARONE HCL 200 MG PO TABS: 200.0000 mg | ORAL_TABLET | ORAL | 3 refills | Status: DC

## 2024-02-16 ENCOUNTER — Telehealth: Payer: Self-pay

## 2024-02-16 NOTE — Telephone Encounter (Addendum)
 Alert remote transmission: Tachy, recurrent tachy alerts. 1 new tachy event x 2 hr 29 min with V-rates 158 bpm.  NCT.  To triage for long duration.   Tachy detection is programmed at >115 bpm.  Consider increasing to 160 bpm to alert for actionable findings.  Patient does recall feeling very weak and dizzy the morning of this SVT event.  She had to have her husband help her around the house that morning because she was so weak. No further symptoms since and is doing well.   (Patient has hx of recent fall/fractured foot) No new medications, is taking her Amiodarone  every other day as instructed.   She was instructed to call our office if she has any more of these symptom events and let us  know, also ER precautions given if symptoms are severe.  Will forward to A. Tillery, PA-C who is out of the office today but did last see the patient on 01/30/24.  Patient aware will follow up next week.

## 2024-02-19 MED ORDER — AMIODARONE HCL 200 MG PO TABS: 200.0000 mg | ORAL_TABLET | Freq: Every day | ORAL | 3 refills | Status: AC

## 2024-02-19 NOTE — Telephone Encounter (Signed)
 Patient called to advise to take Amiodarone  200 daily. Patient voice understanding and agreeable to plan. Script sent to patient preferred pharmacy.

## 2024-02-20 ENCOUNTER — Ambulatory Visit: Attending: Cardiovascular Disease

## 2024-02-20 DIAGNOSIS — I471 Supraventricular tachycardia, unspecified: Secondary | ICD-10-CM | POA: Diagnosis not present

## 2024-02-21 ENCOUNTER — Ambulatory Visit: Payer: Self-pay | Admitting: Cardiovascular Disease

## 2024-02-21 LAB — CUP PACEART REMOTE DEVICE CHECK
Date Time Interrogation Session: 20251230013100
Pulse Gen Serial Number: 107973

## 2024-02-28 NOTE — Progress Notes (Signed)
 Remote Loop Recorder Transmission

## 2024-03-22 ENCOUNTER — Ambulatory Visit: Attending: Cardiovascular Disease

## 2024-03-22 DIAGNOSIS — R55 Syncope and collapse: Secondary | ICD-10-CM

## 2024-03-22 LAB — CUP PACEART REMOTE DEVICE CHECK
Date Time Interrogation Session: 20260130013500
Pulse Gen Serial Number: 107973

## 2024-03-26 ENCOUNTER — Telehealth: Payer: Self-pay

## 2024-03-26 NOTE — Telephone Encounter (Signed)
 No new events or symptom activations on [employer] since last scheduled transmission on 03/22/24  Patient verbalized understanding and appreciative of call back  Patient encouraged by this RN to use her symptom activator when she is having symptoms so we can review her transmissions for any anomalies  Patient verbalized understanding

## 2024-03-26 NOTE — Progress Notes (Signed)
 Remote Loop Recorder Transmission

## 2024-03-26 NOTE — Telephone Encounter (Signed)
 Pt called in stating that her hr was up on Sunday (03/24/2024) and she wanted to know if anything was seen. Pt would like a call back and will send a transmission. Pt has no symptoms just concerns

## 2024-03-27 ENCOUNTER — Other Ambulatory Visit: Payer: Self-pay | Admitting: Cardiovascular Disease

## 2024-04-22 ENCOUNTER — Ambulatory Visit

## 2024-05-23 ENCOUNTER — Ambulatory Visit

## 2024-06-23 ENCOUNTER — Ambulatory Visit

## 2024-07-24 ENCOUNTER — Ambulatory Visit

## 2024-08-24 ENCOUNTER — Ambulatory Visit

## 2024-09-24 ENCOUNTER — Ambulatory Visit
